# Patient Record
Sex: Female | Born: 1945
Health system: Southern US, Community
[De-identification: ages and names within clinical notes are randomized; demographics above are authoritative.]

## PROBLEM LIST (undated history)

## (undated) DIAGNOSIS — N814 Uterovaginal prolapse, unspecified: Principal | ICD-10-CM

## (undated) DIAGNOSIS — Z972 Presence of dental prosthetic device (complete) (partial): Secondary | ICD-10-CM

## (undated) DIAGNOSIS — C50919 Malignant neoplasm of unspecified site of unspecified female breast: Secondary | ICD-10-CM

## (undated) DIAGNOSIS — IMO0002 Reserved for concepts with insufficient information to code with codable children: Secondary | ICD-10-CM

## (undated) DIAGNOSIS — N393 Stress incontinence (female) (male): Secondary | ICD-10-CM

## (undated) DIAGNOSIS — Z974 Presence of external hearing-aid: Secondary | ICD-10-CM

## (undated) DIAGNOSIS — IMO0001 Reserved for inherently not codable concepts without codable children: Secondary | ICD-10-CM

## (undated) DIAGNOSIS — Z973 Presence of spectacles and contact lenses: Secondary | ICD-10-CM

## (undated) DIAGNOSIS — I10 Essential (primary) hypertension: Secondary | ICD-10-CM

## (undated) DIAGNOSIS — R339 Retention of urine, unspecified: Secondary | ICD-10-CM

## (undated) DIAGNOSIS — K649 Unspecified hemorrhoids: Secondary | ICD-10-CM

## (undated) DIAGNOSIS — K5730 Diverticulosis of large intestine without perforation or abscess without bleeding: Secondary | ICD-10-CM

## (undated) DIAGNOSIS — N3281 Overactive bladder: Secondary | ICD-10-CM

## (undated) DIAGNOSIS — R7303 Prediabetes: Secondary | ICD-10-CM

## (undated) DIAGNOSIS — C50511 Malignant neoplasm of lower-outer quadrant of right female breast: Principal | ICD-10-CM

## (undated) DIAGNOSIS — N950 Postmenopausal bleeding: Secondary | ICD-10-CM

## (undated) DIAGNOSIS — N182 Chronic kidney disease, stage 2 (mild): Secondary | ICD-10-CM

## (undated) DIAGNOSIS — E785 Hyperlipidemia, unspecified: Secondary | ICD-10-CM

## (undated) HISTORY — DX: Hypercalcemia: E83.52

## (undated) HISTORY — DX: Reserved for concepts with insufficient information to code with codable children: IMO0002

## (undated) HISTORY — DX: Reserved for inherently not codable concepts without codable children: IMO0001

## (undated) HISTORY — DX: Hyperlipidemia, unspecified: E78.5

## (undated) HISTORY — DX: Essential (primary) hypertension: I10

## (undated) HISTORY — DX: Uterovaginal prolapse, unspecified: N81.4

## (undated) HISTORY — DX: Malignant neoplasm of unspecified site of unspecified female breast: C50.919

## (undated) HISTORY — PX: TUBAL LIGATION: SHX77

## (undated) HISTORY — DX: Malignant neoplasm of lower-outer quadrant of right female breast: C50.511

---

## 1978-06-24 HISTORY — PX: BREAST SURGERY: SHX581

## 1998-12-05 ENCOUNTER — Other Ambulatory Visit: Admission: RE | Admit: 1998-12-05 | Discharge: 1998-12-05 | Payer: Self-pay | Admitting: Internal Medicine

## 1999-11-27 ENCOUNTER — Other Ambulatory Visit: Admission: RE | Admit: 1999-11-27 | Discharge: 1999-11-27 | Payer: Self-pay | Admitting: Internal Medicine

## 1999-11-29 ENCOUNTER — Encounter: Admission: RE | Admit: 1999-11-29 | Discharge: 1999-11-29 | Payer: Self-pay | Admitting: Internal Medicine

## 1999-11-29 ENCOUNTER — Encounter: Payer: Self-pay | Admitting: Internal Medicine

## 1999-12-03 ENCOUNTER — Ambulatory Visit (HOSPITAL_COMMUNITY): Admission: RE | Admit: 1999-12-03 | Discharge: 1999-12-03 | Payer: Self-pay | Admitting: Internal Medicine

## 2000-07-08 ENCOUNTER — Encounter: Admission: RE | Admit: 2000-07-08 | Discharge: 2000-10-06 | Payer: Self-pay | Admitting: Internal Medicine

## 2001-03-03 ENCOUNTER — Other Ambulatory Visit: Admission: RE | Admit: 2001-03-03 | Discharge: 2001-03-03 | Payer: Self-pay | Admitting: Internal Medicine

## 2003-01-25 ENCOUNTER — Encounter: Admission: RE | Admit: 2003-01-25 | Discharge: 2003-01-25 | Payer: Self-pay | Admitting: Family Medicine

## 2003-02-09 ENCOUNTER — Encounter: Admission: RE | Admit: 2003-02-09 | Discharge: 2003-02-09 | Payer: Self-pay | Admitting: Family Medicine

## 2003-02-09 ENCOUNTER — Encounter: Payer: Self-pay | Admitting: Family Medicine

## 2004-03-01 ENCOUNTER — Ambulatory Visit: Payer: Self-pay | Admitting: *Deleted

## 2004-05-11 ENCOUNTER — Ambulatory Visit: Payer: Self-pay | Admitting: Family Medicine

## 2004-07-03 ENCOUNTER — Ambulatory Visit: Payer: Self-pay | Admitting: Family Medicine

## 2006-04-16 ENCOUNTER — Ambulatory Visit: Payer: Self-pay | Admitting: Gastroenterology

## 2006-04-30 ENCOUNTER — Ambulatory Visit: Payer: Self-pay | Admitting: Gastroenterology

## 2012-06-23 ENCOUNTER — Telehealth: Payer: Self-pay | Admitting: Gastroenterology

## 2012-06-23 ENCOUNTER — Encounter: Payer: Self-pay | Admitting: Gastroenterology

## 2012-06-23 NOTE — Telephone Encounter (Signed)
Forward 8 pages from [facility] to Dr. Sheryn Bison for review on 06-23-12 ym

## 2012-07-14 ENCOUNTER — Encounter: Payer: Self-pay | Admitting: Gastroenterology

## 2012-07-14 ENCOUNTER — Ambulatory Visit (AMBULATORY_SURGERY_CENTER): Payer: BC Managed Care – PPO | Admitting: *Deleted

## 2012-07-14 VITALS — Ht 61.0 in | Wt 201.0 lb

## 2012-07-14 DIAGNOSIS — Z1211 Encounter for screening for malignant neoplasm of colon: Secondary | ICD-10-CM

## 2012-07-14 MED ORDER — MOVIPREP 100 G PO SOLR: ORAL | Status: DC

## 2012-07-27 ENCOUNTER — Ambulatory Visit (AMBULATORY_SURGERY_CENTER): Payer: BC Managed Care – PPO | Admitting: Gastroenterology

## 2012-07-27 ENCOUNTER — Encounter: Payer: Self-pay | Admitting: Gastroenterology

## 2012-07-27 VITALS — BP 148/85 | HR 63 | Temp 97.5°F | Resp 20 | Ht 61.0 in | Wt 201.0 lb

## 2012-07-27 DIAGNOSIS — K5730 Diverticulosis of large intestine without perforation or abscess without bleeding: Secondary | ICD-10-CM

## 2012-07-27 DIAGNOSIS — Z1211 Encounter for screening for malignant neoplasm of colon: Secondary | ICD-10-CM

## 2012-07-27 HISTORY — PX: COLONOSCOPY: SHX174

## 2012-07-27 MED ORDER — SODIUM CHLORIDE 0.9 % IV SOLN: 500.0000 mL | INTRAVENOUS | Status: DC

## 2012-07-27 NOTE — Patient Instructions (Addendum)
Discharge instructions given with verbal understanding. Handouts on diverticulosis and a high fiber diet given. Resume previous medications.YOU HAD AN ENDOSCOPIC PROCEDURE TODAY AT THE [facility]: Refer to the procedure report that was given to you for any specific questions about what was found during the examination.  If the procedure report does not answer your questions, please call your gastroenterologist to clarify.  If you requested that your care partner not be given the details of your procedure findings, then the procedure report has been included in a sealed envelope for you to review at your convenience later.  YOU SHOULD EXPECT: Some feelings of bloating in the abdomen. Passage of more gas than usual.  Walking can help get rid of the air that was put into your GI tract during the procedure and reduce the bloating. If you had a lower endoscopy (such as a colonoscopy or flexible sigmoidoscopy) you may notice spotting of blood in your stool or on the toilet paper. If you underwent a bowel prep for your procedure, then you may not have a normal bowel movement for a few days.  DIET: Your first meal following the procedure should be a light meal and then it is ok to progress to your normal diet.  A half-sandwich or bowl of soup is an example of a good first meal.  Heavy or fried foods are harder to digest and may make you feel nauseous or bloated.  Likewise meals heavy in dairy and vegetables can cause extra gas to form and this can also increase the bloating.  Drink plenty of fluids but you should avoid alcoholic beverages for 24 hours.  ACTIVITY: Your care partner should take you home directly after the procedure.  You should plan to take it easy, moving slowly for the rest of the day.  You can resume normal activity the day after the procedure however you should NOT DRIVE or use heavy machinery for 24 hours (because of the sedation medicines used during the test).    SYMPTOMS TO  REPORT IMMEDIATELY: A gastroenterologist can be reached at any hour.  During normal business hours, 8:30 AM to 5:00 PM Monday through Friday, call [phone].  After hours and on weekends, please call the GI answering service at [phone] who will take a message and have the physician on call contact you.   Following lower endoscopy (colonoscopy or flexible sigmoidoscopy):  Excessive amounts of blood in the stool  Significant tenderness or worsening of abdominal pains  Swelling of the abdomen that is new, acute  Fever of 100F or higher  FOLLOW UP: If any biopsies were taken you will be contacted by phone or by letter within the next 1-3 weeks.  Call your gastroenterologist if you have not heard about the biopsies in 3 weeks.  Our staff will call the home number listed on your records the next business day following your procedure to check on you and address any questions or concerns that you may have at that time regarding the information given to you following your procedure. This is a courtesy call and so if there is no answer at the home number and we have not heard from you through the emergency physician on call, we will assume that you have returned to your regular daily activities without incident.  SIGNATURES/CONFIDENTIALITY: You and/or your care partner have signed paperwork which will be entered into your electronic medical record.  These signatures attest to the fact that that the information above on your   After Visit Summary has been reviewed and is understood.  Full responsibility of the confidentiality of this discharge information lies with you and/or your care-partner.   

## 2012-07-27 NOTE — Progress Notes (Signed)
Patient did not experience any of the following events: a burn prior to discharge; a fall within the facility; wrong site/side/patient/procedure/implant event; or a hospital transfer or hospital admission upon discharge from the facility. (G8907) Patient did not have preoperative order for IV antibiotic SSI prophylaxis. (G8918)  

## 2012-07-27 NOTE — Op Note (Signed)
[facility] [address] [place], [address]   COLONOSCOPY PROCEDURE REPORT  PATIENT: Cynthia Barton, Cynthia Barton  MR#: [id number] BIRTHDATE: 1946/03/13 , 66  yrs. old GENDER: Female ENDOSCOPIST: Mardella Layman, MD, [facility] REFERRED BY: PROCEDURE DATE:  07/27/2012 PROCEDURE:   Colonoscopy, screening ASA CLASS:   Class II INDICATIONS:Average risk patient for colon cancer. MEDICATIONS: propofol (Diprivan) 150mg  IV  DESCRIPTION OF PROCEDURE:   After the risks and benefits and of the procedure were explained, informed consent was obtained.  A digital rectal exam revealed no abnormalities of the rectum.    The LB [id number]  endoscope was introduced through the anus and advanced to the cecum, which was identified by both the appendix and ileocecal valve .  The quality of the prep was excellent, using MoviPrep .  The instrument was then slowly withdrawn as the colon was fully examined.     COLON FINDINGS: Mild diverticulosis was noted in the descending colon and sigmoid colon.   The colon mucosa was otherwise normal. Retroflexed views revealed no abnormalities.     The scope was then withdrawn from the patient and the procedure completed.  COMPLICATIONS: There were no complications. ENDOSCOPIC IMPRESSION: 1.   Mild diverticulosis was noted in the descending colon and sigmoid colon 2.   The colon mucosa was otherwise normal...no polyps noted  RECOMMENDATIONS: 1.  Continue current medications 2.  High fiber diet 3.  Continue current colorectal screening recommendations for "routine risk" patients with a repeat colonoscopy in 10 years.   REPEAT EXAM:  XB:JYNWG Allyne Gee, MD  _______________________________ eSigned:  Mardella Layman, MD, [facility] 07/27/2012 10:41 AM

## 2012-07-28 ENCOUNTER — Telehealth: Payer: Self-pay | Admitting: *Deleted

## 2012-07-28 NOTE — Telephone Encounter (Signed)
  Follow up Call-  Call back number 07/27/2012  Post procedure Call Back phone  # [phone]  Permission to leave phone message Yes     Patient questions:  Message left to call us if necessary.

## 2014-06-24 DIAGNOSIS — N812 Incomplete uterovaginal prolapse: Secondary | ICD-10-CM

## 2014-06-24 DIAGNOSIS — N814 Uterovaginal prolapse, unspecified: Secondary | ICD-10-CM

## 2014-06-24 HISTORY — DX: Incomplete uterovaginal prolapse: N81.2

## 2014-06-24 HISTORY — DX: Uterovaginal prolapse, unspecified: N81.4

## 2014-08-23 DIAGNOSIS — C50511 Malignant neoplasm of lower-outer quadrant of right female breast: Secondary | ICD-10-CM

## 2014-08-23 HISTORY — DX: Malignant neoplasm of lower-outer quadrant of right female breast: C50.511

## 2014-09-01 ENCOUNTER — Other Ambulatory Visit: Payer: Self-pay | Admitting: Radiology

## 2014-09-02 ENCOUNTER — Other Ambulatory Visit: Payer: Self-pay | Admitting: Radiology

## 2014-09-02 DIAGNOSIS — C50911 Malignant neoplasm of unspecified site of right female breast: Secondary | ICD-10-CM

## 2014-09-06 ENCOUNTER — Encounter: Payer: Self-pay | Admitting: *Deleted

## 2014-09-06 ENCOUNTER — Telehealth: Payer: Self-pay | Admitting: *Deleted

## 2014-09-06 DIAGNOSIS — C50511 Malignant neoplasm of lower-outer quadrant of right female breast: Secondary | ICD-10-CM

## 2014-09-06 HISTORY — DX: Malignant neoplasm of lower-outer quadrant of right female breast: C50.511

## 2014-09-06 NOTE — Telephone Encounter (Signed)
Left vm for pt to return call to discuss [facility] on 3/16. [facility] gave pt appt time of 1230.

## 2014-09-07 ENCOUNTER — Encounter: Payer: Self-pay | Admitting: Hematology and Oncology

## 2014-09-07 ENCOUNTER — Ambulatory Visit
Admission: RE | Admit: 2014-09-07 | Discharge: 2014-09-07 | Disposition: A | Discharge status: Home / Self Care | Payer: BC Managed Care – PPO | Source: Ambulatory Visit | Attending: Radiation Oncology | Admitting: Radiation Oncology

## 2014-09-07 ENCOUNTER — Encounter: Payer: Self-pay | Admitting: Physical Therapy

## 2014-09-07 ENCOUNTER — Ambulatory Visit: Payer: BC Managed Care – PPO

## 2014-09-07 ENCOUNTER — Ambulatory Visit (HOSPITAL_BASED_OUTPATIENT_CLINIC_OR_DEPARTMENT_OTHER): Payer: BC Managed Care – PPO | Admitting: Hematology and Oncology

## 2014-09-07 ENCOUNTER — Other Ambulatory Visit: Payer: Self-pay | Admitting: General Surgery

## 2014-09-07 ENCOUNTER — Ambulatory Visit: Payer: BC Managed Care – PPO | Attending: General Surgery | Admitting: Physical Therapy

## 2014-09-07 ENCOUNTER — Other Ambulatory Visit (HOSPITAL_BASED_OUTPATIENT_CLINIC_OR_DEPARTMENT_OTHER): Payer: BC Managed Care – PPO

## 2014-09-07 VITALS — BP 155/81 | HR 69 | Temp 98.5°F | Resp 18 | Ht 59.5 in | Wt 195.8 lb

## 2014-09-07 DIAGNOSIS — Z170 Estrogen receptor positive status [ER+]: Secondary | ICD-10-CM

## 2014-09-07 DIAGNOSIS — Z803 Family history of malignant neoplasm of breast: Secondary | ICD-10-CM

## 2014-09-07 DIAGNOSIS — C50511 Malignant neoplasm of lower-outer quadrant of right female breast: Secondary | ICD-10-CM | POA: Insufficient documentation

## 2014-09-07 DIAGNOSIS — C50919 Malignant neoplasm of unspecified site of unspecified female breast: Secondary | ICD-10-CM

## 2014-09-07 DIAGNOSIS — R293 Abnormal posture: Secondary | ICD-10-CM

## 2014-09-07 HISTORY — DX: Malignant neoplasm of unspecified site of unspecified female breast: C50.919

## 2014-09-07 LAB — CBC WITH DIFFERENTIAL/PLATELET
BASO%: 0.4 % (ref 0.0–2.0)
BASOS ABS: 0 10*3/uL (ref 0.0–0.1)
EOS%: 0.8 % (ref 0.0–7.0)
Eosinophils Absolute: 0.1 10*3/uL (ref 0.0–0.5)
HEMATOCRIT: 41.4 % (ref 34.8–46.6)
HEMOGLOBIN: 13 g/dL (ref 11.6–15.9)
LYMPH#: 2.2 10*3/uL (ref 0.9–3.3)
LYMPH%: 31.3 % (ref 14.0–49.7)
MCH: 27.4 pg (ref 25.1–34.0)
MCHC: 31.4 g/dL — ABNORMAL LOW (ref 31.5–36.0)
MCV: 87 fL (ref 79.5–101.0)
MONO#: 0.6 10*3/uL (ref 0.1–0.9)
MONO%: 8 % (ref 0.0–14.0)
NEUT#: 4.2 10*3/uL (ref 1.5–6.5)
NEUT%: 59.5 % (ref 38.4–76.8)
PLATELETS: 176 10*3/uL (ref 145–400)
RBC: 4.76 10*6/uL (ref 3.70–5.45)
RDW: 13.9 % (ref 11.2–14.5)
WBC: 7 10*3/uL (ref 3.9–10.3)

## 2014-09-07 LAB — COMPREHENSIVE METABOLIC PANEL (CC13)
ALBUMIN: 3.6 g/dL (ref 3.5–5.0)
ALK PHOS: 51 U/L (ref 40–150)
ALT: 22 U/L (ref 0–55)
ANION GAP: 8 meq/L (ref 3–11)
AST: 27 U/L (ref 5–34)
BILIRUBIN TOTAL: 0.44 mg/dL (ref 0.20–1.20)
BUN: 11.3 mg/dL (ref 7.0–26.0)
CO2: 27 meq/L (ref 22–29)
Calcium: 10.6 mg/dL — ABNORMAL HIGH (ref 8.4–10.4)
Chloride: 107 mEq/L (ref 98–109)
Creatinine: 0.9 mg/dL (ref 0.6–1.1)
EGFR: 81 mL/min/{1.73_m2} — ABNORMAL LOW (ref >90)
Glucose: 98 mg/dl (ref 70–140)
POTASSIUM: 4.3 meq/L (ref 3.5–5.1)
SODIUM: 142 meq/L (ref 136–145)
TOTAL PROTEIN: 7.2 g/dL (ref 6.4–8.3)

## 2014-09-07 NOTE — Progress Notes (Signed)
Cynthia Barton is a very pleasant 69 y.o. female from [place], [place] with newly diagnosed grade 2 invasive ductal carcinoma and DCIS of the right breast.  Biopsy results revealed the tumor's hormone status as ER positive, PR positive, and HER2/neu negative. Ki67 is 30%.  She presents today with her daughter to the [facility]) for treatment consideration and recommendations from the breast surgeon, radiation oncologist, and medical oncologist.     I briefly met with Cynthia Barton and her daughter during her [facility] visit today. We discussed the purpose of the Survivorship Clinic, which will include monitoring for recurrence, coordinating completion of age and gender-appropriate cancer screenings, promotion of overall wellness, as well as managing potential late/long-term side effects of anti-cancer treatments.    The treatment plan for Cynthia Barton will likely include surgery,  radiation therapy, and anti-estrogen therapy.  She will meet with the Genetics Counselor due to her family history of breast cancer. As of today, the intent of treatment for Cynthia Barton is cure, therefore she will be eligible for the Survivorship Clinic upon her completion of treatment.  Her survivorship care plan (SCP) document will be drafted and updated throughout the course of her treatment trajectory. She will receive the SCP in an office visit with myself in the Survivorship Clinic once she has completed treatment.   Cynthia Barton was encouraged to ask questions and all questions were answered to her satisfaction.  She was given my business card and encouraged to contact me with any concerns regarding survivorship.  I look forward to participating in her care.   Mike Craze, NP [place] [phone]

## 2014-09-07 NOTE — Progress Notes (Signed)
  Radiation Oncology         [phone]) [phone] ________________________________  Initial outpatient Consultation - Date: 9/47/0962   Name: Cynthia Barton MRN: [id number]   DOB: 1945-10-06  REFERRING PHYSICIAN: Autumn Messing III, MD  DIAGNOSIS: No diagnosis found.  STAGE: Breast cancer of lower-outer quadrant of right female breast   Staging form: Breast, AJCC 7th Edition     Clinical stage from 09/07/2014: Stage IA (T1c, N0, M0) - Unsigned       Staging comments: Staged at breast conference on 3.16.16  [place] is a 69 y.o. female  Who underwent a screening mammogram which showed right architectural distortion.  This measured 1.5 cm on ultrasound.  A biopsy showed Grade 2 invasive ductal carcinoma and DCIS.  This was ER100% PR30% Ki6730% Her2-. Her MRI is scheduled for tomorrow. She is accompanied by her son and daughter. She is interested in breast conservation. She had menarche at 79. She used HRT for 3 years but quit many years ago. She is GxP3 with her first birth at 50.   PREVIOUS RADIATION THERAPY: No  Past medical, social and family history were reviewed in the electronic chart. Review of symptoms was reviewed in the electronic chart. Medications were reviewed in the electronic chart.   PHYSICAL EXAM: There were no vitals filed for this visit.. . Pleasant female. No distress. Bruising over the outer quadrant of the right breast. No palpable abnormalities. No palpable abnormalities of the left breast or axillary, supraclavicular or cervical adenopathy. Cranial nerves are intact.   IMPRESSION: T1cN0 Invasive Ductal Carcinoma of the Right Breast  PLAN :I spoke to the patient today regarding her diagnosis and options for treatment. We discussed the equivalence in terms of survival and local failure between mastectomy and breast conservation. We discussed the role of radiation in decreasing local failures in patients who undergo lumpectomy. We discussed the process of  simulation and the placement tattoos. We discussed 4-6 weeks of treatment as an outpatient. We discussed the possibility of asymptomatic lung damage. We discussed the low likelihood of secondary malignancies. We discussed the possible side effects including but not limited to skin redness, fatigue, permanent skin darkening, and breast swelling. We discussed the process of simulation and the placement of tattoos. I will see her back after her Oncotype score. I did clarify with her that if she needed chemotherapy this would be performed prior to radiation. She met with medical oncology as well as a member of our patient family support team and our physical therapist.  Dr. Marlou Starks will decide whether she will proceed with her MRI tomorrow or not.   I spent 40 minutes face to face with the patient and more than 50% of that time was spent in counseling and/or coordination of care.   ------------------------------------------------  Thea Silversmith, MD

## 2014-09-07 NOTE — Therapy (Signed)
[place], [place], [address] Phone: [phone]   Fax:  [phone]  Physical Therapy Evaluation  Patient Details  Name: Cynthia Barton MRN: [id number] Date of Birth: 1946/05/05 Referring Provider:  Jovita Kussmaul, MD  Encounter Date: 09/07/2014      PT End of Session - 09/07/14 1554    Visit Number 1   Number of Visits 1   PT Start Time 1410   PT Stop Time 1445   PT Time Calculation (min) 35 min   Activity Tolerance Patient tolerated treatment well   Behavior During Therapy [facility] for tasks assessed/performed      Past Medical History  Diagnosis Date  . Hyperlipidemia   . Hypertension   . Breast cancer of lower-outer quadrant of right female breast 09/06/2014    Past Surgical History  Procedure Laterality Date  . Tubal ligation      There were no vitals filed for this visit.  Visit Diagnosis:  Abnormal posture - Plan: PT plan of care cert/re-cert  Carcinoma of lower outer quadrant of right breast - Plan: PT plan of care cert/re-cert      Subjective Assessment - 09/07/14 1546    Symptoms Patient was seen today for a baseline assessment of her newly diagnosed right breast cancer.   Pertinent History Diagnosed 09/01/14 with right ER/PR positive, HER2 negative breast cancer with a Ki67 of 30% measuring 1.5 cm in size.   Patient Stated Goals Learn post op shoulder ROM goals and lymphedema risk reduction practices.   Currently in Pain? No/denies            [facility] PT Assessment - 09/07/14 0001    Assessment   Medical Diagnosis Right breast cancer   Onset Date 09/01/14   Precautions   Precautions Other (comment)  Active breast cancer   Restrictions   Weight Bearing Restrictions No   Balance Screen   Has the patient fallen in the past 6 months No   Has the patient had a decrease in activity level because of a fear of falling?  No   Is the patient reluctant to leave their home because of  a fear of falling?  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[employer], taking out trash, vacuuming   Leisure She does not exercise   Cognition   Overall Cognitive Status Within Functional Limits for tasks assessed   Posture/Postural Control   Posture/Postural Control Postural limitations   Postural Limitations Rounded Shoulders;Forward head   ROM / Strength   AROM / PROM / Strength AROM;Strength   AROM   AROM Assessment Site Shoulder   Right/Left Shoulder Right;Left   Right Shoulder Extension 63 Degrees   Right Shoulder Flexion 131 Degrees   Right Shoulder ABduction 133 Degrees   Right Shoulder Internal Rotation 64 Degrees   Right Shoulder External Rotation 80 Degrees   Left Shoulder Extension 65 Degrees   Left Shoulder Flexion 120 Degrees   Left Shoulder ABduction 113 Degrees   Left Shoulder Internal Rotation 62 Degrees   Left Shoulder External Rotation 79 Degrees   Strength   Overall Strength Within functional limits for tasks performed  LYMPHEDEMA/ONCOLOGY QUESTIONNAIRE - 09/07/14 1551    Type   Cancer Type Right breast   Lymphedema Assessments   Lymphedema Assessments Upper extremities   Right Upper Extremity Lymphedema   10 cm Proximal to Olecranon Process 37 cm   Olecranon Process 28.2 cm   10 cm Proximal to Ulnar Styloid Process 26 cm   Just Proximal to Ulnar Styloid Process 17 cm   Across Hand at [employer] 20 cm   At [place] of 2nd Digit 6.6 cm   Left Upper Extremity Lymphedema   10 cm Proximal to Olecranon Process 37.4 cm   Olecranon Process 27.6 cm   10 cm Proximal to Ulnar Styloid Process 25.7 cm   Just Proximal to Ulnar Styloid Process 17.2 cm   Across Hand at [employer] 19.2 cm   At [place] of 2nd Digit 6.5 cm        Patient was instructed today in a home exercise program today for post op shoulder range of motion. These included active assist shoulder flexion in sitting, scapular retraction, wall walking with shoulder abduction, and hands behind head external rotation.  She was encouraged to do these twice a day, holding 3 seconds and repeating 5 times when permitted by her physician.         PT Education - 09/07/14 1553    Education provided Yes   Education Details Post op shoulder ROM HEP and lymphedema risk reduction   Person(s) Educated Patient;Child(ren)   Methods Explanation;Demonstration;Handout   Comprehension Verbalized understanding;Returned demonstration              Breast Clinic Goals - 09/07/14 1556    Patient will be able to verbalize understanding of pertinent lymphedema risk reduction practices relevant to her diagnosis specifically related to skin care.   Time 1   Period Days   Status Achieved   Patient will be able to return demonstrate and/or verbalize understanding of the post-op home exercise program related to regaining shoulder range of motion.   Time 1   Period Days   Status Achieved   Patient will be able to verbalize understanding of the importance of attending the postoperative After Breast Cancer Class for further lymphedema risk reduction education and therapeutic exercise.   Time 1   Period Days   Status Achieved              Plan - 09/07/14 1554    Clinical Impression Statement Patient was seen today for a baseline assessment of her newly diagnosed right breast cancer.  She is planning to have a right lumpectomy with a sentinel node biopsy followed by radiation and anti-estrogen therapy.  She will likely benefit from PT after surgery to regain shoulder ROM and reduce lymphedema risk.   Pt will benefit from skilled therapeutic intervention in order to improve on the following deficits  Decreased range of motion;Increased edema;Decreased knowledge of precautions;Impaired UE functional use;Pain;Decreased strength   Rehab Potential Good   Clinical Impairments Affecting Rehab Potential none   PT Frequency One time visit   PT Treatment/Interventions Patient/family education;Therapeutic exercise   Consulted and Agree with Plan of Care Patient;Family member/caregiver   Family Member Consulted son and daughter     Patient will follow up at outpatient cancer rehab if needed following surgery.  If the patient requires physical therapy at that time, a specific plan will be dictated and sent to the referring physician for approval. The patient was educated today on appropriate basic range of motion  exercises to begin post operatively and the importance of attending the After Breast Cancer class following surgery.  Patient was educated today on lymphedema risk reduction practices as it pertains to recommendations that will benefit the patient immediately following surgery.  She verbalized good understanding.  No additional physical therapy is indicated at this time.       Problem List Patient Active Problem List   Diagnosis Date Noted  . Breast cancer of lower-outer quadrant of right female breast 09/06/2014    Annia Friendly, PT 09/07/2014, 4:03 PM  [place], [place], [address] Phone: [phone]   Fax:  [phone]

## 2014-09-07 NOTE — Progress Notes (Signed)
[place] NOTE  Patient Care Team: Glendale Chard, MD as PCP - General (Internal Medicine) Autumn Messing III, MD as Consulting Physician (General Surgery) Nicholas Lose, MD as Consulting Physician (Hematology and Oncology) Thea Silversmith, MD as Consulting Physician (Radiation Oncology) Rockwell Germany, RN as Registered Nurse Mauro Kaufmann, RN as Registered Nurse  CHIEF COMPLAINTS/PURPOSE OF CONSULTATION:  Newly diagnosed breast cancer  HISTORY OF PRESENTING ILLNESS:  Cynthia Barton 69 y.o. female is here because of recent diagnosis of right-sided breast cancer. She had a routine screening mammogram the detected architectural distortion. This led to an ultrasound which revealed a 1.5 Sinemet a mass at 8:00 position. She underwent a biopsy that revealed invasive ductal carcinoma grade 2 that was ER/PR positive HER-2 negative with a Ki-67 of 30% along with that DCIS was also noted. She was presented at the multidisciplinary tumor board and she is here and M.D. see clinic to discuss a treatment plan. She does not have any pain discomfort breast discharge or skin changes associated with this diagnosis.   I reviewed her records extensively and collaborated the history with the patient.  SUMMARY OF ONCOLOGIC HISTORY:   Breast cancer of lower-outer quadrant of right female breast   09/01/2014 Initial Diagnosis Right breast invasive ductal carcinoma with DCIS, grade 2, ER 100%, P of 30%, Ki-67 30%, HER-2 negative ratio 1    In terms of breast cancer risk profile:  She menarched at early age of 81 and went to menopause at age 49  She had 2 pregnancy, her first child was born at age 39  She has received birth control pills for approximately 5 years.  She was never exposed to fertility medications or hormone replacement therapy.  She has has family history of Breast/GYN/GI cancer; maternal aunt breast cancer age 71  MEDICAL HISTORY:  Past Medical History  Diagnosis Date  .  Hyperlipidemia   . Hypertension   . Breast cancer of lower-outer quadrant of right female breast 09/06/2014    SURGICAL HISTORY: Past Surgical History  Procedure Laterality Date  . Tubal ligation      SOCIAL HISTORY: History   Social History  . Marital Status: Divorced    Spouse Name: N/A  . Number of Children: N/A  . Years of Education: N/A   Occupational History  . Not on file.   Social History Main Topics  . Smoking status: Never Smoker   . Smokeless tobacco: Never Used  . Alcohol Use: No     Comment: OCC. WINE  . Drug Use: No  . Sexual Activity: Not on file   Other Topics Concern  . Not on file   Social History Narrative    FAMILY HISTORY: Family History  Problem Relation Age of Onset  . Colon cancer Neg Hx   . Heart disease Father   . Melanoma Mother     ALLERGIES:  has No Known Allergies.  MEDICATIONS:  Current Outpatient Prescriptions  Medication Sig Dispense Refill  . lisinopril (PRINIVIL,ZESTRIL) 10 MG tablet Take 10 mg by mouth daily.    . Pitavastatin Calcium 4 MG TABS Take by mouth daily.    Marland Kitchen UNABLE TO FIND daily. Detox tea yogi    . UNABLE TO FIND daily. Vinegar cleanse     No current facility-administered medications for this visit.    REVIEW OF SYSTEMS:   Constitutional: Denies fevers, chills or abnormal night sweats Eyes: Denies blurriness of vision, double vision or watery eyes Ears, nose, mouth, throat,  and face: Denies mucositis or sore throat Respiratory: Denies cough, dyspnea or wheezes Cardiovascular: Denies palpitation, chest discomfort or lower extremity swelling Gastrointestinal:  Denies nausea, heartburn or change in bowel habits Skin: Denies abnormal skin rashes Lymphatics: Denies new lymphadenopathy or easy bruising Neurological:Denies numbness, tingling or new weaknesses Behavioral/Psych: Mood is stable, no new changes  Breast:  Denies any palpable lumps or discharge All other systems were reviewed with the patient and  are negative.  PHYSICAL EXAMINATION: ECOG PERFORMANCE STATUS: 0 - Asymptomatic  Filed Vitals:   09/07/14 1308  BP: 155/81  Pulse: 69  Temp: 98.5 F (36.9 C)  Resp: 18   Filed Weights   09/07/14 1308  Weight: 195 lb 12.8 oz (88.814 kg)    GENERAL:alert, no distress and comfortable SKIN: skin color, texture, turgor are normal, no rashes or significant lesions EYES: normal, conjunctiva are pink and non-injected, sclera clear OROPHARYNX:no exudate, no erythema and lips, buccal mucosa, and tongue normal  NECK: supple, thyroid normal size, non-tender, without nodularity LYMPH:  no palpable lymphadenopathy in the cervical, axillary or inguinal LUNGS: clear to auscultation and percussion with normal breathing effort HEART: regular rate & rhythm and no murmurs and no lower extremity edema ABDOMEN:abdomen soft, non-tender and normal bowel sounds Musculoskeletal:no cyanosis of digits and no clubbing  PSYCH: alert & oriented x 3 with fluent speech NEURO: no focal motor/sensory deficits BREAST: No palpable nodules in breast. No palpable axillary or supraclavicular lymphadenopathy (exam performed in the presence of a chaperone)   LABORATORY DATA:  I have reviewed the data as listed Lab Results  Component Value Date   WBC 7.0 09/07/2014   HGB 13.0 09/07/2014   HCT 41.4 09/07/2014   MCV 87.0 09/07/2014   PLT 176 09/07/2014   Lab Results  Component Value Date   NA 142 09/07/2014   K 4.3 09/07/2014   CO2 27 09/07/2014    RADIOGRAPHIC STUDIES: I have personally reviewed the radiological reports and agreed with the findings in the report. Results are summarized as above  ASSESSMENT AND PLAN:  Breast cancer of lower-outer quadrant of right female breast Right breast invasive ductal carcinoma grade 21.5 cm by ultrasound at 8:00 position with DCIS: ER 100%, PR 30%, HER-2 negative ratio 1, Ki-67 is 30% detected as a screening mammogram architectural distortion. MRI breast to be done on  09/08/2014  Pathology and radiology review: Discussed with the patient, the details of pathology including the type of breast cancer,the clinical staging, the significance of ER, PR and HER-2/neu receptors and the implications for treatment. After reviewing the pathology in detail, we proceeded to discuss the different treatment options between surgery, radiation, chemotherapy, antiestrogen therapies.  Recommendation based multidisciplinary tumor board: 1. Breast conserving surgery followed by 2. Oncotype DX testing to determine chemotherapy benefit followed by 3. Adjuvant radiation therapy followed by 4. Antiestrogen therapy once daily for 5 years  Oncotype DX counseling:I discussed Oncotype DX test. I explained to the patient that this is a 21 gene panel to evaluate patient tumors DNA to calculate recurrence score. This would help determine whether patient has high risk or intermediate risk or low risk breast cancer. She understands that if her tumor was found to be high risk, she would benefit from systemic chemotherapy. If low risk, no need of chemotherapy. If she was found to be intermediate risk, we would need to evaluate the score as well as other risk factors and determine if an abbreviated chemotherapy may be of benefit.  Return to clinic after  surgery to discuss adjuvant treatment plan.   All questions were answered. The patient knows to call the clinic with any problems, questions or concerns.    Rulon Eisenmenger, MD 3:21 PM

## 2014-09-07 NOTE — Progress Notes (Signed)
Note created by Dr. Gudena during office visit, copy to patient,original to scan. 

## 2014-09-07 NOTE — Progress Notes (Signed)
Checked in new pt with no financial concerns prior to seeing the dr.  Informed pt if chemo is part of her treatment Raquel will call her ins to see if auth is req and will obtain it if it is as well as contact foundations that offer copay assistance for chemo if needed.  She has Raquel's card for any billing questions or concerns. °

## 2014-09-07 NOTE — Patient Instructions (Signed)

## 2014-09-07 NOTE — Assessment & Plan Note (Addendum)
Right breast invasive ductal carcinoma grade 21.5 cm by ultrasound at 8:00 position with DCIS: ER 100%, PR 30%, HER-2 negative ratio 1, Ki-67 is 30% detected as a screening mammogram architectural distortion.  Pathology and radiology review: Discussed with the patient, the details of pathology including the type of breast cancer,the clinical staging, the significance of ER, PR and HER-2/neu receptors and the implications for treatment. After reviewing the pathology in detail, we proceeded to discuss the different treatment options between surgery, radiation, chemotherapy, antiestrogen therapies.  Recommendation based multidisciplinary tumor board: 1. Breast conserving surgery followed by 2. Oncotype DX testing to determine chemotherapy benefit followed by 3. Adjuvant radiation therapy followed by 4. Antiestrogen therapy once daily for 5 years  Oncotype DX counseling:I discussed Oncotype DX test. I explained to the patient that this is a 21 gene panel to evaluate patient tumors DNA to calculate recurrence score. This would help determine whether patient has high risk or intermediate risk or low risk breast cancer. She understands that if her tumor was found to be high risk, she would benefit from systemic chemotherapy. If low risk, no need of chemotherapy. If she was found to be intermediate risk, we would need to evaluate the score as well as other risk factors and determine if an abbreviated chemotherapy may be of benefit.  Return to clinic after surgery to discuss adjuvant treatment plan.

## 2014-09-08 ENCOUNTER — Ambulatory Visit
Admission: RE | Admit: 2014-09-08 | Discharge: 2014-09-08 | Disposition: A | Discharge status: Home / Self Care | Payer: BC Managed Care – PPO | Source: Ambulatory Visit | Attending: Radiology | Admitting: Radiology

## 2014-09-08 DIAGNOSIS — C50911 Malignant neoplasm of unspecified site of right female breast: Secondary | ICD-10-CM

## 2014-09-08 MED ORDER — GADOBENATE DIMEGLUMINE 529 MG/ML IV SOLN
17.0000 mL | Freq: Once | INTRAVENOUS | Status: AC | PRN
  Administered 2014-09-08: 17 mL via INTRAVENOUS

## 2014-09-09 ENCOUNTER — Encounter: Payer: Self-pay | Admitting: General Practice

## 2014-09-09 NOTE — Progress Notes (Signed)
[place] Psychosocial Distress Screening [place] with Ms Ehrler in [facility] to introduce [place] team/resources, and to review distress screen per protocol.  The patient scored a 3 on the Psychosocial Distress Thermometer which indicates mild distress. Also assessed for distress and other psychosocial needs.   ONCBCN DISTRESS SCREENING 09/09/2014  Screening Type Initial Screening  Distress experienced in past week (1-10) 3  Family Problem type Children  Emotional problem type Nervousness/Anxiety;Adjusting to illness  Physical Problem type Pain;Loss of appetitie;Constipation/diarrhea;Changes in urination;Tingling hands/feet  Referral to support programs Yes  Other Spiritual Care, counseling interns   Per pt, her two biggest worries are her children and facing the possibility of pain related to dx/tx.  She was pleased to learn of the centrality and scope of support resources at [facility], which helped reduce anxiety while increasing a sense of being cared for.    Follow up needed: No.  Pt has print material about support resources and team members' contact info.  Please also page as needs arise.  Thank you.  [place], [place]

## 2014-09-12 ENCOUNTER — Telehealth: Payer: Self-pay | Admitting: *Deleted

## 2014-09-12 NOTE — Telephone Encounter (Signed)
Spoke to pt daughter concerning [place] from 09/07/14. Denies questions regarding dx or treatment care plan. Will have mother (pt) return call when she returns from work. Contact information given.

## 2014-09-13 ENCOUNTER — Telehealth: Payer: Self-pay | Admitting: *Deleted

## 2014-09-13 NOTE — Telephone Encounter (Signed)
Spoke to pt concerning [place] from 09/07/14. Pt denies questions or concerns regarding dx or treatment care plan. Encourage pt to call with needs. Received verbal understanding. Contact information given.

## 2014-09-16 ENCOUNTER — Encounter: Payer: Self-pay | Admitting: *Deleted

## 2014-09-19 ENCOUNTER — Telehealth: Payer: Self-pay | Admitting: Hematology and Oncology

## 2014-09-19 NOTE — Telephone Encounter (Signed)
Left message for patient re 4/18 appointment. Also confirmed 3/30 appointments and mailed schedule.

## 2014-09-21 ENCOUNTER — Ambulatory Visit (HOSPITAL_BASED_OUTPATIENT_CLINIC_OR_DEPARTMENT_OTHER): Payer: BC Managed Care – PPO | Admitting: Genetic Counselor

## 2014-09-21 ENCOUNTER — Other Ambulatory Visit: Payer: BC Managed Care – PPO

## 2014-09-21 ENCOUNTER — Encounter: Payer: Self-pay | Admitting: Genetic Counselor

## 2014-09-21 DIAGNOSIS — Z315 Encounter for genetic counseling: Secondary | ICD-10-CM | POA: Diagnosis not present

## 2014-09-21 DIAGNOSIS — C50511 Malignant neoplasm of lower-outer quadrant of right female breast: Secondary | ICD-10-CM | POA: Diagnosis not present

## 2014-09-21 DIAGNOSIS — Z803 Family history of malignant neoplasm of breast: Secondary | ICD-10-CM

## 2014-09-21 DIAGNOSIS — C50919 Malignant neoplasm of unspecified site of unspecified female breast: Secondary | ICD-10-CM | POA: Insufficient documentation

## 2014-09-21 NOTE — Progress Notes (Signed)
REFERRING PROVIDER: Glendale Chard, MD [place], [place] [address]   Nicholas Lose, MD  PRIMARY PROVIDER:  [name], MD  PRIMARY REASON FOR VISIT:  1. Breast cancer of lower-outer quadrant of right female breast      HISTORY OF PRESENT ILLNESS:   Ms. Llorens, a 69 y.o. female, was seen for a [place] cancer genetics consultation at the request of Dr. Baird Cancer due to a personal history of cancer.  Ms. Arrambide presents to clinic today to discuss the possibility of a hereditary predisposition to cancer, genetic testing, and to further clarify her future cancer risks, as well as potential cancer risks for family members.   In 2016, at the age of 31, Ms. Furgerson was diagnosed with invasive ductal carcinoma of the right breast. This will be treated with lumpectomy and radiation. The tumor is ER+/PR+/Her2-.    CANCER HISTORY:    Breast cancer of lower-outer quadrant of right female breast   09/01/2014 Initial Diagnosis Right breast invasive ductal carcinoma with DCIS, grade 2, ER 100%, P of 30%, Ki-67 30%, HER-2 negative ratio 1     HORMONAL RISK FACTORS:  Menarche was at age 29.  First live birth at age 56.  OCP use for approximately 5 years.  Ovaries intact: yes.  Hysterectomy: no.  Menopausal status: postmenopausal.  HRT use: 0 years. Colonoscopy: yes; normal. Mammogram within the last year: yes. Number of breast biopsies: 2. Up to date with pelvic exams:  yes. Any excessive radiation exposure in the past:  no  Past Medical History  Diagnosis Date  . Hyperlipidemia   . Hypertension   . Breast cancer of lower-outer quadrant of right female breast 09/06/2014  . Breast cancer 2016    ER+/PR+/Her2-    Past Surgical History  Procedure Laterality Date  . Tubal ligation      History   Social History  . Marital Status: Divorced    Spouse Name: N/A  . Number of Children: N/A  . Years of Education: N/A   Social History Main Topics  . Smoking  status: Never Smoker   . Smokeless tobacco: Never Used  . Alcohol Use: No     Comment: OCC. WINE  . Drug Use: No  . Sexual Activity: Not on file   Other Topics Concern  . None   Social History Narrative     FAMILY HISTORY:  We obtained a detailed, 4-generation family history.  Significant diagnoses are listed below: Family History  Problem Relation Age of Onset  . Colon cancer Neg Hx   . Heart disease Father   . Melanoma Mother   . Kidney failure Sister 34  . Heart disease Maternal Uncle    The patient has two sisters and one brother.  One sister died of renal failure at 28.  Her mother had melanoma on her leg in her 23s and died at 20.  There is no other reported cancer in the family. Patient's maternal ancestors are of [place] and [place] American descent, and paternal ancestors are of African American descent. There is no reported Ashkenazi Jewish ancestry. There is no known consanguinity.  GENETIC COUNSELING ASSESSMENT: Sonya Sappington is a 69 y.o. female with a personal history of cancer which somewhat suggestive of a sporadic disease. We, therefore, discussed and recommended the following at today's visit.   DISCUSSION: We discussed with Ms. Stmartin that the family history is not highly consistent with a familial hereditary cancer syndrome, and we feel she is at  low risk to harbor a gene mutation associated with such a condition. Thus, we did not recommend any genetic testing, at this time, and recommended Ms. Ignasiak continue to follow the cancer screening guidelines given by her primary healthcare provider.  Her daughter is concerned about her father's side of the family (Ms. Harral' husbands side). We discussed that based on the fact that there are 3 men with prostate cancer and one woman with breast cancer, she may be at increased risk.  She should talk with her PCP to see if she can get a referral based on her family history.  PLAN: We encouraged Ms. Schoenfelder to remain in contact  with cancer genetics annually so that we can continuously update the family history and inform her of any changes in cancer genetics and testing that may be of benefit for this family.   Ms.  Liberati's questions were answered to her satisfaction today. Our contact information was provided should additional questions or concerns arise. Thank you for the referral and allowing [place] to share in the care of your patient.   Karen P. Florene Glen, [place], [facility] Certified Genetic Counselor [email or web address] phone: [phone]  The patient was seen for a total of 60 minutes in face-to-face genetic counseling.  This patient was discussed with Drs. Magrinat, Lindi Adie and/or Burr Medico who agrees with the above.    _______________________________________________________________________ For Office Staff:  Number of people involved in session: 2 Was an Intern/ student involved with case: yes

## 2014-09-27 ENCOUNTER — Other Ambulatory Visit: Payer: Self-pay

## 2014-09-28 ENCOUNTER — Encounter (HOSPITAL_BASED_OUTPATIENT_CLINIC_OR_DEPARTMENT_OTHER): Payer: Self-pay | Admitting: *Deleted

## 2014-09-28 ENCOUNTER — Other Ambulatory Visit: Payer: Self-pay

## 2014-09-28 ENCOUNTER — Encounter (HOSPITAL_BASED_OUTPATIENT_CLINIC_OR_DEPARTMENT_OTHER)
Admission: RE | Admit: 2014-09-28 | Discharge: 2014-09-28 | Disposition: A | Discharge status: Home / Self Care | Payer: BC Managed Care – PPO | Source: Ambulatory Visit | Attending: General Surgery | Admitting: General Surgery

## 2014-09-28 DIAGNOSIS — K219 Gastro-esophageal reflux disease without esophagitis: Secondary | ICD-10-CM | POA: Diagnosis not present

## 2014-09-28 DIAGNOSIS — I10 Essential (primary) hypertension: Secondary | ICD-10-CM | POA: Diagnosis not present

## 2014-09-28 DIAGNOSIS — E780 Pure hypercholesterolemia: Secondary | ICD-10-CM | POA: Diagnosis not present

## 2014-09-28 DIAGNOSIS — C50511 Malignant neoplasm of lower-outer quadrant of right female breast: Secondary | ICD-10-CM | POA: Diagnosis present

## 2014-09-28 NOTE — Progress Notes (Signed)
Pt here for ekg with daughter-hibiclens given-all preop instructions reviewed-labs were done 09/07/14

## 2014-09-30 ENCOUNTER — Ambulatory Visit (HOSPITAL_BASED_OUTPATIENT_CLINIC_OR_DEPARTMENT_OTHER)
Admission: RE | Admit: 2014-09-30 | Discharge: 2014-09-30 | Disposition: A | Discharge status: Home / Self Care | Payer: BC Managed Care – PPO | Source: Ambulatory Visit | Attending: General Surgery | Admitting: General Surgery

## 2014-09-30 ENCOUNTER — Encounter (HOSPITAL_BASED_OUTPATIENT_CLINIC_OR_DEPARTMENT_OTHER)
Admission: RE | Disposition: A | Discharge status: Home / Self Care | Payer: Self-pay | Source: Ambulatory Visit | Attending: General Surgery

## 2014-09-30 ENCOUNTER — Ambulatory Visit (HOSPITAL_COMMUNITY)
Admission: RE | Admit: 2014-09-30 | Discharge: 2014-09-30 | Disposition: A | Discharge status: Home / Self Care | Payer: BC Managed Care – PPO | Source: Ambulatory Visit | Attending: General Surgery | Admitting: General Surgery

## 2014-09-30 ENCOUNTER — Ambulatory Visit (HOSPITAL_BASED_OUTPATIENT_CLINIC_OR_DEPARTMENT_OTHER): Payer: BC Managed Care – PPO | Admitting: Certified Registered"

## 2014-09-30 ENCOUNTER — Encounter (HOSPITAL_BASED_OUTPATIENT_CLINIC_OR_DEPARTMENT_OTHER): Payer: Self-pay

## 2014-09-30 DIAGNOSIS — C50511 Malignant neoplasm of lower-outer quadrant of right female breast: Secondary | ICD-10-CM | POA: Diagnosis not present

## 2014-09-30 DIAGNOSIS — K219 Gastro-esophageal reflux disease without esophagitis: Secondary | ICD-10-CM | POA: Insufficient documentation

## 2014-09-30 DIAGNOSIS — E780 Pure hypercholesterolemia: Secondary | ICD-10-CM | POA: Insufficient documentation

## 2014-09-30 DIAGNOSIS — I10 Essential (primary) hypertension: Secondary | ICD-10-CM | POA: Insufficient documentation

## 2014-09-30 HISTORY — DX: Presence of dental prosthetic device (complete) (partial): Z97.2

## 2014-09-30 HISTORY — PX: OTHER SURGICAL HISTORY: SHX169

## 2014-09-30 HISTORY — DX: Presence of spectacles and contact lenses: Z97.3

## 2014-09-30 HISTORY — PX: BREAST LUMPECTOMY WITH RADIOACTIVE SEED AND SENTINEL LYMPH NODE BIOPSY: SHX6550

## 2014-09-30 LAB — POCT HEMOGLOBIN-HEMACUE: Hemoglobin: 14.3 g/dL (ref 12.0–15.0)

## 2014-09-30 SURGERY — BREAST LUMPECTOMY WITH RADIOACTIVE SEED AND SENTINEL LYMPH NODE BIOPSY
Anesthesia: General | Site: Breast | Laterality: Right

## 2014-09-30 MED ORDER — TECHNETIUM TC 99M SULFUR COLLOID FILTERED: 1.0000 | Freq: Once | INTRAVENOUS | Status: AC | PRN

## 2014-09-30 MED ORDER — PROPOFOL 10 MG/ML IV BOLUS
INTRAVENOUS | Status: DC | PRN
  Administered 2014-09-30: 120 mg via INTRAVENOUS

## 2014-09-30 MED ORDER — PROMETHAZINE HCL 25 MG/ML IJ SOLN
6.2500 mg | INTRAMUSCULAR | Status: DC | PRN
Start: 2014-09-30 — End: 2014-09-30

## 2014-09-30 MED ORDER — OXYCODONE-ACETAMINOPHEN 5-325 MG PO TABS: 1.0000 | ORAL_TABLET | ORAL | Status: DC | PRN

## 2014-09-30 MED ORDER — MIDAZOLAM HCL 2 MG/2ML IJ SOLN
1.0000 mg | INTRAMUSCULAR | Status: DC | PRN
  Administered 2014-09-30: 2 mg via INTRAVENOUS

## 2014-09-30 MED ORDER — MIDAZOLAM HCL 2 MG/2ML IJ SOLN
INTRAMUSCULAR | Status: AC
  Filled 2014-09-30: qty 2

## 2014-09-30 MED ORDER — METHYLENE BLUE 1 % INJ SOLN
INTRAMUSCULAR | Status: AC
  Filled 2014-09-30: qty 10

## 2014-09-30 MED ORDER — CEFAZOLIN SODIUM-DEXTROSE 2-3 GM-% IV SOLR
2.0000 g | INTRAVENOUS | Status: AC
  Administered 2014-09-30: 2 g via INTRAVENOUS

## 2014-09-30 MED ORDER — BUPIVACAINE-EPINEPHRINE (PF) 0.5% -1:200000 IJ SOLN
INTRAMUSCULAR | Status: DC | PRN
  Administered 2014-09-30: 30 mL via PERINEURAL

## 2014-09-30 MED ORDER — ONDANSETRON HCL 4 MG/2ML IJ SOLN
INTRAMUSCULAR | Status: DC | PRN
  Administered 2014-09-30: 4 mg via INTRAVENOUS

## 2014-09-30 MED ORDER — SCOPOLAMINE 1 MG/3DAYS TD PT72
1.0000 | MEDICATED_PATCH | TRANSDERMAL | Status: DC
  Administered 2014-09-30: 1.5 mg via TRANSDERMAL

## 2014-09-30 MED ORDER — PROPOFOL 10 MG/ML IV EMUL
INTRAVENOUS | Status: AC
  Filled 2014-09-30: qty 50

## 2014-09-30 MED ORDER — SODIUM CHLORIDE 0.9 % IJ SOLN
INTRAMUSCULAR | Status: AC
  Filled 2014-09-30: qty 10

## 2014-09-30 MED ORDER — CHLORHEXIDINE GLUCONATE 4 % EX LIQD: 1.0000 "application " | Freq: Once | CUTANEOUS | Status: DC

## 2014-09-30 MED ORDER — HYDROMORPHONE HCL 1 MG/ML IJ SOLN
0.2500 mg | INTRAMUSCULAR | Status: DC | PRN
Start: 2014-09-30 — End: 2014-09-30

## 2014-09-30 MED ORDER — CEFAZOLIN SODIUM-DEXTROSE 2-3 GM-% IV SOLR
INTRAVENOUS | Status: AC
  Filled 2014-09-30: qty 50

## 2014-09-30 MED ORDER — FENTANYL CITRATE 0.05 MG/ML IJ SOLN
50.0000 ug | INTRAMUSCULAR | Status: DC | PRN
  Administered 2014-09-30: 100 ug via INTRAVENOUS

## 2014-09-30 MED ORDER — DEXAMETHASONE SODIUM PHOSPHATE 4 MG/ML IJ SOLN
INTRAMUSCULAR | Status: DC | PRN
  Administered 2014-09-30: 4 mg via INTRAVENOUS

## 2014-09-30 MED ORDER — EPHEDRINE SULFATE 50 MG/ML IJ SOLN
INTRAMUSCULAR | Status: DC | PRN
  Administered 2014-09-30 (×3): 10 mg via INTRAVENOUS

## 2014-09-30 MED ORDER — BUPIVACAINE HCL (PF) 0.25 % IJ SOLN
INTRAMUSCULAR | Status: AC
  Filled 2014-09-30: qty 30

## 2014-09-30 MED ORDER — OXYCODONE HCL 5 MG PO TABS
ORAL_TABLET | ORAL | Status: AC
  Filled 2014-09-30: qty 1

## 2014-09-30 MED ORDER — LACTATED RINGERS IV SOLN
INTRAVENOUS | Status: DC
  Administered 2014-09-30 (×2): via INTRAVENOUS

## 2014-09-30 MED ORDER — PROPOFOL 10 MG/ML IV BOLUS
INTRAVENOUS | Status: AC
  Filled 2014-09-30: qty 20

## 2014-09-30 MED ORDER — BUPIVACAINE HCL (PF) 0.25 % IJ SOLN
INTRAMUSCULAR | Status: DC | PRN
  Administered 2014-09-30: 10 mL

## 2014-09-30 MED ORDER — OXYCODONE HCL 5 MG/5ML PO SOLN: 5.0000 mg | Freq: Once | ORAL | Status: AC | PRN

## 2014-09-30 MED ORDER — FENTANYL CITRATE 0.05 MG/ML IJ SOLN
INTRAMUSCULAR | Status: AC
  Filled 2014-09-30: qty 2

## 2014-09-30 MED ORDER — SUCCINYLCHOLINE CHLORIDE 20 MG/ML IJ SOLN
INTRAMUSCULAR | Status: AC
  Filled 2014-09-30: qty 1

## 2014-09-30 MED ORDER — FENTANYL CITRATE 0.05 MG/ML IJ SOLN
INTRAMUSCULAR | Status: AC
  Filled 2014-09-30: qty 6

## 2014-09-30 MED ORDER — FENTANYL CITRATE 0.05 MG/ML IJ SOLN
INTRAMUSCULAR | Status: DC | PRN
  Administered 2014-09-30: 50 ug via INTRAVENOUS

## 2014-09-30 MED ORDER — OXYCODONE HCL 5 MG PO TABS
5.0000 mg | ORAL_TABLET | Freq: Once | ORAL | Status: AC | PRN
  Administered 2014-09-30: 5 mg via ORAL

## 2014-09-30 SURGICAL SUPPLY — 40 items
APPLIER CLIP 9.375 MED OPEN (MISCELLANEOUS) ×2
APR CLP MED 9.3 20 MLT OPN (MISCELLANEOUS) ×1
BLADE SURG 15 STRL LF DISP TIS (BLADE) ×1 IMPLANT
BLADE SURG 15 STRL SS (BLADE) ×2
CANISTER SUC SOCK COL 7IN (MISCELLANEOUS) IMPLANT
CANISTER SUCT 1200ML W/VALVE (MISCELLANEOUS) IMPLANT
CHLORAPREP W/TINT 26ML (MISCELLANEOUS) ×2 IMPLANT
CLIP APPLIE 9.375 MED OPEN (MISCELLANEOUS) ×1 IMPLANT
COVER BACK TABLE 60X90IN (DRAPES) ×2 IMPLANT
COVER MAYO STAND STRL (DRAPES) ×2 IMPLANT
COVER PROBE W GEL 5X96 (DRAPES) ×2 IMPLANT
DECANTER SPIKE VIAL GLASS SM (MISCELLANEOUS) IMPLANT
DEVICE DUBIN W/COMP PLATE 8390 (MISCELLANEOUS) ×2 IMPLANT
DRAPE LAPAROSCOPIC ABDOMINAL (DRAPES) ×2 IMPLANT
DRAPE UTILITY XL STRL (DRAPES) ×2 IMPLANT
ELECT COATED BLADE 2.86 ST (ELECTRODE) ×2 IMPLANT
ELECT REM PT RETURN 9FT ADLT (ELECTROSURGICAL) ×2
ELECTRODE REM PT RTRN 9FT ADLT (ELECTROSURGICAL) ×1 IMPLANT
GLOVE BIO SURGEON STRL SZ 6.5 (GLOVE) ×2 IMPLANT
GLOVE BIO SURGEON STRL SZ7.5 (GLOVE) ×3 IMPLANT
GOWN STRL REUS W/ TWL LRG LVL3 (GOWN DISPOSABLE) ×2 IMPLANT
GOWN STRL REUS W/TWL LRG LVL3 (GOWN DISPOSABLE) ×4
KIT MARKER MARGIN INK (KITS) ×2 IMPLANT
LIQUID BAND (GAUZE/BANDAGES/DRESSINGS) ×2 IMPLANT
NDL SAFETY ECLIPSE 18X1.5 (NEEDLE) IMPLANT
NEEDLE HYPO 18GX1.5 SHARP (NEEDLE)
NEEDLE HYPO 25X1 1.5 SAFETY (NEEDLE) ×2 IMPLANT
NS IRRIG 1000ML POUR BTL (IV SOLUTION) IMPLANT
PACK BASIN DAY SURGERY FS (CUSTOM PROCEDURE TRAY) ×2 IMPLANT
PENCIL BUTTON HOLSTER BLD 10FT (ELECTRODE) ×2 IMPLANT
SLEEVE SCD COMPRESS KNEE MED (MISCELLANEOUS) ×2 IMPLANT
SPONGE LAP 18X18 X RAY DECT (DISPOSABLE) ×2 IMPLANT
SUT MON AB 4-0 PC3 18 (SUTURE) ×4 IMPLANT
SUT SILK 2 0 SH (SUTURE) IMPLANT
SUT VICRYL 3-0 CR8 SH (SUTURE) ×2 IMPLANT
SYR CONTROL 10ML LL (SYRINGE) ×2 IMPLANT
TOWEL OR 17X24 6PK STRL BLUE (TOWEL DISPOSABLE) ×2 IMPLANT
TOWEL OR NON WOVEN STRL DISP B (DISPOSABLE) ×2 IMPLANT
TUBE CONNECTING 20X1/4 (TUBING) IMPLANT
YANKAUER SUCT BULB TIP NO VENT (SUCTIONS) IMPLANT

## 2014-09-30 NOTE — Op Note (Signed)
09/30/2014  9:14 AM  PATIENT:  Cynthia Barton  69 y.o. female  PRE-OPERATIVE DIAGNOSIS:  Right Breast Cancer  POST-OPERATIVE DIAGNOSIS:  Right Breast Cancer  PROCEDURE:  Procedure(s): RIGHT BREAST LUMPECTOMY WITH RADIOACTIVE SEED AND SENTINEL LYMPH NODE BIOPSY (Right)  SURGEON:  Surgeon(s) and Role:    * Jovita Kussmaul, MD - Primary  PHYSICIAN ASSISTANT:   ASSISTANTS: none   ANESTHESIA:   general  EBL:  Total I/O In: 1200 [I.V.:1200] Out: -   BLOOD ADMINISTERED:none  DRAINS: none   LOCAL MEDICATIONS USED:  MARCAINE     SPECIMEN:  Source of Specimen:  right breast tissue and sentinel node with additional anterior and inferior margins  DISPOSITION OF SPECIMEN:  PATHOLOGY  COUNTS:  YES  TOURNIQUET:  * No tourniquets in log *  DICTATION: .Dragon Dictation  After informed consent was obtained the patient was brought to the operating room and placed in the supine position on the operating room table. After adequate induction of general anesthesia the patient's right breast, chest, and axillary area were prepped with ChloraPrep, allowed to dry, and draped in usual sterile manner. Earlier in the day the patient underwent injection of 1 mCi of technetium sulfur colloid in the subareolar position on the right. Previously the patient had a I-125 radioactive seed placed in the lower outer quadrant of the right breast to mark the area of the breast cancer. At this point attention was first turned to the right axilla. With the neoprobe probe set on technetium we could identify a hot spot in the right axilla. A small transversely oriented incision was made overlying the hot spot with a 15 blade knife. The incision was carried through the skin and subcutaneous tissue sharply with electrocautery until the axilla was entered. Using the neoprobe to direct blunt hemostat dissection we were able to identify a hot lymph node. This was excised sharply with the electrocautery and the lymphatics were  controlled with clips. Ex vivo counts on this node were approximately 150. There were no other hot or palpable lymph nodes in the right axilla. The area was infiltrated with quarter percent Marcaine. The deep layer was closed with interrupted 3-0 Vicryl stitches. The skin was then closed with a running 4-0 Monocryl subcuticular stitch. Attention was then turned to the right breast. The neoprobe was set to I-125. The area of radioactivity was identified in the lower outer quadrant near the inframammary fold. An incision was made with a 15 blade knife along the inframammary fold close to the tumor. The incision was carried through the skin and subcutaneous tissue sharply with electrocautery. The dissection was carried along the chest wall initially beneath the tumor. A circular portion of breast tissue was then excised sharply with the electrocautery around the area of radioactivity that was identified with the neoprobe. Once the specimen was removed the radioactive seed was confirmed in the specimen. It was no residual radioactivity of iodine in the breast. The specimen was marked with the appropriate paint colors. A specimen radiograph showed the clip and seed to be in the specimen. In examining the specimen and looking at the mammogram image and I decided to take an additional inferior and anterior margin and these were sent separately. Hemostasis was achieved using the Bovie electrocautery. The wound was irrigated with saline and infiltrated with quarter percent Marcaine. The deep layer of the wound was closed with interrupted 3-0 Vicryl stitches. The skin was then closed with interrupted 4-0 Monocryl subcuticular stitches. Dermabond dressings were applied.  The patient tolerated the procedure well. At the end of the case all needle sponge and instrument counts were correct. The patient was then awakened and taken to recovery in stable condition.  PLAN OF CARE: Discharge to home after PACU  PATIENT DISPOSITION:   PACU - hemodynamically stable.   Delay start of Pharmacological VTE agent (>24hrs) due to surgical blood loss or risk of bleeding: not applicable

## 2014-09-30 NOTE — H&P (Signed)
Cynthia Barton 08/15/9796 10:21 AM Location: [facility] Patient #: [id number] DOB: 04-Oct-1945 Undefined / Language: Cynthia Barton / Race: Undefined Female  History of Present Illness Cynthia Barton [name] MD; 09/07/2014 4:10 PM) The patient is a 69 year old female who presents with breast cancer. We are asked to see the patient in consultation by Dr. [name] to evaluate her for a right breast cancer. The patient is a 69 year old black female who recently went for a routine screening mammogram. It has been 3 years since her last mammogram. She was found to have an abnormality in the lower outer quadrant of the right breast. This measured 1.5 cm by ultrasound. It was biopsied and came back as a grade 2 invasive ductal cancer. She was ER and PR positive and HER-2 negative with a Ki-67 of 30%. She denies any breast pain or discharge from her nipple. She does not take any hormone replacement. Her MRI is scheduled for tomorrow.   Other Problems Cynthia Barton [place], [place]; 09/07/2014 10:21 AM) Bladder Problems Breast Cancer Gastric Ulcer Gastroesophageal Reflux Disease High blood pressure Hypercholesterolemia Kidney Stone Lump In Breast  Past Surgical History Cynthia Barton [place], RMA; 09/07/2014 10:21 AM) Breast Biopsy Right. Oral Surgery  Diagnostic Studies History Cynthia Barton Cynthia Barton, [place]; 09/07/2014 10:21 AM) Colonoscopy 1-5 years ago Mammogram within last year Pap Smear 1-5 years ago  Social History Cynthia Barton Cynthia Barton, RMA; 09/07/2014 10:21 AM) Alcohol use Occasional alcohol use. Caffeine use Carbonated beverages, Coffee, Tea. Tobacco use Never smoker.  Family History Cynthia Barton Cynthia Barton, [place]; 09/07/2014 10:21 AM) Breast Cancer Family Members In General. Diabetes Mellitus Sister. Heart Disease Father. Heart disease in female family member before age 32 Hypertension Brother, Father, Mother, Sister. Kidney Disease Sister. Melanoma Mother.  Pregnancy / Birth History Cynthia Barton Cynthia Barton, [place];  09/07/2014 10:21 AM) Age at menarche 67 years. Age of menopause 104-50 Contraceptive History Oral contraceptives. Gravida 2 Irregular periods Maternal age 67-25 Para 2  Review of Systems Cynthia Barton [place] RMA; 09/07/2014 10:21 AM) General Not Present- Appetite Loss, Chills, Fatigue, Fever, Night Sweats, Weight Gain and Weight Loss. Skin Not Present- Change in Wart/Mole, Dryness, Hives, Jaundice, New Lesions, Non-Healing Wounds, Rash and Ulcer. HEENT Present- Wears glasses/contact lenses. Not Present- Earache, Hearing Loss, Hoarseness, Nose Bleed, Oral Ulcers, Ringing in the Ears, Seasonal Allergies, Sinus Pain, Sore Throat, Visual Disturbances and Yellow Eyes. Respiratory Not Present- Bloody sputum, Chronic Cough, Difficulty Breathing, Snoring and Wheezing. Breast Not Present- Breast Mass, Breast Pain, Nipple Discharge and Skin Changes. Cardiovascular Not Present- Chest Pain, Difficulty Breathing Lying Down, Leg Cramps, Palpitations, Rapid Heart Rate, Shortness of Breath and Swelling of Extremities. Gastrointestinal Not Present- Abdominal Pain, Bloating, Bloody Stool, Change in Bowel Habits, Chronic diarrhea, Constipation, Difficulty Swallowing, Excessive gas, Gets full quickly at meals, Hemorrhoids, Indigestion, Nausea, Rectal Pain and Vomiting. Female Genitourinary Present- Frequency and Urgency. Not Present- Nocturia, Painful Urination and Pelvic Pain. Musculoskeletal Not Present- Back Pain, Joint Pain, Joint Stiffness, Muscle Pain, Muscle Weakness and Swelling of Extremities. Neurological Not Present- Decreased Memory, Fainting, Headaches, Numbness, Seizures, Tingling, Tremor, Trouble walking and Weakness. Psychiatric Not Present- Anxiety, Bipolar, Change in Sleep Pattern, Depression, Fearful and Frequent crying. Endocrine Not Present- Cold Intolerance, Excessive Hunger, Hair Changes, Heat Intolerance, Hot flashes and New Diabetes. Hematology Not Present- Easy Bruising, Excessive  bleeding, Gland problems, HIV and Persistent Infections.   Physical Exam Cynthia Barton [name] MD; 09/07/2014 4:11 PM) General Mental Status-Alert. General Appearance-Consistent with stated age. Hydration-Well hydrated. Voice-Normal.  Head and Neck Head-normocephalic, atraumatic with no lesions or palpable masses. Trachea-midline.  Thyroid Gland Characteristics - normal size and consistency.  Eye Eyeball - Bilateral-Extraocular movements intact. Sclera/Conjunctiva - Bilateral-No scleral icterus.  Chest and Lung Exam Chest and lung exam reveals -quiet, even and easy respiratory effort with no use of accessory muscles and on auscultation, normal breath sounds, no adventitious sounds and normal vocal resonance. Inspection Chest Wall - Normal. Back - normal.  Breast Note: There is no palpable mass in either breast. There is no palpable axillary, supraclavicular, or cervical lymphadenopathy.   Cardiovascular Cardiovascular examination reveals -normal heart sounds, regular rate and rhythm with no murmurs and normal pedal pulses bilaterally.  Abdomen Inspection Inspection of the abdomen reveals - No Hernias. Skin - Scar - no surgical scars. Palpation/Percussion Palpation and Percussion of the abdomen reveal - Soft, Non Tender, No Rebound tenderness, No Rigidity (guarding) and No hepatosplenomegaly. Auscultation Auscultation of the abdomen reveals - Bowel sounds normal.  Neurologic Neurologic evaluation reveals -alert and oriented x 3 with no impairment of recent or remote memory. Mental Status-Normal.  Musculoskeletal Normal Exam - Left-Upper Extremity Strength Normal and Lower Extremity Strength Normal. Normal Exam - Right-Upper Extremity Strength Normal and Lower Extremity Strength Normal.  Lymphatic Head & Neck  General Head & Neck Lymphatics: Bilateral - Description - Normal. Axillary  General Axillary Region: Bilateral - Description - Normal.  Tenderness - Non Tender. Femoral & Inguinal  Generalized Femoral & Inguinal Lymphatics: Bilateral - Description - Normal. Tenderness - Non Tender.    Assessment & Plan Cynthia Barton [name] MD; 09/07/2014 4:12 PM) PRIMARY CANCER OF LOWER OUTER QUADRANT OF RIGHT FEMALE BREAST (174.5  C50.511) Impression: The patient appears to have a small stage I cancer in the lower outer quadrant of the right breast. I have discussed with her in detail the different options for treatment and at this point she favors breast conservation. I think this is a very reasonable way to treat her breast cancer. She would also be a good candidate for sentinel node mapping. I have discussed with her in detail the risks and benefits of the operation to remove the cancer as well as some of the technical aspects and she understands and wishes to proceed. We will plan for a right breast radioactive seed localized lumpectomy and sentinel node mapping as long as her MRI is similar to the imaging that we already have.     Signed by Luella Cook, MD (09/07/2014 4:13 PM)

## 2014-09-30 NOTE — Interval H&P Note (Signed)
History and Physical Interval Note:  03/01/3531 9:92 AM  Cynthia Barton  has presented today for surgery, with the diagnosis of Right Breast Cancer  The various methods of treatment have been discussed with the patient and family. After consideration of risks, benefits and other options for treatment, the patient has consented to  Procedure(s): RIGHT BREAST LUMPECTOMY WITH RADIOACTIVE SEED AND SENTINEL LYMPH NODE BIOPSY (Right) as a surgical intervention .  The patient's history has been reviewed, patient examined, no change in status, stable for surgery.  I have reviewed the patient's chart and labs.  Questions were answered to the patient's satisfaction.     TOTH III,Lindsay Straka S

## 2014-09-30 NOTE — Transfer of Care (Signed)
Immediate Anesthesia Transfer of Care Note  Patient: [employer]  Procedure(s) Performed: Procedure(s): RIGHT BREAST LUMPECTOMY WITH RADIOACTIVE SEED AND SENTINEL LYMPH NODE BIOPSY (Right)  Patient Location: PACU  Anesthesia Type:GA combined with regional for post-op pain  Level of Consciousness: awake, alert , oriented and patient cooperative  Airway & Oxygen Therapy: Patient Spontanous Breathing and Patient connected to face mask oxygen  Post-op Assessment: Report given to RN and Post -op Vital signs reviewed and stable  Post vital signs: Reviewed and stable  Last Vitals:  Filed Vitals:   09/30/14 0618  BP: 161/80  Temp: 36.7 C  Resp: 18    Complications: No apparent anesthesia complications

## 2014-09-30 NOTE — Anesthesia Preprocedure Evaluation (Addendum)
Anesthesia Evaluation  Patient identified by MRN, date of birth, ID band Patient awake    Reviewed: Allergy & Precautions, NPO status   History of Anesthesia Complications Negative for: history of anesthetic complications  Airway        Dental   Pulmonary neg pulmonary ROS,          Cardiovascular hypertension, Pt. on medications     Neuro/Psych negative neurological ROS  negative psych ROS   GI/Hepatic negative GI ROS, Neg liver ROS,   Endo/Other  Morbid obesity  Renal/GU negative Renal ROS     Musculoskeletal   Abdominal   Peds  Hematology   Anesthesia Other Findings   Reproductive/Obstetrics                            Anesthesia Physical Anesthesia Plan  ASA: II  Anesthesia Plan: General   Post-op Pain Management:    Induction: Intravenous  Airway Management Planned: Oral ETT  Additional Equipment:   Intra-op Plan:   Post-operative Plan: Extubation in OR  Informed Consent: I have reviewed the patients History and Physical, chart, labs and discussed the procedure including the risks, benefits and alternatives for the proposed anesthesia with the patient or authorized representative who has indicated his/her understanding and acceptance.   Dental advisory given  Plan Discussed with: CRNA, Anesthesiologist and Surgeon  Anesthesia Plan Comments:         Anesthesia Quick Evaluation

## 2014-09-30 NOTE — Progress Notes (Signed)
Assisted Dr. Singer with right, ultrasound guided, pectoralis block. Side rails up, monitors on throughout procedure. See vital signs in flow sheet. Tolerated Procedure well. °

## 2014-09-30 NOTE — Discharge Instructions (Signed)

## 2014-09-30 NOTE — Addendum Note (Signed)
Addendum  created 09/30/14 1206 by Duane Boston, MD   Modules edited: Anesthesia Blocks and Procedures, Anesthesia Medication Administration, Clinical Notes   Clinical Notes:  File: [id number]

## 2014-09-30 NOTE — Anesthesia Procedure Notes (Addendum)
Procedure Name: Intubation Date/Time: 09/30/2014 7:47 AM Performed by: BLOCKER, TIMOTHY D Pre-anesthesia Checklist: Patient identified, Emergency Drugs available, Suction available and Patient being monitored Patient Re-evaluated:Patient Re-evaluated prior to inductionOxygen Delivery Method: Circle System Utilized Preoxygenation: Pre-oxygenation with 100% oxygen Intubation Type: IV induction Ventilation: Mask ventilation without difficulty Laryngoscope Size: Mac and 3 Grade View: Grade I Tube type: Oral Tube size: 7.0 mm Number of attempts: 1 Airway Equipment and Method: Stylet Placement Confirmation: ETT inserted through vocal cords under direct vision,  positive ETCO2 and breath sounds checked- equal and bilateral Secured at: 21 cm Tube secured with: Tape Dental Injury: Teeth and Oropharynx as per pre-operative assessment    Anesthesia Regional Block:  Pectoralis block  Pre-Anesthetic Checklist: ,, timeout performed, Correct Patient, Correct Site, Correct Laterality, Correct Procedure, Correct Position, site marked, Risks and benefits discussed,  Surgical consent,  Pre-op evaluation,  At surgeon's request and post-op pain management  Laterality: Right  Prep: chloraprep       Needles:  Injection technique: Single-shot  Needle Type: Echogenic Stimulator Needle     Needle Length: 10cm 10 cm Needle Gauge: 21 and 21 G    Additional Needles:  Procedures: ultrasound guided (picture in chart) Pectoralis block Narrative:  Start time: 09/30/2014 7:02 AM End time: 09/30/2014 7:12 AM Injection made incrementally with aspirations every 5 mL.  Performed by: Personally

## 2014-09-30 NOTE — Anesthesia Postprocedure Evaluation (Signed)
Anesthesia Post Note  Patient: Estate manager/land agent) Performed: Procedure(s) (LRB): RIGHT BREAST LUMPECTOMY WITH RADIOACTIVE SEED AND SENTINEL LYMPH NODE BIOPSY (Right)  Anesthesia type: general  Patient location: PACU  Post pain: Pain level controlled  Post assessment: Patient's Cardiovascular Status Stable  Last Vitals:  Filed Vitals:   09/30/14 0945  BP: 121/61  Pulse: 75  Temp:   Resp: 23    Post vital signs: Reviewed and stable  Level of consciousness: sedated  Complications: No apparent anesthesia complications

## 2014-10-03 NOTE — Addendum Note (Signed)
Addendum  created 10/03/14 1141 by Itza Maniaci W Ana Woodroof, CRNA   Modules edited: Charges VN

## 2014-10-10 ENCOUNTER — Encounter: Payer: Self-pay | Admitting: *Deleted

## 2014-10-10 ENCOUNTER — Ambulatory Visit (HOSPITAL_BASED_OUTPATIENT_CLINIC_OR_DEPARTMENT_OTHER): Payer: BC Managed Care – PPO | Admitting: Hematology and Oncology

## 2014-10-10 VITALS — BP 174/83 | HR 68 | Temp 98.1°F | Resp 18 | Ht 60.0 in | Wt 196.9 lb

## 2014-10-10 DIAGNOSIS — C50511 Malignant neoplasm of lower-outer quadrant of right female breast: Secondary | ICD-10-CM | POA: Diagnosis not present

## 2014-10-10 DIAGNOSIS — Z170 Estrogen receptor positive status [ER+]: Secondary | ICD-10-CM | POA: Diagnosis not present

## 2014-10-10 NOTE — Progress Notes (Signed)
Patient Care Team: Glendale Chard, MD as PCP - General (Internal Medicine) Autumn Messing III, MD as Consulting Physician (General Surgery) Nicholas Lose, MD as Consulting Physician (Hematology and Oncology) Thea Silversmith, MD as Consulting Physician (Radiation Oncology) Rockwell Germany, RN as Registered Nurse Mauro Kaufmann, RN as Registered Nurse Holley Bouche, NP as Nurse Practitioner (Nurse Practitioner)  DIAGNOSIS: Breast cancer of lower-outer quadrant of right female breast   Staging form: Breast, AJCC 7th Edition     Clinical stage from 09/07/2014: Stage IA (T1c, N0, M0) - Unsigned       Staging comments: Staged at breast conference on 3.16.16      Pathologic stage from 10/03/2014: Stage IA (T1c, N0, cM0) - Signed by Enid Cutter, MD on 10/06/2014       Staging comments: Staged on final lumpectomy specimen by Dr. Saralyn Pilar    SUMMARY OF ONCOLOGIC HISTORY:   Breast cancer of lower-outer quadrant of right female breast   09/01/2014 Initial Diagnosis Right breast invasive ductal carcinoma with DCIS, grade 2, ER 100%, P of 30%, Ki-67 30%, HER-2 negative ratio 1   09/30/2014 Surgery Right lumpectomy: 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 29%, HER-2 negative ratio 1, Ki-67 21%, T1 cN0 M0 stage IA    CHIEF COMPLIANT: Follow-up of recent breast surgery  INTERVAL HISTORY: Cynthia Barton is a 69 year old with above-mentioned history of right-sided breast cancer one underwent lumpectomy and is here today for discussion regarding surgery. She has done very well from surgery standpoint with minimal discomfort. She denies any fevers or chills. Denies any redness. She is accompanied by her daughter to discuss the final pathology report.  REVIEW OF SYSTEMS:   Constitutional: Denies fevers, chills or abnormal weight loss Eyes: Denies blurriness of vision Ears, nose, mouth, throat, and face: Denies mucositis or sore throat Respiratory: Denies cough, dyspnea or wheezes Cardiovascular: Denies  palpitation, chest discomfort or lower extremity swelling Gastrointestinal:  Denies nausea, heartburn or change in bowel habits Skin: Denies abnormal skin rashes Lymphatics: Denies new lymphadenopathy or easy bruising Neurological:Denies numbness, tingling or new weaknesses Behavioral/Psych: Mood is stable, no new changes  Breast: Slight soreness from recent surgery All other systems were reviewed with the patient and are negative.  I have reviewed the past medical history, past surgical history, social history and family history with the patient and they are unchanged from previous note.  ALLERGIES:  has No Known Allergies.  MEDICATIONS:  Current Outpatient Prescriptions  Medication Sig Dispense Refill  . lisinopril (PRINIVIL,ZESTRIL) 10 MG tablet Take 10 mg by mouth daily.    Marland Kitchen oxyCODONE-acetaminophen (ROXICET) 5-325 MG per tablet Take 1-2 tablets by mouth every 4 (four) hours as needed. 50 tablet 0  . Pitavastatin Calcium 4 MG TABS Take by mouth daily.     No current facility-administered medications for this visit.    PHYSICAL EXAMINATION: ECOG PERFORMANCE STATUS: 1 - Symptomatic but completely ambulatory  Filed Vitals:   10/10/14 0829  BP: 174/83  Pulse:   Temp:   Resp:    Filed Weights   10/10/14 0828  Weight: 196 lb 14.4 oz (89.313 kg)    GENERAL:alert, no distress and comfortable SKIN: skin color, texture, turgor are normal, no rashes or significant lesions EYES: normal, Conjunctiva are pink and non-injected, sclera clear OROPHARYNX:no exudate, no erythema and lips, buccal mucosa, and tongue normal  NECK: supple, thyroid normal size, non-tender, without nodularity LYMPH:  no palpable lymphadenopathy in the cervical, axillary or inguinal LUNGS: clear to auscultation  and percussion with normal breathing effort HEART: regular rate & rhythm and no murmurs and no lower extremity edema ABDOMEN:abdomen soft, non-tender and normal bowel sounds Musculoskeletal:no  cyanosis of digits and no clubbing  NEURO: alert & oriented x 3 with fluent speech, no focal motor/sensory deficits  LABORATORY DATA:  I have reviewed the data as listed   Chemistry      Component Value Date/Time   NA 142 09/07/2014 1237   K 4.3 09/07/2014 1237   CO2 27 09/07/2014 1237   BUN 11.3 09/07/2014 1237   CREATININE 0.9 09/07/2014 1237      Component Value Date/Time   CALCIUM 10.6* 09/07/2014 1237   ALKPHOS 51 09/07/2014 1237   AST 27 09/07/2014 1237   ALT 22 09/07/2014 1237   BILITOT 0.44 09/07/2014 1237       Lab Results  Component Value Date   WBC 7.0 09/07/2014   HGB 14.3 09/30/2014   HCT 41.4 09/07/2014   MCV 87.0 09/07/2014   PLT 176 09/07/2014   NEUTROABS 4.2 09/07/2014    ASSESSMENT & PLAN:  Breast cancer of lower-outer quadrant of right female breast Right lumpectomy 09/30/2014: 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 29%, HER-2 negative ratio 1, Ki-67 21%, T1 cN0 M0 stage IA  Pathology review: I discussed the pathology report in great detail with the patient provided her with a copy of this report.  Recommendation: 1. Oncotype DX testing to determine chemotherapy benefit followed by 2. Adjuvant radiation therapy followed by 3. Antiestrogen therapy once daily for 5 years  We will call the patient with the results of Oncotype DX and arrange follow-up plan based on that result. If she has high intermediate risk or high risk I will see her back immediately to discuss adjuvant chemotherapy.  No orders of the defined types were placed in this encounter.   The patient has a good understanding of the overall plan. she agrees with it. She will call with any problems that may develop before her next visit here.   Rulon Eisenmenger, MD

## 2014-10-10 NOTE — Assessment & Plan Note (Addendum)
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 29%, HER-2 negative ratio 1, Ki-67 21%, T1 cN0 M0 stage IA  Pathology review: I discussed the pathology report in great detail with the patient provided her with a copy of this report.  Recommendation: 1. Oncotype DX testing to determine chemotherapy benefit followed by 2. Adjuvant radiation therapy followed by 3. Antiestrogen therapy once daily for 5 years  We will call the patient with the results of Oncotype DX and arrange follow-up plan based on that result. If she has high intermediate risk or high risk I will see her back immediately to discuss adjuvant chemotherapy.   

## 2014-10-10 NOTE — Progress Notes (Signed)
Ordered oncotype per Dr. Lindi Adie. Faxed requisition to pathology and confirmed receipt with [facility].  Faxed PAC to [employer] and [place].

## 2014-10-10 NOTE — Progress Notes (Signed)
Met with pt post-op with Dr. Geralyn Flash visit. Relate she is doing well. Gave instructions on letting surgical glue fall off without taking it off. Received verbal understanding.  Encourage pt to call with needs or questions.

## 2014-10-20 ENCOUNTER — Encounter: Payer: Self-pay | Admitting: Radiation Oncology

## 2014-10-20 NOTE — Progress Notes (Signed)
Location of Breast Cancer:  Right Breast Lower Outer Quadrant     Histology per Pathology Report: Diagnosis 09/01/14: Breast, right, needle core biopsy- INVASIVE DUCTAL CARCINOMA, SEE COMMENT.- DUCTAL CARCINOMA IN SITU.  Receptor Status: ER(+100%) PR (+ 30%), Her2-neu () neg.Ki-6730% initial dx)  Did patient present with symptoms (if so, please note symptoms) or was this found on screening mammography?: found on screening mammogram  Past/Anticipated interventions by surgeon, if any: :  Diagnosis 09/30/14: 1. Lymph node, sentinel, biopsy, Right sentinel #1- ONE BENIGN LYMPH NODE (0/1) 2. Breast, lumpectomy, Right- INVASIVE DUCTAL CARCINOMA, 1.8 CM.- DUCTAL CARCINOMA IN SITU.- CLOSEST MARGIN ANTERIOR AT 0.1 CM. 3. Breast, excision, Right inferior margin 1 of 4 Diagnosis(continued) - BENIGN BREAST TISSUE.- FINAL MARGIN CLEAR. 4. Breast, excision, Right anterior margin- BENIGN FIBROADIPOSE TISSUE.- FINAL MARGIN CLEAR. RIGHT BREAST LUMPECTOMY WITH RADIOACTIVE SEED AND SENTINEL LYMPH NODE BIOPSY (Right) SURGEON:* Autumn Messing III, MD -  ER(+100%), PR=(29%), Her-2(neg. 21% Ki-67 21%) (stage 1A(T1c NO c M0) staged on final lumpectomy  Past/Anticipated interventions by medical oncology, if any: Chemotherapy : Dr. Lindi Adie  [facility] 09/07/14, last note 10/10/14 Recommendation :Oncotype DX testing  Lymphedema issues, if any: None    Pain issues, if any:  None  SAFETY ISSUES:   Prior radiation? NO  Pacemaker/ICD? NO  Possible current pregnancy?NO  Is the patient on methotrexate? NO  Current Complaints / other details: Divorced,  Menarche age 55, G75,P2 36st live birth age 62, OCP use for approximately 5 years, post menopausal,HRT x 3 years  never smoker or smokeless tobacco use, no drug use, occasional wine  Mother=Melanoma,(leg in her 36's) deceased age 72,  Father heart disease,Sister- kidney failure deceased age 40, , Maternal Uncle heart disease   Allergies:NKA   McElroy, Felicita Gage,  RN 10/20/2014,5:56 PM  Menarche age 72 or 53, BC x 5 years, G2, P2, Menopause age in her ~ age 26, No HRT x 3 years

## 2014-10-23 DIAGNOSIS — Z923 Personal history of irradiation: Secondary | ICD-10-CM

## 2014-10-23 HISTORY — DX: Personal history of irradiation: Z92.3

## 2014-10-25 ENCOUNTER — Encounter: Payer: Self-pay | Admitting: Radiation Oncology

## 2014-10-26 ENCOUNTER — Ambulatory Visit
Admission: RE | Admit: 2014-10-26 | Discharge: 2014-10-26 | Disposition: A | Discharge status: Home / Self Care | Payer: BC Managed Care – PPO | Source: Ambulatory Visit | Attending: Radiation Oncology | Admitting: Radiation Oncology

## 2014-10-26 ENCOUNTER — Encounter: Payer: Self-pay | Admitting: Radiation Oncology

## 2014-10-26 VITALS — BP 120/73 | HR 67 | Temp 97.6°F | Ht 60.0 in | Wt 198.0 lb

## 2014-10-26 DIAGNOSIS — C50511 Malignant neoplasm of lower-outer quadrant of right female breast: Secondary | ICD-10-CM | POA: Diagnosis present

## 2014-10-26 NOTE — Progress Notes (Signed)
   Department of Radiation Oncology  Phone:  [phone] Fax:        [phone]   Name: Cynthia Barton MRN: [id number]  DOB: [birth year]  Date: 10/26/2014  Follow Up Visit Note  Diagnosis: Breast cancer of lower-outer quadrant of right female breast   Staging form: Breast, AJCC 7th Edition     Clinical stage from 09/07/2014: Stage IA (T1c, N0, M0) - Unsigned       Staging comments: Staged at breast conference on 3.16.16      Pathologic stage from 10/03/2014: Stage IA (T1c, N0, cM0) - Signed by Enid Cutter, MD on 10/06/2014       Staging comments: Staged on final lumpectomy specimen by Dr. Saralyn Pilar  Interval History: Cynthia Barton presents today for routine followup.  Cynthia Barton had a lumpectomy and a sentinel lymph node biopsy on 09/30/2014. This showed a 1.8 cm tumor with negative margins, a single lymph node was positive. The tumor was ER positive at 100%, PR positive at 21%, and HER 2 negative An oncotype has been sent and is pending. She has recovered well from her surgery. She is ready to begin radiation. 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   Physical Exam:  Filed Vitals:   10/26/14 0727  BP: 120/73  Pulse: 67  Temp: 97.6 F (36.4 C)  Height: 5' (1.524 m)  Weight: 198 lb (89.812 kg)   Healing sentinel lymph node incision and well healed incision in the inframammary fold  IMPRESSION: Cynthia Barton is a 69 y.o. female s/p lumpectomy.   PLAN:  Assuming her Oncotype is low, she is ready to proceed with radiation.  I have asked Dr. Lindi Adie to let me know those results today. I spoke to the patient today regarding her diagnosis and options for treatment. We discussed the equivalence in terms of survival and local failure between mastectomy and breast conservation. We discussed the role of radiation in decreasing local failures in patients who undergo lumpectomy. We discussed the process of simulation and the placement tattoos. We discussed 4-6 weeks of treatment as an outpatient which will be  determined at simulation. We discussed the possibility of asymptomatic lung damage. We discussed the low likelihood of secondary malignancies. We discussed the possible side effects including but not limited to skin redness, fatigue, permanent skin darkening, and breast swelling.    This document serves as a record of services personally performed by Thea Silversmith, MD. It was created on her behalf by Arlyce Harman, a trained medical scribe. The creation of this record is based on the scribe's personal observations and the provider's statements to them. This document has been checked and approved by the attending provider.      Thea Silversmith, MD

## 2014-10-26 NOTE — Addendum Note (Signed)
Encounter addended by: Benn Moulder, RN on: 10/26/2014  8:30 AM<BR>     Documentation filed: Charges VN

## 2014-10-27 ENCOUNTER — Encounter (HOSPITAL_COMMUNITY): Payer: Self-pay

## 2014-10-27 ENCOUNTER — Telehealth: Payer: Self-pay | Admitting: *Deleted

## 2014-10-27 NOTE — Telephone Encounter (Signed)
Received oncotype score of 17/11%.  Spoke with patient today about her oncotype results.  She will proceed with radiation.  Encouraged her to call with any needs or concerns.

## 2014-11-02 ENCOUNTER — Ambulatory Visit
Admission: RE | Admit: 2014-11-02 | Discharge: 2014-11-02 | Disposition: A | Discharge status: Home / Self Care | Payer: BC Managed Care – PPO | Source: Ambulatory Visit | Attending: Radiation Oncology | Admitting: Radiation Oncology

## 2014-11-02 DIAGNOSIS — C50511 Malignant neoplasm of lower-outer quadrant of right female breast: Secondary | ICD-10-CM

## 2014-11-02 NOTE — Progress Notes (Addendum)
Name: Jadwiga Faidley   MRN: [id number]  Date:  11/02/2014  DOB: [birth year]  Status:outpatient    DIAGNOSIS: Breast cancer  CONSENT VERIFIED: yes   SET UP: Patient is setup supine   IMMOBILIZATION:  The following immobilization was used:Custom Moldable Pillow, breast board.   NARRATIVE: Ms. Martorelli was brought to the [facility].  Identity was confirmed.  All relevant records and images related to the planned course of therapy were reviewed.  Then, the patient was positioned in a stable reproducible clinical set-up for radiation therapy.  Wires were placed to delineate the clinical extent of breast tissue. A wire was placed on the scar as well.  CT images were obtained.  An isocenter was placed. Skin markings were placed.  The CT images were loaded into the planning software where the target and avoidance structures were contoured.  The radiation prescription was entered and confirmed. The patient was discharged in stable condition and tolerated simulation well.    TREATMENT PLANNING NOTE:  Treatment planning then occurred. I have requested : MLC's, isodose plan, basic dose calculation  I personally designed and supervised the construction of 3 medically necessary complex treatment devices for the protection of critical normal structures including the lungs and contralateral breast as well as the immobilization device which is necessary for set up certainty.   3D simulation occurred. I requested and analyzed a dose volume histogram of the heart, lungs and lumpectomy cavity.  This document serves as a record of services personally performed by Thea Silversmith, MD. It was created on her behalf by Darcus Austin, a trained medical scribe. The creation of this record is based on the scribe's personal observations and the provider's statements to them. This document has been checked and approved by the attending provider.

## 2014-11-07 DIAGNOSIS — C50511 Malignant neoplasm of lower-outer quadrant of right female breast: Secondary | ICD-10-CM | POA: Diagnosis not present

## 2014-11-09 ENCOUNTER — Ambulatory Visit
Admission: RE | Admit: 2014-11-09 | Discharge: 2014-11-09 | Disposition: A | Discharge status: Home / Self Care | Payer: BC Managed Care – PPO | Source: Ambulatory Visit | Attending: Radiation Oncology | Admitting: Radiation Oncology

## 2014-11-09 DIAGNOSIS — C50511 Malignant neoplasm of lower-outer quadrant of right female breast: Secondary | ICD-10-CM | POA: Diagnosis not present

## 2014-11-10 ENCOUNTER — Ambulatory Visit
Admission: RE | Admit: 2014-11-10 | Discharge: 2014-11-10 | Disposition: A | Discharge status: Home / Self Care | Payer: BC Managed Care – PPO | Source: Ambulatory Visit | Attending: Radiation Oncology | Admitting: Radiation Oncology

## 2014-11-10 DIAGNOSIS — C50511 Malignant neoplasm of lower-outer quadrant of right female breast: Secondary | ICD-10-CM | POA: Diagnosis not present

## 2014-11-11 ENCOUNTER — Ambulatory Visit
Admission: RE | Admit: 2014-11-11 | Discharge: 2014-11-11 | Disposition: A | Discharge status: Home / Self Care | Payer: BC Managed Care – PPO | Source: Ambulatory Visit | Attending: Radiation Oncology | Admitting: Radiation Oncology

## 2014-11-11 DIAGNOSIS — C50511 Malignant neoplasm of lower-outer quadrant of right female breast: Secondary | ICD-10-CM | POA: Diagnosis not present

## 2014-11-12 ENCOUNTER — Other Ambulatory Visit: Payer: Self-pay

## 2014-11-14 ENCOUNTER — Telehealth: Payer: Self-pay | Admitting: Hematology and Oncology

## 2014-11-14 ENCOUNTER — Ambulatory Visit
Admission: RE | Admit: 2014-11-14 | Discharge: 2014-11-14 | Disposition: A | Discharge status: Home / Self Care | Payer: BC Managed Care – PPO | Source: Ambulatory Visit | Attending: Radiation Oncology | Admitting: Radiation Oncology

## 2014-11-14 DIAGNOSIS — C50511 Malignant neoplasm of lower-outer quadrant of right female breast: Secondary | ICD-10-CM | POA: Diagnosis not present

## 2014-11-14 NOTE — Telephone Encounter (Signed)
s.w. pt dtr and advised on 6.27appt....pt ok and aware

## 2014-11-15 ENCOUNTER — Ambulatory Visit
Admission: RE | Admit: 2014-11-15 | Discharge: 2014-11-15 | Disposition: A | Discharge status: Home / Self Care | Payer: BC Managed Care – PPO | Source: Ambulatory Visit | Attending: Radiation Oncology | Admitting: Radiation Oncology

## 2014-11-15 VITALS — BP 115/58 | HR 75 | Temp 97.8°F | Wt 199.6 lb

## 2014-11-15 DIAGNOSIS — C50511 Malignant neoplasm of lower-outer quadrant of right female breast: Secondary | ICD-10-CM

## 2014-11-15 DIAGNOSIS — M79602 Pain in left arm: Secondary | ICD-10-CM | POA: Diagnosis not present

## 2014-11-15 MED ORDER — ALRA NON-METALLIC DEODORANT (RAD-ONC)
1.0000 "application " | Freq: Once | TOPICAL | Status: AC
  Administered 2014-11-15: 1 via TOPICAL

## 2014-11-15 MED ORDER — RADIAPLEXRX EX GEL
Freq: Once | CUTANEOUS | Status: AC
  Administered 2014-11-15: 17:00:00 via TOPICAL

## 2014-11-15 NOTE — Progress Notes (Signed)
Pt here for patient teaching.  Pt given Radiation and You booklet, skin care instructions, Alra deodorant and Radiaplex gel. Reviewed areas of pertinence such as  . Pt able to give teach back of ,apply Radiaplex bid, avoid applying anything to skin within 4 hours of treatment, avoid wearing an under wire bra and to use an electric razor if they must shave. Pt demonstrated understanding, needs reinforcement and verbalizes understanding of information given and will contact nursing with any questions or concerns.

## 2014-11-15 NOTE — Progress Notes (Signed)
Weekly Management Note Current Dose: 10.68  Gy  Projected Dose: 52.72 Gy   Narrative:  The patient presents for routine under treatment assessment.  CBCT/MVCT images/Port film x-rays were reviewed.  The chart was checked. Doing well. Some pain in left arm.   Physical Findings: Weight: 199 lb 9.6 oz (90.538 kg). Unchanged  Impression:  The patient is tolerating radiation.  Plan:  Continue treatment as planned.

## 2014-11-16 ENCOUNTER — Ambulatory Visit
Admission: RE | Admit: 2014-11-16 | Discharge: 2014-11-16 | Disposition: A | Discharge status: Home / Self Care | Payer: BC Managed Care – PPO | Source: Ambulatory Visit | Attending: Radiation Oncology | Admitting: Radiation Oncology

## 2014-11-16 DIAGNOSIS — C50511 Malignant neoplasm of lower-outer quadrant of right female breast: Secondary | ICD-10-CM | POA: Diagnosis not present

## 2014-11-17 ENCOUNTER — Ambulatory Visit
Admission: RE | Admit: 2014-11-17 | Discharge: 2014-11-17 | Disposition: A | Discharge status: Home / Self Care | Payer: BC Managed Care – PPO | Source: Ambulatory Visit | Attending: Radiation Oncology | Admitting: Radiation Oncology

## 2014-11-17 DIAGNOSIS — C50511 Malignant neoplasm of lower-outer quadrant of right female breast: Secondary | ICD-10-CM | POA: Diagnosis not present

## 2014-11-18 ENCOUNTER — Ambulatory Visit
Admission: RE | Admit: 2014-11-18 | Discharge: 2014-11-18 | Disposition: A | Discharge status: Home / Self Care | Payer: BC Managed Care – PPO | Source: Ambulatory Visit | Attending: Radiation Oncology | Admitting: Radiation Oncology

## 2014-11-18 DIAGNOSIS — C50511 Malignant neoplasm of lower-outer quadrant of right female breast: Secondary | ICD-10-CM | POA: Diagnosis not present

## 2014-11-22 ENCOUNTER — Ambulatory Visit
Admission: RE | Admit: 2014-11-22 | Discharge: 2014-11-22 | Disposition: A | Discharge status: Home / Self Care | Payer: BC Managed Care – PPO | Source: Ambulatory Visit | Attending: Radiation Oncology | Admitting: Radiation Oncology

## 2014-11-22 VITALS — BP 128/66 | HR 66 | Temp 97.5°F | Wt 197.7 lb

## 2014-11-22 DIAGNOSIS — C50511 Malignant neoplasm of lower-outer quadrant of right female breast: Secondary | ICD-10-CM

## 2014-11-22 NOTE — Progress Notes (Signed)
Weekly Management Note Current Dose: 21.36  Gy  Projected Dose: 52.72  Narrative:  The patient presents for routine under treatment assessment.  CBCT/MVCT images/Port film x-rays were reviewed.  The chart was checked. Doing well. No complaints.   Physical Findings: Weight: 197 lb 11.2 oz (89.676 kg). Unchanged  Impression:  The patient is tolerating radiation.  Plan:  Continue treatment as planned.

## 2014-11-23 ENCOUNTER — Ambulatory Visit
Admission: RE | Admit: 2014-11-23 | Discharge: 2014-11-23 | Disposition: A | Discharge status: Home / Self Care | Payer: BC Managed Care – PPO | Source: Ambulatory Visit | Attending: Radiation Oncology | Admitting: Radiation Oncology

## 2014-11-23 ENCOUNTER — Encounter: Payer: Self-pay | Admitting: Radiation Oncology

## 2014-11-23 DIAGNOSIS — C50511 Malignant neoplasm of lower-outer quadrant of right female breast: Secondary | ICD-10-CM | POA: Diagnosis not present

## 2014-11-23 NOTE — Progress Notes (Signed)
Received FMLA paperwork, forwarded to RN for processing

## 2014-11-24 ENCOUNTER — Ambulatory Visit
Admission: RE | Admit: 2014-11-24 | Discharge: 2014-11-24 | Disposition: A | Discharge status: Home / Self Care | Payer: BC Managed Care – PPO | Source: Ambulatory Visit | Attending: Radiation Oncology | Admitting: Radiation Oncology

## 2014-11-24 DIAGNOSIS — C50511 Malignant neoplasm of lower-outer quadrant of right female breast: Secondary | ICD-10-CM | POA: Diagnosis not present

## 2014-11-25 ENCOUNTER — Ambulatory Visit
Admission: RE | Admit: 2014-11-25 | Discharge: 2014-11-25 | Disposition: A | Discharge status: Home / Self Care | Payer: BC Managed Care – PPO | Source: Ambulatory Visit | Attending: Radiation Oncology | Admitting: Radiation Oncology

## 2014-11-25 DIAGNOSIS — C50511 Malignant neoplasm of lower-outer quadrant of right female breast: Secondary | ICD-10-CM | POA: Diagnosis not present

## 2014-11-28 ENCOUNTER — Ambulatory Visit
Admission: RE | Admit: 2014-11-28 | Discharge: 2014-11-28 | Disposition: A | Discharge status: Home / Self Care | Payer: BC Managed Care – PPO | Source: Ambulatory Visit | Attending: Radiation Oncology | Admitting: Radiation Oncology

## 2014-11-28 DIAGNOSIS — C50511 Malignant neoplasm of lower-outer quadrant of right female breast: Secondary | ICD-10-CM | POA: Diagnosis not present

## 2014-11-29 ENCOUNTER — Ambulatory Visit
Admission: RE | Admit: 2014-11-29 | Discharge: 2014-11-29 | Disposition: A | Discharge status: Home / Self Care | Payer: BC Managed Care – PPO | Source: Ambulatory Visit | Attending: Radiation Oncology | Admitting: Radiation Oncology

## 2014-11-29 VITALS — BP 132/78 | HR 71 | Temp 98.0°F | Wt 203.1 lb

## 2014-11-29 DIAGNOSIS — C50511 Malignant neoplasm of lower-outer quadrant of right female breast: Secondary | ICD-10-CM | POA: Diagnosis not present

## 2014-11-29 NOTE — Progress Notes (Signed)
Completed 13 of 31 treatments.Skin with mild hyperpigmentation.Only normal shooting pain.

## 2014-11-29 NOTE — Progress Notes (Addendum)
Weekly Management Note Current Dose: 34.71  Gy  Projected Dose: 52.72 Gy   Narrative:  The patient presents for routine under treatment assessment.  CBCT/MVCT images/Port film x-rays were reviewed.  The chart was checked. Doing well. No complaints. REquested time off work for treatment/recovery.   Physical Findings: Weight: 203 lb 1.6 oz (92.126 kg). Slightly dark breast.   Impression:  The patient is tolerating radiation.  Plan:  Continue treatment as planned. Will puit out until mid July. Discussed sun protection in the treated area.

## 2014-11-30 ENCOUNTER — Ambulatory Visit
Admission: RE | Admit: 2014-11-30 | Discharge: 2014-11-30 | Disposition: A | Discharge status: Home / Self Care | Payer: BC Managed Care – PPO | Source: Ambulatory Visit | Attending: Radiation Oncology | Admitting: Radiation Oncology

## 2014-11-30 ENCOUNTER — Encounter: Payer: Self-pay | Admitting: Radiation Oncology

## 2014-11-30 DIAGNOSIS — C50511 Malignant neoplasm of lower-outer quadrant of right female breast: Secondary | ICD-10-CM | POA: Diagnosis not present

## 2014-11-30 NOTE — Progress Notes (Signed)
Received completed FMLA paperwork back, faxed form to Eliseo Gum at [phone]

## 2014-12-01 ENCOUNTER — Ambulatory Visit
Admission: RE | Admit: 2014-12-01 | Discharge: 2014-12-01 | Disposition: A | Discharge status: Home / Self Care | Payer: BC Managed Care – PPO | Source: Ambulatory Visit | Attending: Radiation Oncology | Admitting: Radiation Oncology

## 2014-12-01 DIAGNOSIS — C50511 Malignant neoplasm of lower-outer quadrant of right female breast: Secondary | ICD-10-CM | POA: Diagnosis not present

## 2014-12-02 ENCOUNTER — Ambulatory Visit
Admission: RE | Admit: 2014-12-02 | Discharge: 2014-12-02 | Disposition: A | Discharge status: Home / Self Care | Payer: BC Managed Care – PPO | Source: Ambulatory Visit | Attending: Radiation Oncology | Admitting: Radiation Oncology

## 2014-12-02 DIAGNOSIS — C50511 Malignant neoplasm of lower-outer quadrant of right female breast: Secondary | ICD-10-CM | POA: Diagnosis not present

## 2014-12-04 ENCOUNTER — Ambulatory Visit: Payer: BC Managed Care – PPO

## 2014-12-05 ENCOUNTER — Ambulatory Visit
Admission: RE | Admit: 2014-12-05 | Discharge: 2014-12-05 | Disposition: A | Discharge status: Home / Self Care | Payer: BC Managed Care – PPO | Source: Ambulatory Visit | Attending: Radiation Oncology | Admitting: Radiation Oncology

## 2014-12-05 ENCOUNTER — Ambulatory Visit: Payer: BC Managed Care – PPO

## 2014-12-05 DIAGNOSIS — C50511 Malignant neoplasm of lower-outer quadrant of right female breast: Secondary | ICD-10-CM | POA: Diagnosis not present

## 2014-12-06 ENCOUNTER — Ambulatory Visit
Admission: RE | Admit: 2014-12-06 | Discharge: 2014-12-06 | Disposition: A | Discharge status: Home / Self Care | Payer: BC Managed Care – PPO | Source: Ambulatory Visit | Attending: Radiation Oncology | Admitting: Radiation Oncology

## 2014-12-06 ENCOUNTER — Ambulatory Visit: Payer: BC Managed Care – PPO

## 2014-12-06 VITALS — BP 132/85 | HR 74 | Temp 97.6°F | Wt 200.6 lb

## 2014-12-06 DIAGNOSIS — C50511 Malignant neoplasm of lower-outer quadrant of right female breast: Secondary | ICD-10-CM | POA: Diagnosis present

## 2014-12-06 MED ORDER — RADIAPLEXRX EX GEL
Freq: Once | CUTANEOUS | Status: AC
  Administered 2014-12-06: 15:00:00 via TOPICAL

## 2014-12-06 NOTE — Progress Notes (Signed)
FMLA papers corrected to be intermittent and faxed to [employer] to attention of Eliseo Gum [phone].

## 2014-12-06 NOTE — Progress Notes (Signed)
Weekly Management Note Current Dose: 46.72  Gy  Projected Dose: 52.72 Gy   Narrative:  The patient presents for routine under treatment assessment.  CBCT/MVCT images/Port film x-rays were reviewed.  The chart was checked. Requests disability paperwork. Skin is irritated.   Physical Findings: Weight: 200 lb 9.6 oz (90.992 kg). Dark skin over breast. Inframammary fold intact.   Impression:  The patient is tolerating radiation.  Plan:  Continue treatment as planned. Continue radiaplex.

## 2014-12-07 ENCOUNTER — Encounter: Payer: Self-pay | Admitting: Radiation Oncology

## 2014-12-07 ENCOUNTER — Ambulatory Visit
Admission: RE | Admit: 2014-12-07 | Discharge: 2014-12-07 | Disposition: A | Discharge status: Home / Self Care | Payer: BC Managed Care – PPO | Source: Ambulatory Visit | Attending: Radiation Oncology | Admitting: Radiation Oncology

## 2014-12-07 ENCOUNTER — Ambulatory Visit: Payer: BC Managed Care – PPO

## 2014-12-07 DIAGNOSIS — C50511 Malignant neoplasm of lower-outer quadrant of right female breast: Secondary | ICD-10-CM | POA: Diagnosis not present

## 2014-12-07 NOTE — Progress Notes (Signed)
FMLA paperwork has all been faxed and originals given to patient at treatment today. Copies to be scanned.

## 2014-12-08 ENCOUNTER — Ambulatory Visit: Payer: BC Managed Care – PPO

## 2014-12-08 ENCOUNTER — Ambulatory Visit
Admission: RE | Admit: 2014-12-08 | Discharge: 2014-12-08 | Disposition: A | Discharge status: Home / Self Care | Payer: BC Managed Care – PPO | Source: Ambulatory Visit | Attending: Radiation Oncology | Admitting: Radiation Oncology

## 2014-12-08 DIAGNOSIS — C50511 Malignant neoplasm of lower-outer quadrant of right female breast: Secondary | ICD-10-CM | POA: Diagnosis not present

## 2014-12-09 ENCOUNTER — Ambulatory Visit: Payer: BC Managed Care – PPO

## 2014-12-09 ENCOUNTER — Encounter: Payer: Self-pay | Admitting: Radiation Oncology

## 2014-12-09 ENCOUNTER — Ambulatory Visit
Admission: RE | Admit: 2014-12-09 | Discharge: 2014-12-09 | Disposition: A | Discharge status: Home / Self Care | Payer: BC Managed Care – PPO | Source: Ambulatory Visit | Attending: Radiation Oncology | Admitting: Radiation Oncology

## 2014-12-09 DIAGNOSIS — C50511 Malignant neoplasm of lower-outer quadrant of right female breast: Secondary | ICD-10-CM | POA: Diagnosis not present

## 2014-12-12 ENCOUNTER — Ambulatory Visit: Payer: BC Managed Care – PPO

## 2014-12-13 ENCOUNTER — Ambulatory Visit: Payer: BC Managed Care – PPO

## 2014-12-14 ENCOUNTER — Ambulatory Visit: Payer: BC Managed Care – PPO

## 2014-12-15 ENCOUNTER — Ambulatory Visit: Payer: BC Managed Care – PPO

## 2014-12-16 ENCOUNTER — Ambulatory Visit: Payer: BC Managed Care – PPO

## 2014-12-18 NOTE — Assessment & Plan Note (Signed)
Right lumpectomy: 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 29%, HER-2 negative ratio 1, Ki-67 21%, T1 cN0 M0 stage IA, Oncotype Dx 17 (11%)  Recommendation: 1. Adjuvant radiation therapy (currently ongoing) followed by 2. Antiestrogen therapy once daily for 5 years  Aromatase Inhibitor counseling: We discussed the risks and benefits of anti-estrogen therapy with aromatase inhibitors. These include but not limited to insomnia, hot flashes, mood changes, vaginal dryness, bone density loss, and weight gain. Although rare, serious side effects including endometrial cancer, risk of blood clots were also discussed. We strongly believe that the benefits far outweigh the risks. Patient understands these risks and consented to starting treatment. Planned treatment duration is 5 years.  RTC 3 months   

## 2014-12-19 ENCOUNTER — Ambulatory Visit (HOSPITAL_BASED_OUTPATIENT_CLINIC_OR_DEPARTMENT_OTHER): Payer: BC Managed Care – PPO | Admitting: Hematology and Oncology

## 2014-12-19 ENCOUNTER — Encounter: Payer: Self-pay | Admitting: Hematology and Oncology

## 2014-12-19 ENCOUNTER — Ambulatory Visit: Payer: BC Managed Care – PPO

## 2014-12-19 VITALS — BP 146/82 | HR 69 | Temp 98.0°F | Resp 19 | Ht 60.0 in | Wt 199.4 lb

## 2014-12-19 DIAGNOSIS — Z79811 Long term (current) use of aromatase inhibitors: Secondary | ICD-10-CM | POA: Diagnosis not present

## 2014-12-19 DIAGNOSIS — C50511 Malignant neoplasm of lower-outer quadrant of right female breast: Secondary | ICD-10-CM | POA: Diagnosis not present

## 2014-12-19 DIAGNOSIS — Z170 Estrogen receptor positive status [ER+]: Secondary | ICD-10-CM | POA: Diagnosis not present

## 2014-12-19 MED ORDER — ANASTROZOLE 1 MG PO TABS: 1.0000 mg | ORAL_TABLET | Freq: Every day | ORAL | Status: DC

## 2014-12-19 NOTE — Progress Notes (Signed)
Patient Care Team: Glendale Chard, MD as PCP - General (Internal Medicine) Autumn Messing III, MD as Consulting Physician (General Surgery) Nicholas Lose, MD as Consulting Physician (Hematology and Oncology) Thea Silversmith, MD as Consulting Physician (Radiation Oncology) Rockwell Germany, RN as Registered Nurse Mauro Kaufmann, RN as Registered Nurse Holley Bouche, NP as Nurse Practitioner (Nurse Practitioner)  DIAGNOSIS: Breast cancer of lower-outer quadrant of right female breast   Staging form: Breast, AJCC 7th Edition     Clinical stage from 09/07/2014: Stage IA (T1c, N0, M0) - Unsigned       Staging comments: Staged at breast conference on 3.16.16      Pathologic stage from 10/03/2014: Stage IA (T1c, N0, cM0) - Signed by Enid Cutter, MD on 10/06/2014       Staging comments: Staged on final lumpectomy specimen by Dr. Saralyn Pilar    SUMMARY OF ONCOLOGIC HISTORY:   Breast cancer of lower-outer quadrant of right female breast   09/01/2014 Initial Diagnosis Right breast invasive ductal carcinoma with DCIS, grade 2, ER 100%, P of 30%, Ki-67 30%, HER-2 negative ratio 1   09/30/2014 Surgery Right lumpectomy: 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 29%, HER-2 negative ratio 1, Ki-67 21%, T1 cN0 M0 stage IA, Oncotype Dx 17 (11%)   11/15/2014 - 12/09/2014 Radiation Therapy Adj XRT    CHIEF COMPLIANT: Follow-up after radiation treatment  INTERVAL HISTORY: Cynthia Barton is a 69 year old with above-mentioned history of right breast cancer treated with lumpectomy and radiation and is here today to Dr. by the next step in the treatment plan. She is recovering from radiation terms of skin changes. Adjuvant levels are slowly getting better.  REVIEW OF SYSTEMS:   Constitutional: Denies fevers, chills or abnormal weight loss Eyes: Denies blurriness of vision Ears, nose, mouth, throat, and face: Denies mucositis or sore throat Respiratory: Denies cough, dyspnea or wheezes Cardiovascular: Denies  palpitation, chest discomfort or lower extremity swelling Gastrointestinal:  Denies nausea, heartburn or change in bowel habits Skin: Denies abnormal skin rashes Lymphatics: Denies new lymphadenopathy or easy bruising Neurological:Denies numbness, tingling or new weaknesses Behavioral/Psych: Mood is stable, no new changes  Breast: Skin discoloration from the effects of radiation therapy on the right breast All other systems were reviewed with the patient and are negative.  I have reviewed the past medical history, past surgical history, social history and family history with the patient and they are unchanged from previous note.  ALLERGIES:  has No Known Allergies.  MEDICATIONS:  Current Outpatient Prescriptions  Medication Sig Dispense Refill  . lisinopril (PRINIVIL,ZESTRIL) 10 MG tablet Take 10 mg by mouth daily.    . Pitavastatin Calcium 4 MG TABS Take by mouth daily.    Marland Kitchen anastrozole (ARIMIDEX) 1 MG tablet Take 1 tablet (1 mg total) by mouth daily. 90 tablet 3   No current facility-administered medications for this visit.    PHYSICAL EXAMINATION: ECOG PERFORMANCE STATUS: 1 - Symptomatic but completely ambulatory  Filed Vitals:   12/19/14 1424  BP: 146/82  Pulse: 69  Temp: 98 F (36.7 C)  Resp: 19   Filed Weights   12/19/14 1424  Weight: 199 lb 6.4 oz (90.447 kg)    GENERAL:alert, no distress and comfortable SKIN: skin color, texture, turgor are normal, no rashes or significant lesions EYES: normal, Conjunctiva are pink and non-injected, sclera clear OROPHARYNX:no exudate, no erythema and lips, buccal mucosa, and tongue normal  NECK: supple, thyroid normal size, non-tender, without nodularity LYMPH:  no palpable lymphadenopathy  in the cervical, axillary or inguinal LUNGS: clear to auscultation and percussion with normal breathing effort HEART: regular rate & rhythm and no murmurs and no lower extremity edema ABDOMEN:abdomen soft, non-tender and normal bowel  sounds Musculoskeletal:no cyanosis of digits and no clubbing  NEURO: alert & oriented x 3 with fluent speech, no focal motor/sensory deficits BREAST: Right breast darkening but there is no skin breakdown. (exam performed in the presence of a chaperone)  LABORATORY DATA:  I have reviewed the data as listed   Chemistry      Component Value Date/Time   NA 142 09/07/2014 1237   K 4.3 09/07/2014 1237   CO2 27 09/07/2014 1237   BUN 11.3 09/07/2014 1237   CREATININE 0.9 09/07/2014 1237      Component Value Date/Time   CALCIUM 10.6* 09/07/2014 1237   ALKPHOS 51 09/07/2014 1237   AST 27 09/07/2014 1237   ALT 22 09/07/2014 1237   BILITOT 0.44 09/07/2014 1237       Lab Results  Component Value Date   WBC 7.0 09/07/2014   HGB 14.3 09/30/2014   HCT 41.4 09/07/2014   MCV 87.0 09/07/2014   PLT 176 09/07/2014   NEUTROABS 4.2 09/07/2014   She ears ASSESSMENT & PLAN:  Breast cancer of lower-outer quadrant of right female breast Right lumpectomy: 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 29%, HER-2 negative ratio 1, Ki-67 21%, T1 cN0 M0 stage IA, Oncotype Dx 17 (11%)  Recommendation: 1. Adjuvant radiation therapy has been completed 2. Antiestrogen therapy anastrozole once daily for 5 years  Aromatase Inhibitor counseling: We discussed the risks and benefits of anti-estrogen therapy with aromatase inhibitors. These include but not limited to insomnia, hot flashes, mood changes, vaginal dryness, bone density loss, and weight gain. Although rare, serious side effects including endometrial cancer, risk of blood clots were also discussed. We strongly believe that the benefits far outweigh the risks. Patient understands these risks and consented to starting treatment. Planned treatment duration is 5 years.  RTC 3 months  Orders Placed This Encounter  Procedures  . DG Bone Density    Standing Status: Future     Number of Occurrences:      Standing Expiration Date: 12/19/2015     Order Specific Question:  Reason for Exam (SYMPTOM  OR DIAGNOSIS REQUIRED)    Answer:  Post menopausal starting anti-estrogen therapy    Order Specific Question:  Preferred imaging location?    Answer:  [facility]   The patient has a good understanding of the overall plan. she agrees with it. she will call with any problems that may develop before the next visit here.   Rulon Eisenmenger, MD

## 2014-12-19 NOTE — Progress Notes (Addendum)
  Radiation Oncology         [phone] ________________________________  Name: Cynthia Barton MRN: [id number]  Date: 12/09/2014  DOB: 03/16/1946  End of Treatment Note  Diagnosis:  Breast cancer of lower-outer quadrant of right female breast   Staging form: Breast, AJCC 7th Edition     Clinical stage from 09/07/2014: Stage IA (T1c, N0, M0) - Unsigned       Staging comments: Staged at breast conference on 3.16.16      Pathologic stage from 10/03/2014: Stage IA (T1c, N0, cM0) - Signed by Enid Cutter, MD on 10/06/2014       Staging comments: Staged on final lumpectomy specimen by Dr. Saralyn Pilar  Indication for treatment:  Curative    Radiation treatment dates:   11/10/2014-12/09/2014  Site/dose:   Right breast/ 42.72 Gy at 2.67 Gy per fraction x 21 fractions.  Right breast boost/ 10 Gy at 2 Gy per fraction x 5 fractions  Beams/energy:  Opposed tangents with reduced fields / 6 and 10 MV photons Three field photon boost with 10 MV photons.   Narrative: The patient tolerated radiation treatment relatively well.  She had some minimal hyperpigmentation which was treated with radiaplex. She also had minimal fatigue.   Plan: The patient has completed radiation treatment. The patient will return to radiation oncology clinic for routine followup in one month. I advised them to call or return sooner if they have any questions or concerns related to their recovery or treatment.  ------------------------------------------------  Thea Silversmith, MD

## 2014-12-20 ENCOUNTER — Ambulatory Visit: Payer: BC Managed Care – PPO

## 2014-12-21 ENCOUNTER — Ambulatory Visit: Payer: BC Managed Care – PPO

## 2014-12-22 ENCOUNTER — Other Ambulatory Visit: Payer: Self-pay | Admitting: Nurse Practitioner

## 2014-12-22 ENCOUNTER — Ambulatory Visit (HOSPITAL_BASED_OUTPATIENT_CLINIC_OR_DEPARTMENT_OTHER): Payer: BC Managed Care – PPO | Admitting: Nurse Practitioner

## 2014-12-22 ENCOUNTER — Ambulatory Visit (HOSPITAL_BASED_OUTPATIENT_CLINIC_OR_DEPARTMENT_OTHER): Payer: BC Managed Care – PPO

## 2014-12-22 ENCOUNTER — Telehealth: Payer: Self-pay | Admitting: Nurse Practitioner

## 2014-12-22 ENCOUNTER — Ambulatory Visit (HOSPITAL_COMMUNITY)
Admission: RE | Admit: 2014-12-22 | Discharge: 2014-12-22 | Disposition: A | Discharge status: Home / Self Care | Payer: BC Managed Care – PPO | Source: Ambulatory Visit | Attending: Nurse Practitioner | Admitting: Nurse Practitioner

## 2014-12-22 ENCOUNTER — Telehealth: Payer: Self-pay | Admitting: *Deleted

## 2014-12-22 ENCOUNTER — Other Ambulatory Visit: Payer: Self-pay | Admitting: *Deleted

## 2014-12-22 ENCOUNTER — Ambulatory Visit: Payer: BC Managed Care – PPO

## 2014-12-22 VITALS — BP 129/83 | HR 67 | Temp 98.0°F | Resp 18 | Wt 198.7 lb

## 2014-12-22 DIAGNOSIS — Z853 Personal history of malignant neoplasm of breast: Secondary | ICD-10-CM | POA: Insufficient documentation

## 2014-12-22 DIAGNOSIS — C50511 Malignant neoplasm of lower-outer quadrant of right female breast: Secondary | ICD-10-CM

## 2014-12-22 DIAGNOSIS — M545 Low back pain: Secondary | ICD-10-CM | POA: Insufficient documentation

## 2014-12-22 DIAGNOSIS — M4316 Spondylolisthesis, lumbar region: Secondary | ICD-10-CM | POA: Insufficient documentation

## 2014-12-22 DIAGNOSIS — M25552 Pain in left hip: Secondary | ICD-10-CM

## 2014-12-22 DIAGNOSIS — M8538 Osteitis condensans, other site: Secondary | ICD-10-CM | POA: Insufficient documentation

## 2014-12-22 DIAGNOSIS — M419 Scoliosis, unspecified: Secondary | ICD-10-CM | POA: Diagnosis not present

## 2014-12-22 LAB — CBC WITH DIFFERENTIAL/PLATELET
BASO%: 0.3 % (ref 0.0–2.0)
BASOS ABS: 0 10*3/uL (ref 0.0–0.1)
EOS%: 0.5 % (ref 0.0–7.0)
Eosinophils Absolute: 0 10*3/uL (ref 0.0–0.5)
HCT: 41.1 % (ref 34.8–46.6)
HGB: 13.2 g/dL (ref 11.6–15.9)
LYMPH%: 11.2 % — AB (ref 14.0–49.7)
MCH: 27.7 pg (ref 25.1–34.0)
MCHC: 32.1 g/dL (ref 31.5–36.0)
MCV: 86.5 fL (ref 79.5–101.0)
MONO#: 0.7 10*3/uL (ref 0.1–0.9)
MONO%: 8.8 % (ref 0.0–14.0)
NEUT#: 6.2 10*3/uL (ref 1.5–6.5)
NEUT%: 79.2 % — ABNORMAL HIGH (ref 38.4–76.8)
Platelets: 158 10*3/uL (ref 145–400)
RBC: 4.76 10*6/uL (ref 3.70–5.45)
RDW: 13.9 % (ref 11.2–14.5)
WBC: 7.8 10*3/uL (ref 3.9–10.3)
lymph#: 0.9 10*3/uL (ref 0.9–3.3)

## 2014-12-22 LAB — COMPREHENSIVE METABOLIC PANEL (CC13)
ALT: 21 U/L (ref 0–55)
AST: 25 U/L (ref 5–34)
Albumin: 3.4 g/dL — ABNORMAL LOW (ref 3.5–5.0)
Alkaline Phosphatase: 56 U/L (ref 40–150)
Anion Gap: 6 mEq/L (ref 3–11)
BUN: 9.6 mg/dL (ref 7.0–26.0)
CO2: 25 meq/L (ref 22–29)
CREATININE: 0.8 mg/dL (ref 0.6–1.1)
Calcium: 10.1 mg/dL (ref 8.4–10.4)
Chloride: 108 mEq/L (ref 98–109)
EGFR: 87 mL/min/{1.73_m2} — AB (ref >90)
Glucose: 103 mg/dl (ref 70–140)
Potassium: 3.9 mEq/L (ref 3.5–5.1)
Sodium: 140 mEq/L (ref 136–145)
Total Bilirubin: 0.75 mg/dL (ref 0.20–1.20)
Total Protein: 6.8 g/dL (ref 6.4–8.3)

## 2014-12-22 NOTE — Telephone Encounter (Signed)
Received call from daughter Landry Dyke stating that pt has severe hip pain.  Called pt and was informed that pt had severe Left hip pain this am - pt could not walk to bathroom.  Pt had some leftover pain med from surgery  Hydrocodone/APAP  5/325mg .  Pt took one pain pill with some relief.  Pt could stand up with cane but not much weight bearing on Left side, and difficult to walk per pt. Instructed daughter to bring pt in today at 130pm for lab and to see Jenny Reichmann, NP symptom management at [facility] voiced understanding. Pt's  Phone   [phone].

## 2014-12-22 NOTE — Telephone Encounter (Signed)
Contrast given to patient and she was advised not to drink/eat in the morning incase they call.  avs pritned

## 2014-12-23 ENCOUNTER — Ambulatory Visit: Payer: BC Managed Care – PPO

## 2014-12-26 NOTE — Addendum Note (Signed)
Encounter addended by: Thea Silversmith, MD on: 12/26/2014  2:37 PM<BR>     Documentation filed: Notes Section

## 2014-12-26 NOTE — Progress Notes (Signed)
  Radiation Oncology         [phone]) [phone] ________________________________  Name: Cynthia Barton MRN: [id number]  Date: 12/05/14  DOB: 29-Apr-1946  Simulation Verification Note  Status: outpatient  NARRATIVE: The patient was brought to the treatment unit and placed in the planned treatment position. The clinical setup was verified. Then port films were obtained and uploaded to the radiation oncology medical record software.  The treatment beams were carefully compared against the planned radiation fields. The position location and shape of the radiation fields was reviewed. The targeted volume of tissue appears appropriately covered by the radiation beams. Organs at risk appear to be excluded as planned.  Based on my personal review, I approved the simulation verification. The patient's treatment will proceed as planned.  ------------------------------------------------  Thea Silversmith, MD

## 2014-12-26 NOTE — Progress Notes (Signed)
Name: Cynthia Barton   MRN: [id number]  Date:  11/29/14 Status:outpatient    DIAGNOSIS: Breast cancer of lower-outer quadrant of right female breast   Staging form: Breast, AJCC 7th Edition     Clinical stage from 09/07/2014: Stage IA (T1c, N0, M0) - Unsigned       Staging comments: Staged at breast conference on 3.16.16      Pathologic stage from 10/03/2014: Stage IA (T1c, N0, cM0) - Signed by Enid Cutter, MD on 10/06/2014       Staging comments: Staged on final lumpectomy specimen by Dr. Lewayne Bunting VERIFIED: yes   SET UP: Patient is setup supine   IMMOBILIZATION:  The following immobilization was used:Custom Moldable Pillow, breast board.   NARRATIVE: Cynthia Barton underwent complex simulation and treatment planning for her boost treatment today.  Her tumor volume was outlined on the planning CT scan.  Due to the depth of her cavity, electrons could not be used and a photon plan was developed. The plan will be prescribed to the  isodose line.   I personally supervised and approved the creation of 3 unique MLCs comprising 3  treatment devices.

## 2014-12-27 ENCOUNTER — Other Ambulatory Visit: Payer: Self-pay | Admitting: Nurse Practitioner

## 2014-12-27 ENCOUNTER — Ambulatory Visit: Payer: BC Managed Care – PPO

## 2014-12-27 ENCOUNTER — Ambulatory Visit (HOSPITAL_COMMUNITY)
Admission: RE | Admit: 2014-12-27 | Discharge: 2014-12-27 | Disposition: A | Discharge status: Home / Self Care | Payer: BC Managed Care – PPO | Source: Ambulatory Visit | Attending: Nurse Practitioner | Admitting: Nurse Practitioner

## 2014-12-27 DIAGNOSIS — Z853 Personal history of malignant neoplasm of breast: Secondary | ICD-10-CM | POA: Insufficient documentation

## 2014-12-27 DIAGNOSIS — C50511 Malignant neoplasm of lower-outer quadrant of right female breast: Secondary | ICD-10-CM

## 2014-12-27 DIAGNOSIS — Z923 Personal history of irradiation: Secondary | ICD-10-CM | POA: Diagnosis not present

## 2014-12-27 DIAGNOSIS — K5730 Diverticulosis of large intestine without perforation or abscess without bleeding: Secondary | ICD-10-CM | POA: Diagnosis not present

## 2014-12-27 DIAGNOSIS — M549 Dorsalgia, unspecified: Secondary | ICD-10-CM | POA: Diagnosis present

## 2014-12-27 MED ORDER — IOHEXOL 300 MG/ML  SOLN
100.0000 mL | Freq: Once | INTRAMUSCULAR | Status: AC | PRN
  Administered 2014-12-27: 100 mL via INTRAVENOUS

## 2014-12-28 ENCOUNTER — Ambulatory Visit: Payer: BC Managed Care – PPO

## 2014-12-30 ENCOUNTER — Encounter: Payer: Self-pay | Admitting: Nurse Practitioner

## 2014-12-30 ENCOUNTER — Telehealth: Payer: Self-pay | Admitting: *Deleted

## 2014-12-30 DIAGNOSIS — M25559 Pain in unspecified hip: Secondary | ICD-10-CM | POA: Insufficient documentation

## 2014-12-30 NOTE — Progress Notes (Signed)
SYMPTOM MANAGEMENT CLINIC   HPI: Cynthia Barton 69 y.o. female diagnosed with breast cancer.  Patient is status post lumpectomy and radiation therapy.  Currently undergoing anastrozole therapy.    Breast cancer of lower-outer quadrant of right female breast   09/01/2014 Initial Diagnosis Right breast invasive ductal carcinoma with DCIS, grade 2, ER 100%, P of 30%, Ki-67 30%, HER-2 negative ratio 1   09/30/2014 Surgery Right lumpectomy: 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 29%, HER-2 negative ratio 1, Ki-67 21%, T1 cN0 M0 stage IA, Oncotype Dx 17 (11%)   11/15/2014 - 12/09/2014 Radiation Therapy Adj XRT       Patient complaining of new onset acute left hip pain.  When she awoke this morning.  She states she is unable to walk on her left leg due to the end tends pain that radiates from her left low back and hip region down the back of her buttock and her leg.  She denies any new injury or trauma.  She is also complaining of some occasional/intermittent left arm numbness and tingling for approximately one month now.  She also denies any injury to her left arm as well.  She denies any other neurological deficits whatsoever.  Patient states that she has been taking some leftover hydrocodone that she already had at home with; which somewhat manage the pain.  Patient denies any nausea, vomiting, diarrhea, or constipation.  She denies any UTI symptoms.  Chest denies any recent fevers or chills.  HPI  ROS  Past Medical History  Diagnosis Date  . Hyperlipidemia   . Hypertension   . Breast cancer of lower-outer quadrant of right female breast 09/06/2014  . Wears glasses   . Wears dentures     top  . Breast cancer 09/07/2014    ER+/PR+/Her2- right breast    Past Surgical History  Procedure Laterality Date  . Tubal ligation    . Colonoscopy  07/27/12    mild diverticulosis  . Breast surgery  1980    rt br bx-no anesth  . Lumpectomy right breast Right 09/30/14    has  Breast cancer of lower-outer quadrant of right female breast and Hip pain on her problem list.    has No Known Allergies.    Medication List       This list is accurate as of: 12/22/14 11:59 PM.  Always use your most recent med list.               anastrozole 1 MG tablet  Commonly known as:  ARIMIDEX  Take 1 tablet (1 mg total) by mouth daily.     lisinopril 10 MG tablet  Commonly known as:  PRINIVIL,ZESTRIL  Take 10 mg by mouth daily.     Pitavastatin Calcium 4 MG Tabs  Take by mouth daily.         PHYSICAL EXAMINATION  Oncology Vitals 12/22/2014 12/19/2014 12/06/2014 11/29/2014 11/22/2014 11/15/2014 10/26/2014  Height - 152 cm - - - - 152 cm  Weight 90.13 kg 90.447 kg 90.992 kg 92.126 kg 89.676 kg 90.538 kg 89.812 kg  Weight (lbs) 198 lbs 11 oz 199 lbs 6 oz 200 lbs 10 oz 203 lbs 2 oz 197 lbs 11 oz 199 lbs 10 oz 198 lbs  BMI (kg/m2) - 38.94 kg/m2 - - - - 38.67 kg/m2  Temp 98 98 97.6 98 97.5 97.8 97.6  Pulse 67 69 74 71 66 75 67  Resp 18 19 - - - - -  SpO2 100 100 - - - - -  BSA (m2) - 1.96 m2 - - - - 1.95 m2   BP Readings from Last 3 Encounters:  12/22/14 129/83  12/19/14 146/82  12/06/14 132/85    Physical Exam  Constitutional: She is oriented to person, place, and time and well-developed, well-nourished, and in no distress.  HENT:  Head: Normocephalic and atraumatic.  Mouth/Throat: Oropharynx is clear and moist.  Eyes: Conjunctivae and EOM are normal. Pupils are equal, round, and reactive to light. Right eye exhibits no discharge. Left eye exhibits no discharge. No scleral icterus.  Neck: Normal range of motion. Neck supple. No JVD present. No tracheal deviation present. No thyromegaly present.  Cardiovascular: Normal rate, regular rhythm, normal heart sounds and intact distal pulses.   Pulmonary/Chest: Effort normal and breath sounds normal. No respiratory distress. She has no wheezes. She has no rales. She exhibits no tenderness.  Abdominal: Soft. Bowel sounds are  normal. She exhibits no distension and no mass. There is no tenderness. There is no rebound and no guarding.  Musculoskeletal: Normal range of motion. She exhibits no edema or tenderness.  Left upper extremity with no evidence of injury or infection.  Observed with full range of motion.  Lymphadenopathy:    She has no cervical adenopathy.  Neurological: She is alert and oriented to person, place, and time.  Patient has full range of motion; and is able to bear weight to her left lower extremity-but does occur, the use of a cane to ambulate.  Skin: Skin is warm and dry. No rash noted. No erythema. No pallor.  Psychiatric: Affect normal.  Nursing note and vitals reviewed.   LABORATORY DATA:. Appointment on 12/22/2014  Component Date Value Ref Range Status  . WBC 12/22/2014 7.8  3.9 - 10.3 10e3/uL Final  . NEUT# 12/22/2014 6.2  1.5 - 6.5 10e3/uL Final  . HGB 12/22/2014 13.2  11.6 - 15.9 g/dL Final  . HCT 12/22/2014 41.1  34.8 - 46.6 % Final  . Platelets 12/22/2014 158  145 - 400 10e3/uL Final  . MCV 12/22/2014 86.5  79.5 - 101.0 fL Final  . MCH 12/22/2014 27.7  25.1 - 34.0 pg Final  . MCHC 12/22/2014 32.1  31.5 - 36.0 g/dL Final  . RBC 12/22/2014 4.76  3.70 - 5.45 10e6/uL Final  . RDW 12/22/2014 13.9  11.2 - 14.5 % Final  . lymph# 12/22/2014 0.9  0.9 - 3.3 10e3/uL Final  . MONO# 12/22/2014 0.7  0.1 - 0.9 10e3/uL Final  . Eosinophils Absolute 12/22/2014 0.0  0.0 - 0.5 10e3/uL Final  . Basophils Absolute 12/22/2014 0.0  0.0 - 0.1 10e3/uL Final  . NEUT% 12/22/2014 79.2* 38.4 - 76.8 % Final  . LYMPH% 12/22/2014 11.2* 14.0 - 49.7 % Final  . MONO% 12/22/2014 8.8  0.0 - 14.0 % Final  . EOS% 12/22/2014 0.5  0.0 - 7.0 % Final  . BASO% 12/22/2014 0.3  0.0 - 2.0 % Final  . Sodium 12/22/2014 140  136 - 145 mEq/L Final  . Potassium 12/22/2014 3.9  3.5 - 5.1 mEq/L Final  . Chloride 12/22/2014 108  98 - 109 mEq/L Final  . CO2 12/22/2014 25  22 - 29 mEq/L Final  . Glucose 12/22/2014 103  70 - 140  mg/dl Final  . BUN 12/22/2014 9.6  7.0 - 26.0 mg/dL Final  . Creatinine 12/22/2014 0.8  0.6 - 1.1 mg/dL Final  . Total Bilirubin 12/22/2014 0.75  0.20 - 1.20 mg/dL Final  .  Alkaline Phosphatase 12/22/2014 56  40 - 150 U/L Final  . AST 12/22/2014 25  5 - 34 U/L Final  . ALT 12/22/2014 21  0 - 55 U/L Final  . Total Protein 12/22/2014 6.8  6.4 - 8.3 g/dL Final  . Albumin 12/22/2014 3.4* 3.5 - 5.0 g/dL Final  . Calcium 12/22/2014 10.1  8.4 - 10.4 mg/dL Final  . Anion Gap 12/22/2014 6  3 - 11 mEq/L Final  . EGFR 12/22/2014 87* >90 ml/min/1.73 m2 Final   eGFR is calculated using the CKD-EPI Creatinine Equation (2009)     RADIOGRAPHIC STUDIES: Ct Abdomen Pelvis W Contrast  12/27/2014   CLINICAL DATA:  Worsening left flank and back pain for 6 days. Right lower outer quadrant breast carcinoma, recently status post radiation therapy and lumpectomy.  EXAM: CT ABDOMEN AND PELVIS WITH CONTRAST  TECHNIQUE: Multidetector CT imaging of the abdomen and pelvis was performed using the standard protocol following bolus administration of intravenous contrast.  CONTRAST:  128m OMNIPAQUE IOHEXOL 300 MG/ML  SOLN  COMPARISON:  None.  FINDINGS: Lower Chest: No acute findings. Recent postop changes from right breast lumpectomy noted.  Hepatobiliary: No masses or other significant abnormality identified. Gallbladder is unremarkable.  Pancreas: No mass, inflammatory changes, or other significant abnormality identified.  Spleen:  Within normal limits in size and appearance.  Adrenals:  No masses identified.  Kidneys/Urinary Tract:  No evidence of masses or hydronephrosis.  Stomach/Bowel/Peritoneum: No evidence of wall thickening, mass, or obstruction. Sigmoid diverticulosis is noted, however there is no evidence of diverticulitis or other inflammatory process.  Vascular/Lymphatic: No pathologically enlarged lymph nodes identified. No abdominal aortic aneurysm or other significant retroperitoneal abnormality demonstrated.   Reproductive: Ill-defined soft tissue prominence seen in the region of the cervix and vagina which measures approximately 4 x 5 cm. Neoplasm such as cervical or vaginal carcinoma cannot be excluded.  Other:  None.  Musculoskeletal: No suspicious bone lesions identified. Advanced lower lumbar spine degenerative changes incidentally noted.  IMPRESSION: Ill-defined soft tissue prominence in the region of the cervix and vagina measuring approximately 4 x 5 cm. Neoplasm such as cervical or vaginal carcinoma cannot be excluded. Recommend correlation with pelvic exam findings; if clinically warranted, consider pelvic MRI without and with contrast for further imaging evaluation.  Colonic diverticulosis. No radiographic evidence of diverticulitis.   Electronically Signed   By: JEarle GellM.D.   On: 12/27/2014 12:12    ASSESSMENT/PLAN:    Hip pain Patient complaining of new onset acute left hip pain.  When she awoke this morning.  She states she is unable to walk on her left leg due to the end tends pain that radiates from her left low back and hip region down the back of her buttock and her leg.  She denies any new injury or trauma.  She is also complaining of some occasional/intermittent left arm numbness and tingling for approximately one month now.  She also denies any injury to her left arm as well.  She denies any other neurological deficits whatsoever.  Patient states that she has been taking some leftover hydrocodone that she already had at home with; which somewhat manage the pain.  Patient denies any nausea, vomiting, diarrhea, or constipation.  She denies any UTI symptoms.  Chest denies any recent fevers or chills.  On exam.-Patient appears intact; with no obvious injury or trauma to her left hip region.  There is no area of erythema, edema, warmth, tenderness, or red streaks.  Patient was observed  bearing weight with her left leg; but she was using a cane.  Patient's left arm appears intact with no  evidence of infection.  Patient has full range of motion with all extremities.  Obtained both a lumbar x-ray and a left hip/pelvis x-ray which revealed some degenerative changes; but no acute findings.  After careful review of all findings with Dr. Otelia Sergeant was made to obtain a CT with contrast of the abdomen and pelvis to be obtained as soon as possible for further evaluation.  Patient states she already has plenty of hydrocodone at home to use on an as-needed basis to manage her pain.  Patient was advised to call/return to the to the emergency department in the meantime if she develops any worsening symptoms whatsoever.  Breast cancer of lower-outer quadrant of right female breast Breast cancer of lower-outer quadrant of right female breast   09/01/2014 Initial Diagnosis Right breast invasive ductal carcinoma with DCIS, grade 2, ER 100%, P of 30%, Ki-67 30%, HER-2 negative ratio 1   09/30/2014 Surgery Right lumpectomy: 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 29%, HER-2 negative ratio 1, Ki-67 21%, T1 cN0 M0 stage IA, Oncotype Dx 17 (11%)   11/15/2014 - 12/09/2014 Radiation Therapy Adj XRT         Patient continues to anastrozole.  Oral therapy as instructed.  Advised patient would arrange for a CT with contrast of the abdomen/pelvis for further evaluation of new onset hip pain for as soon as possible.  Patient is scheduled to return for follow-up visit with Dr. Lindi Adie on 04/10/2015.  Patient stated understanding of all instructions; and was in agreement with this plan of care. The patient knows to call the clinic with any problems, questions or concerns.   Review/collaboration with Dr. Lindi Adie regarding all aspects of patient's visit today.   Total time spent with patient was 40 minutes;  with greater than 75 percent of that time spent in face to face counseling regarding patient's symptoms,  and coordination of care and follow up.  Disclaimer: This note  was dictated with voice recognition software. Similar sounding words can inadvertently be transcribed and may not be corrected upon review.   Drue Second, NP 12/30/2014

## 2014-12-30 NOTE — Telephone Encounter (Signed)
Notified Dr. Geralyn Flash nurse of CT results from July 5- mass in pelvic region measuring 4-5 cm. Follow up with Dr. Lindi Adie scheduled for October, pt has not received scan results.

## 2014-12-30 NOTE — Assessment & Plan Note (Signed)
Patient complaining of new onset acute left hip pain.  When she awoke this morning.  She states she is unable to walk on her left leg due to the end tends pain that radiates from her left low back and hip region down the back of her buttock and her leg.  She denies any new injury or trauma.  She is also complaining of some occasional/intermittent left arm numbness and tingling for approximately one month now.  She also denies any injury to her left arm as well.  She denies any other neurological deficits whatsoever.  Patient states that she has been taking some leftover hydrocodone that she already had at home with; which somewhat manage the pain.  Patient denies any nausea, vomiting, diarrhea, or constipation.  She denies any UTI symptoms.  Chest denies any recent fevers or chills.  On exam.-Patient appears intact; with no obvious injury or trauma to her left hip region.  There is no area of erythema, edema, warmth, tenderness, or red streaks.  Patient was observed bearing weight with her left leg; but she was using a cane.  Patient's left arm appears intact with no evidence of infection.  Patient has full range of motion with all extremities.  Obtained both a lumbar x-ray and a left hip/pelvis x-ray which revealed some degenerative changes; but no acute findings.  After careful review of all findings with Dr. Otelia Sergeant was made to obtain a CT with contrast of the abdomen and pelvis to be obtained as soon as possible for further evaluation.  Patient states she already has plenty of hydrocodone at home to use on an as-needed basis to manage her pain.  Patient was advised to call/return to the to the emergency department in the meantime if she develops any worsening symptoms whatsoever.

## 2014-12-30 NOTE — Telephone Encounter (Signed)
TC from patient inquiring about results of CT scan done on 12/27/14. Requesting that Dr. Lindi Adie call her as soon as possible. Please call patient today-she is anxious about results

## 2014-12-30 NOTE — Assessment & Plan Note (Addendum)
Breast cancer of lower-outer quadrant of right female breast   09/01/2014 Initial Diagnosis Right breast invasive ductal carcinoma with DCIS, grade 2, ER 100%, P of 30%, Ki-67 30%, HER-2 negative ratio 1   09/30/2014 Surgery Right lumpectomy: 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 29%, HER-2 negative ratio 1, Ki-67 21%, T1 cN0 M0 stage IA, Oncotype Dx 17 (11%)   11/15/2014 - 12/09/2014 Radiation Therapy Adj XRT         Patient continues to anastrozole.  Oral therapy as instructed.  Advised patient would arrange for a CT with contrast of the abdomen/pelvis for further evaluation of new onset hip pain for as soon as possible.  Patient is scheduled to return for follow-up visit with Dr. Lindi Adie on 04/10/2015.

## 2015-01-10 ENCOUNTER — Other Ambulatory Visit: Payer: Self-pay | Admitting: *Deleted

## 2015-01-10 ENCOUNTER — Telehealth: Payer: Self-pay | Admitting: Hematology and Oncology

## 2015-01-10 DIAGNOSIS — C50511 Malignant neoplasm of lower-outer quadrant of right female breast: Secondary | ICD-10-CM

## 2015-01-10 NOTE — Telephone Encounter (Signed)
Referral to gyn placed in epic and pof sent for referral

## 2015-01-10 NOTE — Telephone Encounter (Signed)
Pt came in after having CT done, states she needs to see the Dr. as soon as possible. Pt states she was advised that she needs to see a gynelogist. Sent msg to MD concerning this matter.... Advised pt we would call her back once we heard something.... KJ

## 2015-01-10 NOTE — Telephone Encounter (Signed)
Pt came in to discuss referral to Gynecologist I advised I didn't see one on here for an order or referral, sent msg to MD/desk nurse and Triage advised pt we would contact them once this is decided .Marland Kitchen... KJ

## 2015-01-18 ENCOUNTER — Telehealth: Payer: Self-pay

## 2015-01-18 NOTE — Telephone Encounter (Signed)
Bone density results dtd 01/10/15 rcvd from [place].  Reviewed by Dr. Lindi Adie.  Sent to scan.

## 2015-01-26 ENCOUNTER — Other Ambulatory Visit: Payer: Self-pay | Admitting: *Deleted

## 2015-01-26 ENCOUNTER — Ambulatory Visit
Admission: RE | Admit: 2015-01-26 | Discharge: 2015-01-26 | Disposition: A | Discharge status: Home / Self Care | Payer: BC Managed Care – PPO | Source: Ambulatory Visit | Attending: Radiation Oncology | Admitting: Radiation Oncology

## 2015-01-26 VITALS — BP 153/83 | HR 57 | Temp 98.1°F | Wt 201.1 lb

## 2015-01-26 DIAGNOSIS — C50511 Malignant neoplasm of lower-outer quadrant of right female breast: Secondary | ICD-10-CM

## 2015-01-26 NOTE — Progress Notes (Signed)
   Department of Radiation Oncology  Phone:  [phone] Fax:        [phone]   Name: Cynthia Barton MRN: [id number]  DOB: 12/21/1945  Date: 01/26/2015  Follow Up Visit Note  Diagnosis: Breast cancer of lower-outer quadrant of right female breast   Staging form: Breast, AJCC 7th Edition     Clinical stage from 09/07/2014: Stage IA (T1c, N0, M0) - Unsigned       Staging comments: Staged at breast conference on 3.16.16      Pathologic stage from 10/03/2014: Stage IA (T1c, N0, cM0) - Signed by Enid Cutter, MD on 10/06/2014       Staging comments: Staged on final lumpectomy specimen by Dr. Saralyn Pilar  Summary and Interval since last radiation: The patient's radiation treatment dates took place from  11/10/2014 to 12/09/2014. The site and dose included the right breast at 42.72 Gy at 2.67 Gy per fraction x 21 fractions and the right breast boost at 10 Gy at 2 Gy per fraction x 5 fractions. The beams and energy included opposed tangents with reduced fields at 6 and 10 MV photons and three field photon boost with 10 MV photons.   Interval History: Cynthia Barton presents today for routine followup.One month folllow up radiation to right breast. It is observed that the patient's skin has healed with mild discoloration. The patient has been instructed to apply loton with Vitamin E. The patient and her daughter vocalized concern in waiting for an appointment with the GYN. A mass was reported on the patient's most recent pelvis scan. This scan occured after patient sustained a fall at work. Cynthia Barton has started Anastrozole. Some soreness in the lateral aspect of her right breast, particularly at night when the patient is sleeping was reported. The patient projects a healthy mental status and was accompanied by her daughter for today's visit.  Physical Exam:  Filed Vitals:   01/26/15 0825  BP: 153/83  Pulse: 57  Temp: 98.1 F (36.7 C)  Weight: 201 lb 1.6 oz (91.218 kg)   Breast: The skin is healing well. There  is minor hyperer pigmentation observed in the inframammary fold during today's appointment.  IMPRESSION: Cynthia Barton is a 69 y.o. female presenting to clinic in regards to her cancer of the right female breast. The patient is tolerating the prescribed medication Anastrozole.  PLAN:  The patient is doing well. We discussed the need for follow up every 4-6 months which she has scheduled. The necessity of yearly mammograms which she can schedule with her OBGYN or with medical oncology was fully discussed. The need for sun protection in the treated area was fully discussed. The patient has been advised to continue self administration of the prescribed medication Anastrozole. If the patient or her daughter develop any further questions or concerns, they have been encouraged to contact Dr. Pablo Ledger. The patient has been advised to follow up with radiation oncology on an as needed basis. Dr. Lindi Adie will follow up with the patient in regards to her GYN appointment.   This document serves as a record of services personally performed by Thea Silversmith , MD. It was created on her behalf by Lenn Cal, a trained medical scribe. The creation of this record is based on the scribe's personal observations and the provider's statements to them. This document has been checked and approved by the attending provider.   ------------------------------------------------  Thea Silversmith, MD

## 2015-01-26 NOTE — Progress Notes (Signed)
One month folllow up radiation to right breast.Skin has healed.milddiscoloration.To apply loton with vitamin e.Waiting for appointment with gyn as mass seen on ct pelvis scan found after patient sustained fall at work..Has started anastrozole.

## 2015-01-31 ENCOUNTER — Ambulatory Visit (INDEPENDENT_AMBULATORY_CARE_PROVIDER_SITE_OTHER): Payer: BC Managed Care – PPO | Admitting: Gynecology

## 2015-01-31 ENCOUNTER — Other Ambulatory Visit (HOSPITAL_COMMUNITY)
Admission: RE | Admit: 2015-01-31 | Discharge: 2015-01-31 | Disposition: A | Discharge status: Home / Self Care | Payer: BC Managed Care – PPO | Source: Ambulatory Visit | Attending: Gynecology | Admitting: Gynecology

## 2015-01-31 ENCOUNTER — Encounter: Payer: Self-pay | Admitting: Gynecology

## 2015-01-31 VITALS — BP 130/80 | Ht 60.0 in | Wt 201.0 lb

## 2015-01-31 DIAGNOSIS — N814 Uterovaginal prolapse, unspecified: Secondary | ICD-10-CM

## 2015-01-31 DIAGNOSIS — Z124 Encounter for screening for malignant neoplasm of cervix: Secondary | ICD-10-CM | POA: Diagnosis not present

## 2015-01-31 DIAGNOSIS — N952 Postmenopausal atrophic vaginitis: Secondary | ICD-10-CM

## 2015-01-31 DIAGNOSIS — Z01411 Encounter for gynecological examination (general) (routine) with abnormal findings: Secondary | ICD-10-CM | POA: Diagnosis not present

## 2015-01-31 NOTE — Patient Instructions (Signed)
Follow up for ultrasound as scheduled 

## 2015-01-31 NOTE — Addendum Note (Signed)
Addended by: Nelva Nay on: 01/31/2015 09:28 AM   Modules accepted: Orders

## 2015-01-31 NOTE — Progress Notes (Signed)
Cynthia Barton [birth year] [id number]        69 y.o.  G2P2 new patient seen in consultation due to recent CT scan in July because of flank and back pain showed a "ill-defined soft tissue prominence in the region of the cervix and vagina measuring 4-5 cm".  No other abnormalities to include no adenopathy noted.  It is been approximately 3 years since her last pelvic exam by her history. No history of abnormal Pap smears previously with reported Pap smear 3 years ago. No vaginal bleeding or pelvic pain. Does have a history of uterine prolapse for a number of years stable and tolerable. Actively being followed for breast cancer.  Past medical history,surgical history, problem list, medications, allergies, family history and social history were all reviewed and documented in the EPIC chart.  Directed ROS with pertinent positives and negatives documented in the history of present illness/assessment and plan.  Exam: Kim assistant Filed Vitals:   01/31/15 0827  BP: 130/80  Height: 5' (1.524 m)  Weight: 201 lb (91.173 kg)   General appearance:  Normal Abdomen soft nontender without masses guarding rebound Pelvic external BUS vagina with atrophic changes. Second-degree uterine prolapse with cervix at the introitus. Pap smear done.  No significant cystocele. Uterus grossly normal size midline mobile. Adnexa without masses or tenderness. Exam somewhat compromised due to abdominal girth. Rectal exam is normal without significant rectocele.  Assessment/Plan:  69 y.o. G2P2 with uterine prolapse which is what I believe they were seeing with the CT scan. Plan pelvic ultrasound for ovarian assessment. Pap smear done today. Discussed treatment options for her prolapse to include observation as it appears to have remained stable over years time. Alternatives to include trial of pessary noting she is not sexually active up to including surgery to include TVH possible A&P repair. Vagina does appear to be fairly well  supported. At this point patient is not interested in doing anything but wants to think of her options. She'll follow up for the ultrasound and then go from there.    Anastasio Auerbach MD, 9:02 AM 01/31/2015

## 2015-02-01 LAB — CYTOLOGY - PAP

## 2015-02-21 ENCOUNTER — Other Ambulatory Visit: Payer: Self-pay | Admitting: Nurse Practitioner

## 2015-02-21 DIAGNOSIS — C50511 Malignant neoplasm of lower-outer quadrant of right female breast: Secondary | ICD-10-CM

## 2015-02-22 ENCOUNTER — Encounter: Payer: Self-pay | Admitting: Gynecology

## 2015-02-22 ENCOUNTER — Other Ambulatory Visit: Payer: Self-pay | Admitting: Gynecology

## 2015-02-22 ENCOUNTER — Ambulatory Visit (INDEPENDENT_AMBULATORY_CARE_PROVIDER_SITE_OTHER): Payer: BC Managed Care – PPO

## 2015-02-22 ENCOUNTER — Ambulatory Visit (INDEPENDENT_AMBULATORY_CARE_PROVIDER_SITE_OTHER): Payer: BC Managed Care – PPO | Admitting: Gynecology

## 2015-02-22 VITALS — BP 124/80

## 2015-02-22 DIAGNOSIS — D251 Intramural leiomyoma of uterus: Secondary | ICD-10-CM | POA: Diagnosis not present

## 2015-02-22 DIAGNOSIS — N814 Uterovaginal prolapse, unspecified: Secondary | ICD-10-CM

## 2015-02-22 DIAGNOSIS — Z853 Personal history of malignant neoplasm of breast: Secondary | ICD-10-CM

## 2015-02-22 NOTE — Progress Notes (Signed)
Heidee Sciuto [birth year] [id number]        69 y.o.  G2P2 with history of recent CT due to flank and back pain showing "ill-defined soft tissue prominence in the region of the cervix and vagina measuring 4-5 cm". Pelvic exam shows uterine prolapse with cervix at the introitus. Ultrasound ordered to rule out ovarian process or other pathology.  Past medical history,surgical history, problem list, medications, allergies, family history and social history were all reviewed and documented in the EPIC chart.  Directed ROS with pertinent positives and negatives documented in the history of present illness/assessment and plan.  Exam: Filed Vitals:   02/22/15 1453  BP: 124/80   General appearance:  Normal  Ultrasound shows uterus normal size and echotexture. Small 14 mm myoma. Endometrial echo 1.7 mm. Right and left ovaries visualized and atrophic. Cul-de-sac negative.  Assessment/Plan:  69 y.o. G2P2 with above history with normal ultrasound. I suspect the CT scan was showing the uterus within the vaginal canal. Patient comfortable doing nothing as far as the prolapse is concerned. Will follow up in one year for annual exam follow up sooner if any issues.    Anastasio Auerbach MD, 3:10 PM 02/22/2015

## 2015-02-22 NOTE — Patient Instructions (Signed)
Follow up routinely when you're due for annual exam.

## 2015-02-24 ENCOUNTER — Telehealth: Payer: Self-pay | Admitting: Hematology and Oncology

## 2015-02-24 NOTE — Telephone Encounter (Signed)
Called and left a message with survivorship appointment °

## 2015-04-09 NOTE — Assessment & Plan Note (Signed)
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 29%, HER-2 negative ratio 1, Ki-67 21%, T1 cN0 M0 stage IA Oncotype Dx 17 (Low Risk) S/P XRT  Recommendation: Antiestrogen therapy once daily for 5 years  RTC in 1 month

## 2015-04-10 ENCOUNTER — Telehealth: Payer: Self-pay | Admitting: Hematology and Oncology

## 2015-04-10 ENCOUNTER — Encounter: Payer: Self-pay | Admitting: Hematology and Oncology

## 2015-04-10 ENCOUNTER — Ambulatory Visit (HOSPITAL_BASED_OUTPATIENT_CLINIC_OR_DEPARTMENT_OTHER): Payer: BC Managed Care – PPO | Admitting: Hematology and Oncology

## 2015-04-10 VITALS — BP 151/79 | HR 87 | Temp 98.2°F | Resp 18 | Ht 60.0 in | Wt 208.3 lb

## 2015-04-10 DIAGNOSIS — Z79811 Long term (current) use of aromatase inhibitors: Secondary | ICD-10-CM | POA: Diagnosis not present

## 2015-04-10 DIAGNOSIS — C50511 Malignant neoplasm of lower-outer quadrant of right female breast: Secondary | ICD-10-CM | POA: Diagnosis not present

## 2015-04-10 DIAGNOSIS — Z170 Estrogen receptor positive status [ER+]: Secondary | ICD-10-CM

## 2015-04-10 NOTE — Addendum Note (Signed)
Addended by: Prentiss Bells on: 04/10/2015 05:19 PM   Modules accepted: Medications

## 2015-04-10 NOTE — Telephone Encounter (Signed)
Gave patient avs report and appointments for April 2017 and November 2016 in [place].

## 2015-04-10 NOTE — Progress Notes (Signed)
Patient Care Team: Glendale Chard, MD as PCP - General (Internal Medicine) Autumn Messing III, MD as Consulting Physician (General Surgery) Nicholas Lose, MD as Consulting Physician (Hematology and Oncology) Thea Silversmith, MD as Consulting Physician (Radiation Oncology) Rockwell Germany, RN as Registered Nurse Mauro Kaufmann, RN as Registered Nurse Holley Bouche, NP as Nurse Practitioner (Nurse Practitioner)  DIAGNOSIS: Breast cancer of lower-outer quadrant of right female breast [facility])   Staging form: Breast, AJCC 7th Edition     Clinical stage from 09/07/2014: Stage IA (T1c, N0, M0) - Unsigned       Staging comments: Staged at breast conference on 3.16.16      Pathologic stage from 10/03/2014: Stage IA (T1c, N0, cM0) - Signed by Enid Cutter, MD on 10/06/2014       Staging comments: Staged on final lumpectomy specimen by Dr. Saralyn Pilar    SUMMARY OF ONCOLOGIC HISTORY:   Breast cancer of lower-outer quadrant of right female breast ([place])   09/01/2014 Initial Diagnosis Right breast invasive ductal carcinoma with DCIS, grade 2, ER 100%, P of 30%, Ki-67 30%, HER-2 negative ratio 1   09/30/2014 Surgery Right lumpectomy: 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 29%, HER-2 negative ratio 1, Ki-67 21%, T1 cN0 M0 stage IA, Oncotype Dx 17 (11%)   11/15/2014 - 12/09/2014 Radiation Therapy Adj XRT   12/23/2014 -  Anti-estrogen oral therapy Anastrozole 1 mg daily    CHIEF COMPLIANT: follow-up on anastrozole  INTERVAL HISTORY: Cynthia Barton is a 69 year old with above-mentioned history of right breast cancer. Lumpectomy adjuvant radiation and is currently anastrozole. She is tolerating it fairly well without any major process. She does have occasional hot flashes. She had fallen and injured her leg at work and is still having difficulty with walking.otherwise she denies any lumps or nodules in the breasts.  REVIEW OF SYSTEMS:   Constitutional: Denies fevers, chills or abnormal weight loss Eyes: Denies  blurriness of vision Ears, nose, mouth, throat, and face: Denies mucositis or sore throat Respiratory: Denies cough, dyspnea or wheezes Cardiovascular: Denies palpitation, chest discomfort or lower extremity swelling Gastrointestinal:  Denies nausea, heartburn or change in bowel habits Skin: Denies abnormal skin rashes Lymphatics: Denies new lymphadenopathy or easy bruising Neurological:Denies numbness, tingling or new weaknesses Behavioral/Psych: Mood is stable, no new changes  Breast:  denies any pain or lumps or nodules in either breasts All other systems were reviewed with the patient and are negative.  I have reviewed the past medical history, past surgical history, social history and family history with the patient and they are unchanged from previous note.  ALLERGIES:  has No Known Allergies.  MEDICATIONS:  Current Outpatient Prescriptions  Medication Sig Dispense Refill  . anastrozole (ARIMIDEX) 1 MG tablet Take 1 tablet (1 mg total) by mouth daily. 90 tablet 3  . lisinopril (PRINIVIL,ZESTRIL) 10 MG tablet Take 10 mg by mouth daily.    . Pitavastatin Calcium 4 MG TABS Take by mouth daily.     No current facility-administered medications for this visit.    PHYSICAL EXAMINATION: ECOG PERFORMANCE STATUS: 1 - Symptomatic but completely ambulatory  Filed Vitals:   04/10/15 1447  BP: 151/79  Pulse: 87  Temp: 98.2 F (36.8 C)  Resp: 18   Filed Weights   04/10/15 1447  Weight: 208 lb 4.8 oz (94.484 kg)    GENERAL:alert, no distress and comfortable SKIN: skin color, texture, turgor are normal, no rashes or significant lesions EYES: normal, Conjunctiva are pink and non-injected, sclera  clear OROPHARYNX:no exudate, no erythema and lips, buccal mucosa, and tongue normal  NECK: supple, thyroid normal size, non-tender, without nodularity LYMPH:  no palpable lymphadenopathy in the cervical, axillary or inguinal LUNGS: clear to auscultation and percussion with normal breathing  effort HEART: regular rate & rhythm and no murmurs and no lower extremity edema ABDOMEN:abdomen soft, non-tender and normal bowel sounds Musculoskeletal:no cyanosis of digits and no clubbing  NEURO: alert & oriented x 3 with fluent speech, no focal motor/sensory deficits   LABORATORY DATA:  I have reviewed the data as listed   Chemistry      Component Value Date/Time   NA 140 12/22/2014 1344   K 3.9 12/22/2014 1344   CO2 25 12/22/2014 1344   BUN 9.6 12/22/2014 1344   CREATININE 0.8 12/22/2014 1344      Component Value Date/Time   CALCIUM 10.1 12/22/2014 1344   ALKPHOS 56 12/22/2014 1344   AST 25 12/22/2014 1344   ALT 21 12/22/2014 1344   BILITOT 0.75 12/22/2014 1344       Lab Results  Component Value Date   WBC 7.8 12/22/2014   HGB 13.2 12/22/2014   HCT 41.1 12/22/2014   MCV 86.5 12/22/2014   PLT 158 12/22/2014   NEUTROABS 6.2 12/22/2014    ASSESSMENT & PLAN:  Breast cancer of lower-outer quadrant of right female breast Right lumpectomy 09/30/2014: 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 29%, HER-2 negative ratio 1, Ki-67 21%, T1 cN0 M0 stage IA Oncotype Dx 17 (Low Risk) S/P XRT  Current treatment: Antiestrogen therapy once daily for 5 years started 12/23/2014   Anastrozole toxicities: 1. Occasional hot flashes 2. Muscle aches and pains especially left lower extremity this started after she fell at work. Received physical therapy.  Breast cancer surveillance: Annual mammograms every 6 month breast exams. Survivorship: I encouraged her to join the [employer] live strong program as well as [facility] program. Return to clinic in 6 months for follow-up.   No orders of the defined types were placed in this encounter.   The patient has a good understanding of the overall plan. she agrees with it. she will call with any problems that may develop before the next visit here.   Rulon Eisenmenger, MD 04/10/2015     He is Currently the

## 2015-04-11 ENCOUNTER — Encounter: Payer: Self-pay | Admitting: Hematology and Oncology

## 2015-04-11 NOTE — Progress Notes (Signed)
I placed cancer policy form on desk of nurse for dr. Lindi Adie. I will let daughter know when ready.

## 2015-04-17 ENCOUNTER — Encounter: Payer: Self-pay | Admitting: Hematology and Oncology

## 2015-04-17 NOTE — Progress Notes (Signed)
I called to let Cynthia Barton the daughter know the forms are ready for pick up. The nurse also faxed them

## 2015-05-09 ENCOUNTER — Ambulatory Visit (HOSPITAL_BASED_OUTPATIENT_CLINIC_OR_DEPARTMENT_OTHER): Payer: BC Managed Care – PPO | Admitting: Nurse Practitioner

## 2015-05-09 ENCOUNTER — Encounter: Payer: Self-pay | Admitting: Nurse Practitioner

## 2015-05-09 VITALS — BP 155/80 | HR 66 | Temp 97.5°F | Resp 20 | Ht 60.0 in | Wt 210.3 lb

## 2015-05-09 DIAGNOSIS — Z170 Estrogen receptor positive status [ER+]: Secondary | ICD-10-CM | POA: Diagnosis not present

## 2015-05-09 DIAGNOSIS — C50511 Malignant neoplasm of lower-outer quadrant of right female breast: Secondary | ICD-10-CM

## 2015-05-09 DIAGNOSIS — M8580 Other specified disorders of bone density and structure, unspecified site: Secondary | ICD-10-CM

## 2015-05-09 DIAGNOSIS — Z79811 Long term (current) use of aromatase inhibitors: Secondary | ICD-10-CM | POA: Diagnosis not present

## 2015-05-09 NOTE — Progress Notes (Signed)
CLINIC:  Cancer Survivorship   REASON FOR VISIT:  Routine follow-up post-treatment for a recent history of breast cancer.  BRIEF ONCOLOGIC HISTORY:    Breast cancer of lower-outer quadrant of right female breast ([place])   08/31/2014 Mammogram Right breast: irregular mass with spiculated margin and heterogenous calcifications central to the nipple, middle depth   08/31/2014 Breast US Right breast: 1.5 cm irregular mass, spiculared margin, right breast LOQ, middle depth, hypoechoic with posterior acoustic shadowing   09/01/2014 Initial Biopsy Right breast core needle bx: invasive ductal carcinoma with DCIS, grade 2, ER+ (100%), PR+ (29%), HER-2 negative (ratio 1.00), Ki-67 21%,    09/08/2014 Breast MRI Right breast: There is a spiculated enhancing mass with washout kinetics in the lateral right breast at the level of the nipple mid to posterior third depth measuring 1.7 x 1.8 x 1.7 cm with associated clip   09/08/2014 Clinical Stage Stage IA: T1c N0   09/30/2014 Definitive Surgery Right lumpectomy/[facility] Marlou Starks): Invasive ductal carcinoma 1.8 cm with DCIS ER+ (100%), PR+ (29%), HER-2 repeated and remains negative (ratio 1.00), Ki-67 21%. 1 N removed and negative for malignancy (0/1).   09/30/2014 Pathologic Stage Stage IA: pT1c pN0    09/30/2014 Oncotype testing Score: 17 (11% ROR). No chemotherapy.   11/10/2014 - 12/09/2014 Radiation Therapy Adjuvant RT Pablo Ledger): Right breast/ 42.72 Gy over 21 fractions. Right breast boost/ 10 Gy over 5 fractions   12/23/2014 -  Anti-estrogen oral therapy Anastrozole 1 mg daily. Planned duration of therapy 5-10 years.    INTERVAL HISTORY:  Ms. Lukasik presents to the [facility] today for our initial meeting to review her survivorship care plan detailing her treatment course for breast cancer, as well as monitoring long-term side effects of that treatment, education regarding health maintenance, screening, and overall wellness and health promotion.     Overall, Ms.  Lager reports feeling quite well since completing her radiation therapy approximately four and a half months ago.  She began her anastrozole in July 2016 and is tolerating it fairly well. She is experiencing hot flashes, but states that they are short-lived and do not interfere with her sleep. She denies any headache, cough, or shortness of breath. She is continuing with left hip pain that resulted from a fall at work a few months ago. Ms. Mitzi Hansen describes the pain as crampy and it occurs intermittently. She feels that the pain is improving and she is continuing with her physical therapy.  She reports a good appetite and denies any weight loss.  She reports that she will occasionally have some warmth along her right breast at the site of radiation, but denies any pain or swelling.  She has noticed thickening within her right breast as well, following radiation, but denies any mass or nodule.  REVIEW OF SYSTEMS:  General: Denies fever, chills, or generalized fatigue.  Cardiac: Denies palpitations, chest pain, and lower extremity edema.  Respiratory: Denies dyspnea on exertion.  GI: Denies abdominal pain, constipation, diarrhea, nausea, or vomiting.  GU: Denies dysuria, hematuria, vaginal bleeding, vaginal discharge, or vaginal dryness.  Musculoskeletal: Left hip pain as above, otherwise denies joint or bone pain.  Neuro: Denies recent falls or peripheral neuropathy. Skin: Denies rash, pruritis, or open wounds.  Breast: Warm as above. Thickening post radiation as above, otherwise, denies any new nodularity, masses, tenderness, nipple changes, or nipple discharge.  Psych: Denies depression, anxiety, insomnia, or memory loss.   A 14-point review of systems was completed and was negative, except as noted  above.   ONCOLOGY TREATMENT TEAM:  1. Surgeon:  Dr. Marlou Starks at [facility]  2. Medical Oncologist: Dr. Lindi Adie 3. Radiation Oncologist: Dr. Pablo Ledger    PAST MEDICAL/SURGICAL HISTORY:    Past Medical History  Diagnosis Date  . Hyperlipidemia   . Hypertension   . Breast cancer of lower-outer quadrant of right female breast ([place]) 09/06/2014  . Wears glasses   . Wears dentures     top  . Breast cancer ([place]) 09/07/2014    ER+/PR+/Her2- right breast  . Radiation 11/10/14-12/09/14    Right Breast  . Uterine prolapse 2016   Past Surgical History  Procedure Laterality Date  . Tubal ligation    . Colonoscopy  07/27/12    mild diverticulosis  . Breast surgery  1980    rt br bx-no anesth  . Lumpectomy right breast Right 09/30/14     ALLERGIES:  No Known Allergies   CURRENT MEDICATIONS:  Current Outpatient Prescriptions on File Prior to Visit  Medication Sig Dispense Refill  . anastrozole (ARIMIDEX) 1 MG tablet Take 1 tablet (1 mg total) by mouth daily. 90 tablet 3  . lisinopril (PRINIVIL,ZESTRIL) 10 MG tablet Take 10 mg by mouth daily.    . Pitavastatin Calcium 4 MG TABS Take by mouth daily.     No current facility-administered medications on file prior to visit.     ONCOLOGIC FAMILY HISTORY:  Family History  Problem Relation Age of Onset  . Heart disease Father   . Melanoma Mother   . Kidney failure Sister 13  . Heart disease Maternal Uncle      GENETIC COUNSELING/TESTING: None  SOCIAL HISTORY:  Cala Glassco is divorced and lives with her daughter in [place], [place].  She has 3 children. Ms. Kierstead is currently working at [employer], although not working at present following her fall in June 2016. She denies any current or history of tobacco or illicit drug use.  She does drink wine occasionally.   PHYSICAL EXAMINATION:  Vital Signs:   Filed Vitals:   05/09/15 1014  BP: 155/80  Pulse: 66  Temp: 97.5 F (36.4 C)  Resp: 20   General: Well-nourished, well-appearing female in no acute distress.  She is accompanied in clinic  Today by her daughter, Landry Dyke. HEENT: Head is atraumatic and normocephalic.  Pupils equal and reactive to light and  accomodation. Conjunctivae clear without exudate.  Sclerae anicteric. Oral mucosa is pink, moist, and intact without lesions.  Oropharynx is pink without lesions or erythema.  Lymph: No cervical, supraclavicular, infraclavicular, or axillary lymphadenopathy noted on palpation.  Cardiovascular: Regular rate and rhythm without murmurs, rubs, or gallops. Respiratory: Clear to auscultation bilaterally. Chest expansion symmetric without accessory muscle use on inspiration or expiration.  Breast: Skin over right breast slightly darkened secondary to radiation.  Skin over right breast warm to touch without edema, lesion, or rash.   GI: Abdomen soft and round. No tenderness to palpation. Bowel sounds normoactive in 4 quadrants.  GU: Deferred.  Musculoskeletal: Muscle strength 5/5 in all extremities.   Neuro: No focal deficits. Steady gait.  Psych: Mood and affect normal and appropriate for situation.  Extremities: No edema, cyanosis, or clubbing.  Skin: Warm and dry. No open lesions noted.   LABORATORY DATA:  None for this visit.  DIAGNOSTIC IMAGING:  None for this visit.     ASSESSMENT AND PLAN:   1. History of breast cancer: Stage IA invasive ductal carcinoma of the right breast with DCIS, S/P lumpectomy followed  by adjuvant radiation therapy, now on adjuvant endocrine therapy with anastrozole with planned duration of therapy 5-10 years. Ms. Ketchum is doing well without clinical symptoms worrisome for disease recurrence at this time. She will follow-up with her medical oncologist,  Dr. Lindi Adie, in April 2017 with history and physical exam per surveillance protocol.  She will continue her anti-estrogen therapy with anastrozole as prescribed by  Dr. Lindi Adie. She was instructed to make Dr. Lindi Adie or myself aware if she begins to experience any worsening side effects of the medication and I could see her back in clinic to help manage those side effects, as needed. A comprehensive survivorship care plan and  treatment summary was reviewed with the patient today detailing her breast cancer diagnosis, treatment course, potential late/long-term effects of treatment, appropriate follow-up care with recommendations for the future, and patient education resources.  A copy of this summary, along with a letter will be sent to the patient's primary care provider via in basket message after today's visit.  Ms. Pike is welcome to return to the Survivorship Clinic in the future, as needed; no follow-up will be scheduled at this time.    2. Bone health:  Given Ms. Wehrman's age/history of breast cancer and her current treatment regimen including anti-estrogen therapy with anastrozole she is at risk for bone demineralization.  Per our records, her last DEXA scan was performed in July 2016 revealing osteopenia.  We will continue to monitor this every 1-2 years. In the meantime, she was encouraged to increase her consumption of foods rich in calcium and vitamin D as well as to increase her weight-bearing activities. She takes vitamin D daily as well as a multivitamin.  She was given education on specific activities to promote bone health.  3. Cancer screening:  Due to Ms. Kagan's history and her age, she should receive screening for skin cancers, colon cancer, and gynecologic cancers.  The information and recommendations are listed on the patient's comprehensive care plan/treatment summary and were reviewed in detail with the patient.  She sees [facility] Phineas [facility] at [facility].  4. Health maintenance and wellness promotion: Ms. Staron was encouraged to consume 5-7 servings of fruits and vegetables per day. We reviewed the "Nutrition Rainbow" handout, as well as discussing strategies to maximize nutrition including minimizing red meat intake and the intake of processed foods.  She was also encouraged to engage in moderate to vigorous exercise for 30 minutes per day most days of the week. We discussed the [employer]  fitness program, which is designed for cancer survivors to help them become more physically fit after cancer treatments.  She was instructed to limit her alcohol consumption and continue to abstain from tobacco use. She has had her annual influenza vaccination.   5. Support services/counseling: It is not uncommon for this period of the patient's cancer care trajectory to be one of many emotions and stressors.  We discussed an opportunity for her to participate in the next session of [facility] ("Finding Your New Normal") support group series designed for patients after they have completed treatment.   Ms. Pittman was encouraged to take advantage of our many other support services programs, support groups, and/or counseling in coping with her new life as a cancer survivor after completing anti-cancer treatment.  She was offered support today through active listening and expressive supportive counseling.  She was given information regarding our available services and encouraged to contact me with any questions or for help enrolling in any of our support group/programs.  A total of 50 minutes of face-to-face time was spent with this patient with greater than 50% of that time in counseling and care-coordination.   Sylvan Cheese, NP  Survivorship Program [facility] [phone]   Note: PRIMARY CARE PROVIDER [name], MD [phone]

## 2015-08-07 IMAGING — CR DG LUMBAR SPINE COMPLETE 4+V
5 series · 5 of 5 positions shown · non-contrast
Comparison: None.

CLINICAL DATA: Low back pain. No history of recent trauma. History
breast carcinoma

EXAM:
LUMBAR SPINE - COMPLETE 4+ VIEW

[t l-spine oblique exposure (1 of 3)]
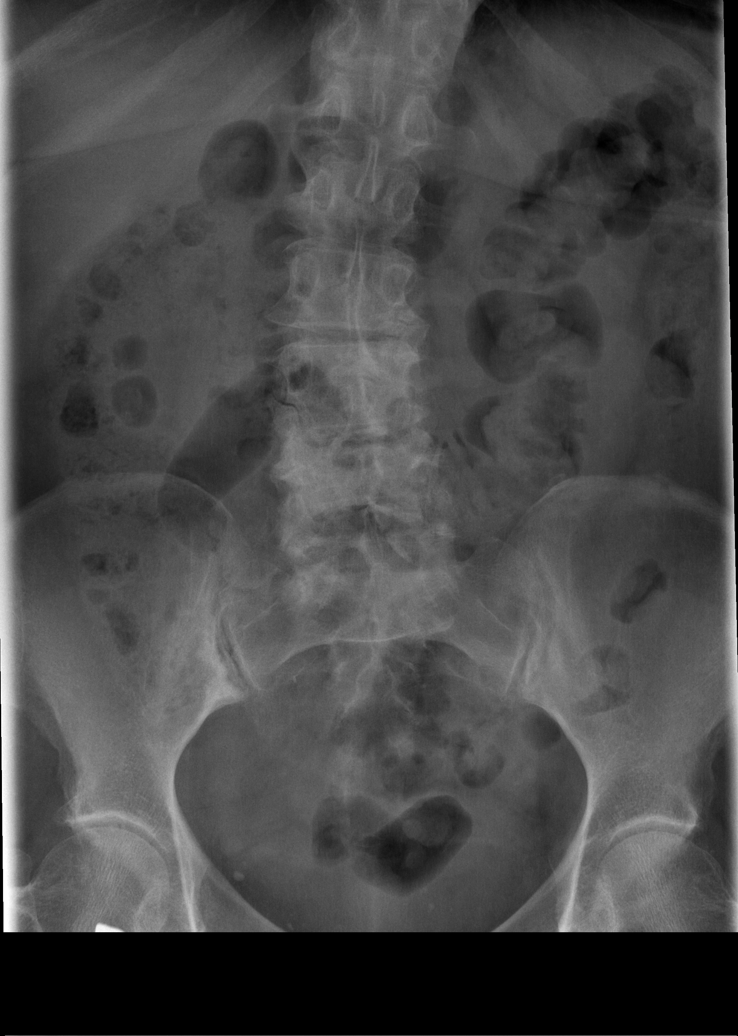

[t l-spine oblique exposure (2 of 3)]
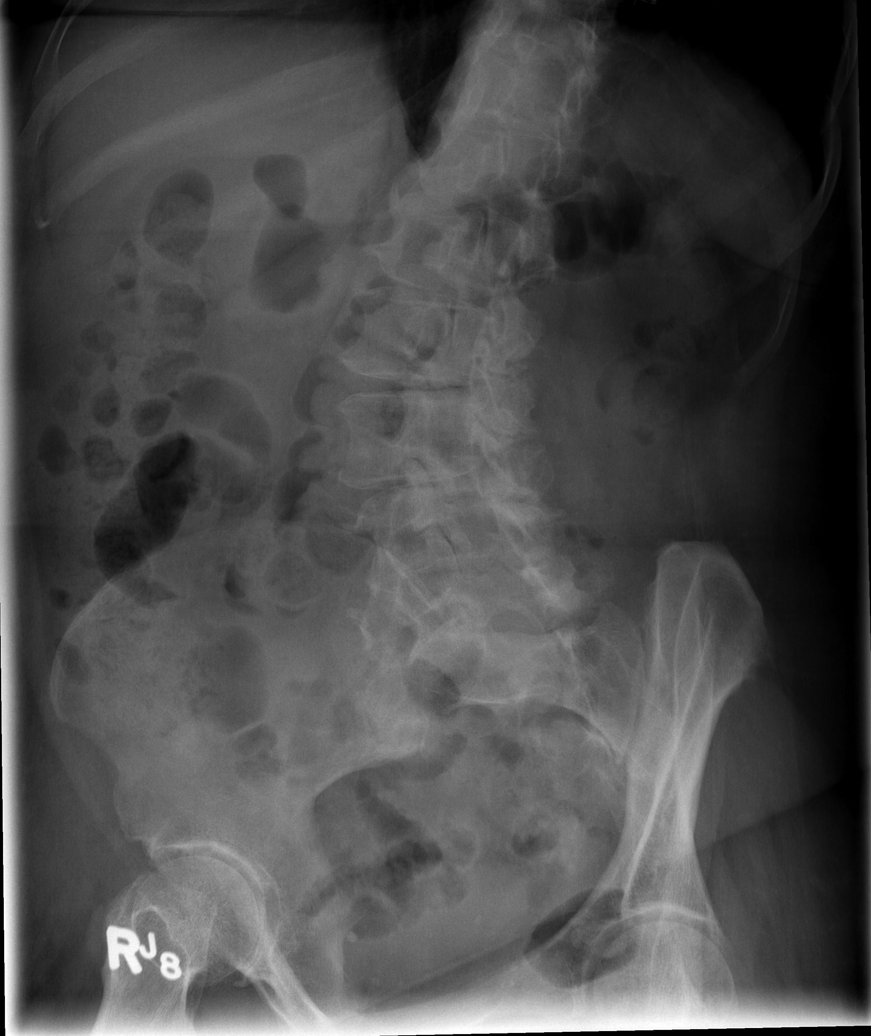

[t l-spine oblique exposure (3 of 3)]
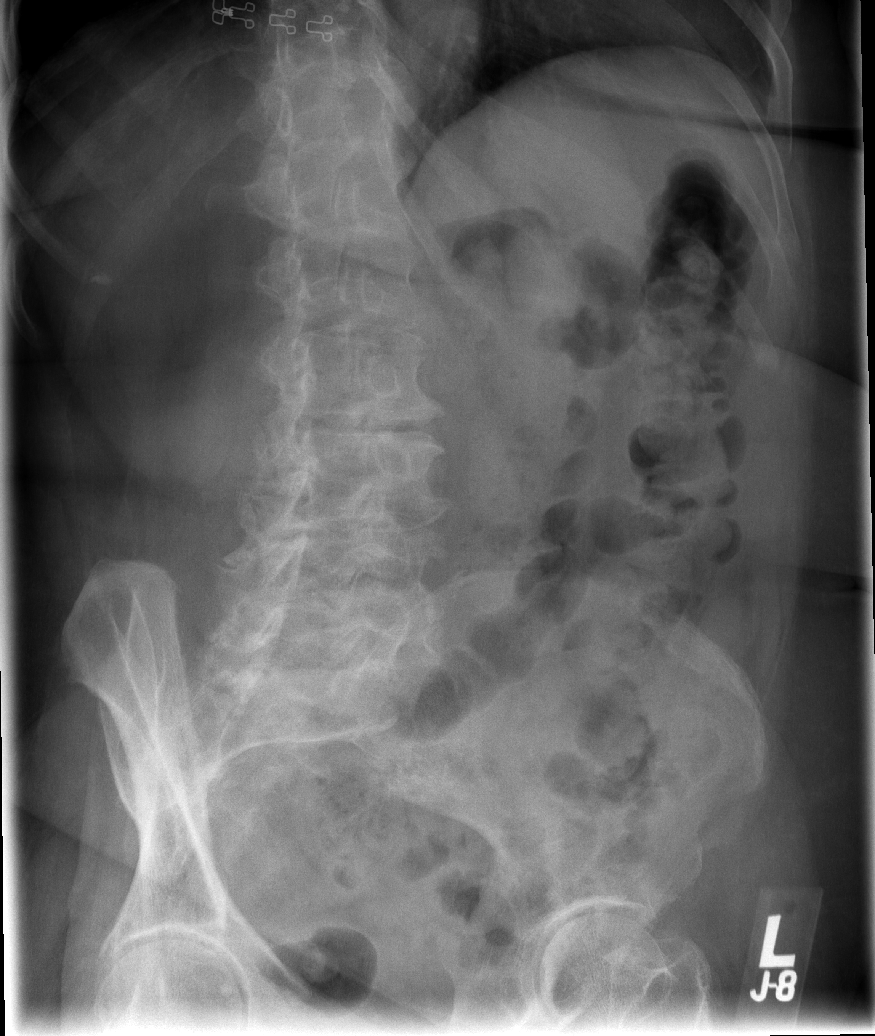

[t l-spine lat]
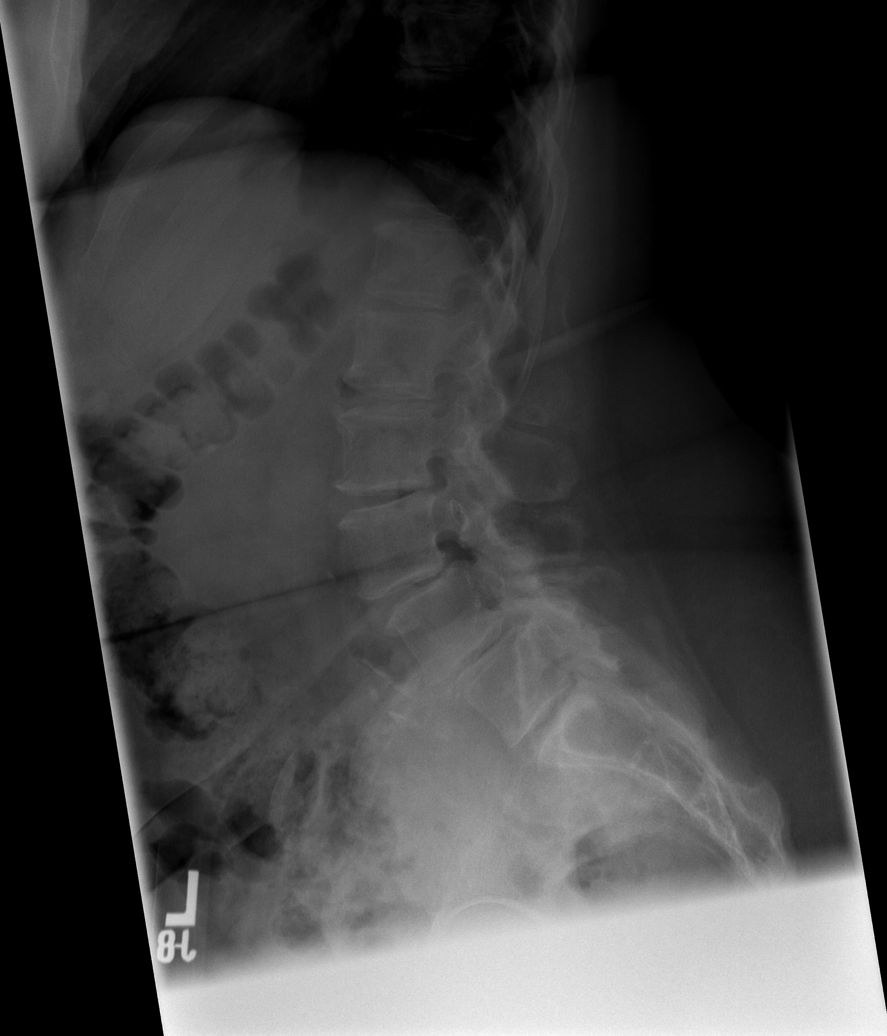

[t l-spine l5-s1 spot]
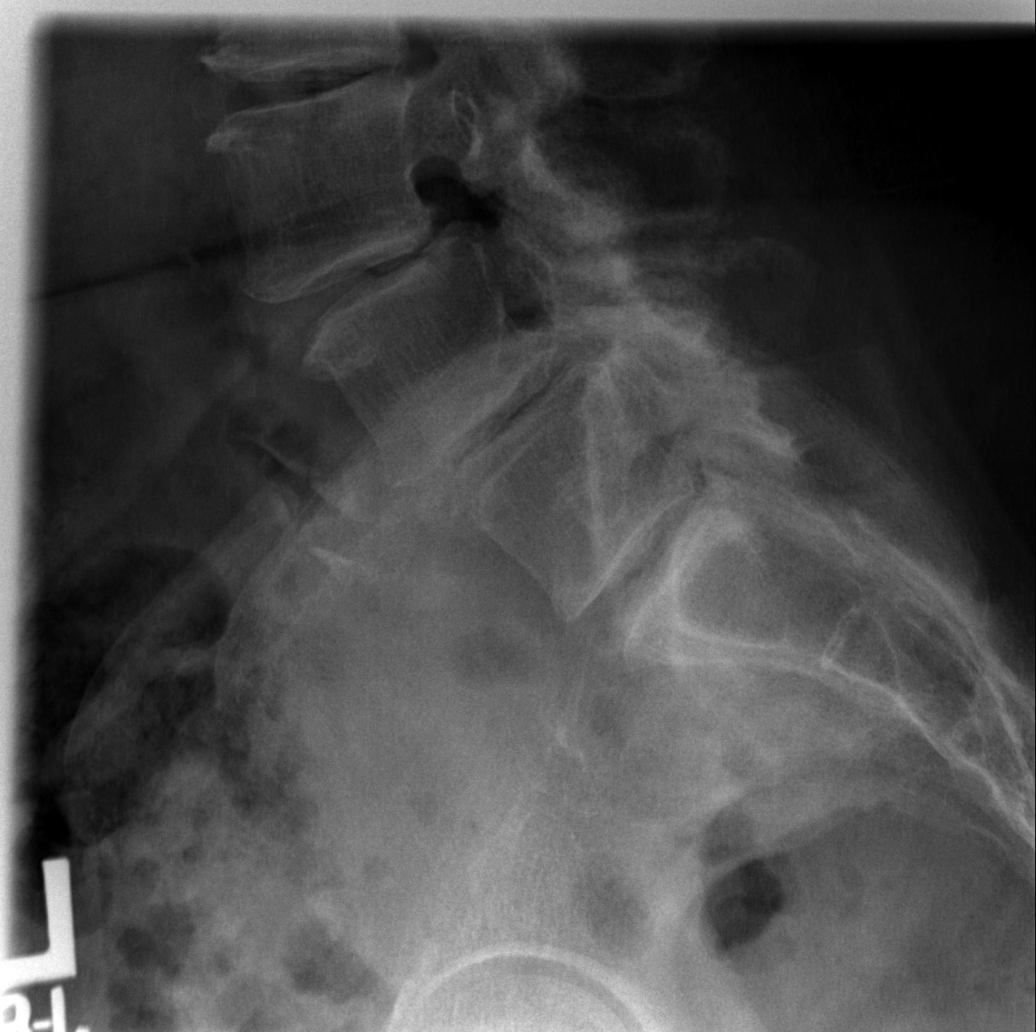

[5 of 5 positions shown; findings below may reference images not displayed]

FINDINGS: Frontal, lateral, spot lumbosacral lateral, and bilateral oblique
views were obtained. There are 6 non-rib-bearing lumbar type
vertebral bodies. There is dextroscoliosis with a rotatory
component. There is no fracture. There is 5 mm of anterolisthesis of
L5 on L6. There is 5 mm of anterolisthesis of L4 on L5. No other
spondylolisthesis is appreciable. There is moderate disc narrowing
at all levels in the lumbar region. There is facet osteoarthritic
change at all levels bilaterally. No blastic or lytic bone lesions
are identified.
IMPRESSION: Scoliosis and multifocal arthropathy. Mild spondylolisthesis at L4-5
and L5-6 are felt to be due to underlying spondylosis. No fracture
apparent.

## 2015-08-24 ENCOUNTER — Encounter: Payer: Self-pay | Admitting: Adult Health

## 2015-08-24 NOTE — Progress Notes (Signed)
A birthday card was mailed to the patient today on behalf of the Survivorship Program at [facility].   Gretchen Dawson, NP Survivorship Program [facility] [phone]  

## 2015-10-09 ENCOUNTER — Ambulatory Visit: Payer: BC Managed Care – PPO | Admitting: Hematology and Oncology

## 2015-10-11 ENCOUNTER — Telehealth: Payer: Self-pay

## 2015-10-11 NOTE — Telephone Encounter (Signed)
I returned call to patient to reschedule missed appt.  Appt made for 5/2.  Pt voiced understanding.

## 2015-10-24 ENCOUNTER — Telehealth: Payer: Self-pay | Admitting: Hematology and Oncology

## 2015-10-24 ENCOUNTER — Encounter: Payer: Self-pay | Admitting: Hematology and Oncology

## 2015-10-24 ENCOUNTER — Ambulatory Visit (HOSPITAL_BASED_OUTPATIENT_CLINIC_OR_DEPARTMENT_OTHER): Payer: BC Managed Care – PPO | Admitting: Hematology and Oncology

## 2015-10-24 VITALS — BP 138/71 | HR 70 | Temp 97.9°F | Resp 18 | Ht 60.0 in | Wt 206.0 lb

## 2015-10-24 DIAGNOSIS — Z170 Estrogen receptor positive status [ER+]: Secondary | ICD-10-CM

## 2015-10-24 DIAGNOSIS — Z79811 Long term (current) use of aromatase inhibitors: Secondary | ICD-10-CM | POA: Diagnosis not present

## 2015-10-24 DIAGNOSIS — C50511 Malignant neoplasm of lower-outer quadrant of right female breast: Secondary | ICD-10-CM

## 2015-10-24 MED ORDER — HYDROCHLOROTHIAZIDE 12.5 MG PO TABS: 12.5000 mg | ORAL_TABLET | Freq: Every day | ORAL | Status: DC

## 2015-10-24 NOTE — Assessment & Plan Note (Addendum)
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 29%, HER-2 negative ratio 1, Ki-67 21%, T1 cN0 M0 stage IA Oncotype Dx 17 (Low Risk) S/P XRT  Current treatment: Antiestrogen therapy once daily for 5 years started 12/23/2014   Anastrozole toxicities: 1. Occasional hot flashes 2. Muscle aches and pains especially left lower extremity this started after she fell at work. Received physical therapy.  Breast cancer surveillance:  1. Breast exam 10/24/2015: No palpable nodules or concerns 2. Mammogram 08/29/2015: Postsurgical changes breast density category B 3. Bone density  01/10/2015: T score -1.3 mild osteopenia: Continue with calcium and vitamin D   Survivorship: I encouraged her to join the [employer] live strong program as well as [facility] program. Return to clinic in 6 months for follow-up.

## 2015-10-24 NOTE — Telephone Encounter (Signed)
appt made and avs printed °

## 2015-10-24 NOTE — Progress Notes (Signed)
Patient Care Team: Glendale Chard, MD as PCP - General (Internal Medicine) Autumn Messing III, MD as Consulting Physician (General Surgery) Nicholas Lose, MD as Consulting Physician (Hematology and Oncology) Thea Silversmith, MD as Consulting Physician (Radiation Oncology) Rockwell Germany, RN as Registered Nurse Mauro Kaufmann, RN as Registered Nurse Holley Bouche, NP as Nurse Practitioner (Nurse Practitioner) Sylvan Cheese, NP as Nurse Practitioner (Hematology and Oncology)  DIAGNOSIS: Breast cancer of lower-outer quadrant of right female breast [facility])   Staging form: Breast, AJCC 7th Edition     Clinical stage from 09/07/2014: Stage IA (T1c, N0, M0) - Unsigned       Staging comments: Staged at breast conference on 3.16.16      Pathologic stage from 10/03/2014: Stage IA (T1c, N0, cM0) - Signed by Enid Cutter, MD on 10/06/2014       Staging comments: Staged on final lumpectomy specimen by Dr. Saralyn Pilar    SUMMARY OF ONCOLOGIC HISTORY:   Breast cancer of lower-outer quadrant of right female breast ([place])   08/31/2014 Mammogram Right breast: irregular mass with spiculated margin and heterogenous calcifications central to the nipple, middle depth   08/31/2014 Breast US Right breast: 1.5 cm irregular mass, spiculared margin, right breast LOQ, middle depth, hypoechoic with posterior acoustic shadowing   09/01/2014 Initial Biopsy Right breast core needle bx: invasive ductal carcinoma with DCIS, grade 2, ER+ (100%), PR+ (29%), HER-2 negative (ratio 1.00), Ki-67 21%,    09/08/2014 Breast MRI Right breast: There is a spiculated enhancing mass with washout kinetics in the lateral right breast at the level of the nipple mid to posterior third depth measuring 1.7 x 1.8 x 1.7 cm with associated clip   09/08/2014 Clinical Stage Stage IA: T1c N0   09/30/2014 Definitive Surgery Right lumpectomy/[facility] Marlou Starks): Invasive ductal carcinoma 1.8 cm with DCIS ER+ (100%), PR+ (29%), HER-2 repeated and remains negative  (ratio 1.00), Ki-67 21%. 1 N removed and negative for malignancy (0/1).   09/30/2014 Pathologic Stage Stage IA: pT1c pN0    09/30/2014 Oncotype testing Score: 17 (11% ROR). No chemotherapy.   11/10/2014 - 12/09/2014 Radiation Therapy Adjuvant RT Pablo Ledger): Right breast/ 42.72 Gy over 21 fractions. Right breast boost/ 10 Gy over 5 fractions   12/23/2014 -  Anti-estrogen oral therapy Anastrozole 1 mg daily. Planned duration of therapy 5-10 years.   05/09/2015 Survivorship Survivorship visit completed and copy of care plan provided to patient.    CHIEF COMPLIANT: Follow-up anastrozole  INTERVAL HISTORY: Cynthia Barton is a 70 year old with above-mentioned history of right breast cancer currently anastrozole therapy. She complains of muscle stiffness and achiness. She had fallen before and continues to have pain in her hip and her left knee. She is unable to offer any great distances. She would like to have a handicap plate cracked today.  REVIEW OF SYSTEMS:   Constitutional: Denies fevers, chills or abnormal weight loss Eyes: Denies blurriness of vision Ears, nose, mouth, throat, and face: Denies mucositis or sore throat Respiratory: Denies cough, dyspnea or wheezes Cardiovascular: Denies palpitation, chest discomfort Gastrointestinal:  Denies nausea, heartburn or change in bowel habits Skin: Denies abnormal skin rashes Lymphatics: Denies new lymphadenopathy or easy bruising Neurological:Denies numbness, tingling or new weaknesses Behavioral/Psych: Mood is stable, no new changes  Extremities: No lower extremity edema Breast:  denies any pain or lumps or nodules in either breasts All other systems were reviewed with the patient and are negative.  I have reviewed the past medical history, past surgical history, social history  and family history with the patient and they are unchanged from previous note.  ALLERGIES:  has No Known Allergies.  MEDICATIONS:  Current Outpatient Prescriptions    Medication Sig Dispense Refill  . anastrozole (ARIMIDEX) 1 MG tablet Take 1 tablet (1 mg total) by mouth daily. 90 tablet 3  . lisinopril (PRINIVIL,ZESTRIL) 10 MG tablet Take 10 mg by mouth daily.    . Multiple Vitamins-Minerals (MULTIVITAMIN WITH MINERALS) tablet Take 1 tablet by mouth daily.    . Pitavastatin Calcium 4 MG TABS Take by mouth daily.     No current facility-administered medications for this visit.    PHYSICAL EXAMINATION: ECOG PERFORMANCE STATUS: 1 - Symptomatic but completely ambulatory  Filed Vitals:   10/24/15 0919  BP: 138/71  Pulse: 70  Temp: 97.9 F (36.6 C)  Resp: 18   Filed Weights   10/24/15 0919  Weight: 206 lb (93.441 kg)    GENERAL:alert, no distress and comfortable SKIN: skin color, texture, turgor are normal, no rashes or significant lesions EYES: normal, Conjunctiva are pink and non-injected, sclera clear OROPHARYNX:no exudate, no erythema and lips, buccal mucosa, and tongue normal  NECK: supple, thyroid normal size, non-tender, without nodularity LYMPH:  no palpable lymphadenopathy in the cervical, axillary or inguinal LUNGS: clear to auscultation and percussion with normal breathing effort HEART: regular rate & rhythm and no murmurs and no lower extremity edema ABDOMEN:abdomen soft, non-tender and normal bowel sounds MUSCULOSKELETAL:no cyanosis of digits and no clubbing  NEURO: alert & oriented x 3 with fluent speech, no focal motor/sensory deficits EXTREMITIES: No lower extremity edema BREAST: No palpable masses or nodules in either right or left breasts. No palpable axillary supraclavicular or infraclavicular adenopathy no breast tenderness or nipple discharge. (exam performed in the presence of a chaperone)  LABORATORY DATA:  I have reviewed the data as listed   Chemistry      Component Value Date/Time   NA 140 12/22/2014 1344   K 3.9 12/22/2014 1344   CO2 25 12/22/2014 1344   BUN 9.6 12/22/2014 1344   CREATININE 0.8 12/22/2014  1344      Component Value Date/Time   CALCIUM 10.1 12/22/2014 1344   ALKPHOS 56 12/22/2014 1344   AST 25 12/22/2014 1344   ALT 21 12/22/2014 1344   BILITOT 0.75 12/22/2014 1344       Lab Results  Component Value Date   WBC 7.8 12/22/2014   HGB 13.2 12/22/2014   HCT 41.1 12/22/2014   MCV 86.5 12/22/2014   PLT 158 12/22/2014   NEUTROABS 6.2 12/22/2014   ASSESSMENT & PLAN:  Breast cancer of lower-outer quadrant of right female breast Right lumpectomy 09/30/2014: 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 29%, HER-2 negative ratio 1, Ki-67 21%, T1 cN0 M0 stage IA Oncotype Dx 17 (Low Risk) S/P XRT  Current treatment: Antiestrogen therapy once daily for 5 years started 12/23/2014   Anastrozole toxicities: 1. Occasional hot flashes 2. Muscle aches and pains especially left lower extremity this started after she fell at work. Received physical therapy.  Breast cancer surveillance:  1. Breast exam 10/24/2015: No palpable nodules or concerns 2. Mammogram 08/29/2015: Postsurgical changes breast density category B 3. Bone density  01/10/2015: T score -1.3 mild osteopenia: Continue with calcium and vitamin D   Survivorship: I encouraged her to join the [employer] live strong program as well as [facility] program. I also instructed her to join yoga. Return to clinic in 6 months for follow-up.    No orders of  the defined types were placed in this encounter.   The patient has a good understanding of the overall plan. she agrees with it. she will call with any problems that may develop before the next visit here.   Rulon Eisenmenger, MD 10/24/2015

## 2016-02-02 ENCOUNTER — Other Ambulatory Visit: Payer: Self-pay | Admitting: Hematology and Oncology

## 2016-02-02 DIAGNOSIS — C50511 Malignant neoplasm of lower-outer quadrant of right female breast: Secondary | ICD-10-CM

## 2016-04-23 NOTE — Assessment & Plan Note (Signed)
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 29%, HER-2 negative ratio 1, Ki-67 21%, T1 cN0 M0 stage IA Oncotype Dx 17 (Low Risk) S/P XRT  Current treatment: Antiestrogen therapy once daily for 5 years started 12/23/2014   Anastrozole toxicities: 1. Occasional hot flashes 2. Muscle aches and pains especially left lower extremity this started after she fell at work. Received physical therapy.  Breast cancer surveillance:  1. Breast exam 04/23/2016: No palpable nodules or concerns 2. Mammogram 08/29/2015: Postsurgical changes breast density category B 3. Bone density  01/10/2015: T score -1.3 mild osteopenia: Continue with calcium and vitamin D   Survivorship: I encouraged her to join the [employer] live strong program as well as [facility] program. I also instructed her to join yoga. Return to clinic in 6 months for follow-up.

## 2016-04-24 ENCOUNTER — Ambulatory Visit (HOSPITAL_BASED_OUTPATIENT_CLINIC_OR_DEPARTMENT_OTHER): Payer: BC Managed Care – PPO | Admitting: Hematology and Oncology

## 2016-04-24 ENCOUNTER — Encounter: Payer: Self-pay | Admitting: Hematology and Oncology

## 2016-04-24 DIAGNOSIS — Z170 Estrogen receptor positive status [ER+]: Secondary | ICD-10-CM | POA: Diagnosis not present

## 2016-04-24 DIAGNOSIS — C50511 Malignant neoplasm of lower-outer quadrant of right female breast: Secondary | ICD-10-CM | POA: Diagnosis not present

## 2016-04-24 DIAGNOSIS — Z79811 Long term (current) use of aromatase inhibitors: Secondary | ICD-10-CM | POA: Diagnosis not present

## 2016-04-24 DIAGNOSIS — M8580 Other specified disorders of bone density and structure, unspecified site: Secondary | ICD-10-CM | POA: Diagnosis not present

## 2016-04-24 NOTE — Progress Notes (Signed)
Patient Care Team: Glendale Chard, MD as PCP - General (Internal Medicine) Autumn Messing III, MD as Consulting Physician (General Surgery) Nicholas Lose, MD as Consulting Physician (Hematology and Oncology) Thea Silversmith, MD as Consulting Physician (Radiation Oncology) Rockwell Germany, RN as Registered Nurse Mauro Kaufmann, RN as Registered Nurse Holley Bouche, NP as Nurse Practitioner (Nurse Practitioner) Sylvan Cheese, NP as Nurse Practitioner (Hematology and Oncology)  DIAGNOSIS:  Encounter Diagnosis  Name Primary?  . Malignant neoplasm of lower-outer quadrant of right breast of female, estrogen receptor positive ([place])     SUMMARY OF ONCOLOGIC HISTORY:   Breast cancer of lower-outer quadrant of right female breast ([place])   08/31/2014 Mammogram    Right breast: irregular mass with spiculated margin and heterogenous calcifications central to the nipple, middle depth      08/31/2014 Breast US    Right breast: 1.5 cm irregular mass, spiculared margin, right breast LOQ, middle depth, hypoechoic with posterior acoustic shadowing      09/01/2014 Initial Biopsy    Right breast core needle bx: invasive ductal carcinoma with DCIS, grade 2, ER+ (100%), PR+ (29%), HER-2 negative (ratio 1.00), Ki-67 21%,       09/08/2014 Breast MRI    Right breast: There is a spiculated enhancing mass with washout kinetics in the lateral right breast at the level of the nipple mid to posterior third depth measuring 1.7 x 1.8 x 1.7 cm with associated clip      09/08/2014 Clinical Stage    Stage IA: T1c N0      09/30/2014 Definitive Surgery    Right lumpectomy/[facility] Marlou Starks): Invasive ductal carcinoma 1.8 cm with DCIS ER+ (100%), PR+ (29%), HER-2 repeated and remains negative (ratio 1.00), Ki-67 21%. 1 N removed and negative for malignancy (0/1).      09/30/2014 Pathologic Stage    Stage IA: pT1c pN0       09/30/2014 Oncotype testing    Score: 17 (11% ROR). No chemotherapy.      11/10/2014 -  12/09/2014 Radiation Therapy    Adjuvant RT Pablo Ledger): Right breast/ 42.72 Gy over 21 fractions. Right breast boost/ 10 Gy over 5 fractions      12/23/2014 -  Anti-estrogen oral therapy    Anastrozole 1 mg daily. Planned duration of therapy 5-10 years.      05/09/2015 Survivorship    Survivorship visit completed and copy of care plan provided to patient.       CHIEF COMPLIANT: Follow-up on anastrozole therapy  INTERVAL HISTORY: Cynthia Barton is a 70 year old with above-mentioned history of right breast cancer treated with lumpectomy and adjuvant radiation is currently on anastrozole therapy. She is tolerating anastrozole extremely well. She has occasional hot flashes. She complains of stiffness in the right hand especially when she wakes up in the morning it is associated with mild to moderate pain it gets better when she exercises. She denies any lumps or nodules in the breast. She complains of mild tenderness in the right breast.  REVIEW OF SYSTEMS:   Constitutional: Denies fevers, chills or abnormal weight loss Eyes: Denies blurriness of vision Ears, nose, mouth, throat, and face: Denies mucositis or sore throat Respiratory: Denies cough, dyspnea or wheezes Cardiovascular: Denies palpitation, chest discomfort Gastrointestinal:  Denies nausea, heartburn or change in bowel habits Skin: Denies abnormal skin rashes Lymphatics: Denies new lymphadenopathy or easy bruising Neurological:Denies numbness, tingling or new weaknesses Behavioral/Psych: Mood is stable, no new changes  Extremities: Right finger stiffness Breast:  Right breast tenderness  All other systems were reviewed with the patient and are negative.  I have reviewed the past medical history, past surgical history, social history and family history with the patient and they are unchanged from previous note.  ALLERGIES:  has No Known Allergies.  MEDICATIONS:  Current Outpatient Prescriptions  Medication Sig Dispense Refill   . anastrozole (ARIMIDEX) 1 MG tablet TAKE ONE TABLET BY MOUTH ONCE DAILY 90 tablet 3  . hydrochlorothiazide (HYDRODIURIL) 12.5 MG tablet Take 1 tablet (12.5 mg total) by mouth daily.    Marland Kitchen lisinopril (PRINIVIL,ZESTRIL) 10 MG tablet Take 10 mg by mouth daily.    . Multiple Vitamins-Minerals (MULTIVITAMIN WITH MINERALS) tablet Take 1 tablet by mouth daily.    . Pitavastatin Calcium 4 MG TABS Take by mouth daily.     No current facility-administered medications for this visit.     PHYSICAL EXAMINATION: ECOG PERFORMANCE STATUS: 0 - Asymptomatic  Vitals:   04/24/16 0933  BP: 132/86  Pulse: 81  Resp: 19  Temp: 97.8 F (36.6 C)   Filed Weights   04/24/16 0933  Weight: 210 lb 12.8 oz (95.6 kg)    GENERAL:alert, no distress and comfortable SKIN: skin color, texture, turgor are normal, no rashes or significant lesions EYES: normal, Conjunctiva are pink and non-injected, sclera clear OROPHARYNX:no exudate, no erythema and lips, buccal mucosa, and tongue normal  NECK: supple, thyroid normal size, non-tender, without nodularity LYMPH:  no palpable lymphadenopathy in the cervical, axillary or inguinal LUNGS: clear to auscultation and percussion with normal breathing effort HEART: regular rate & rhythm and no murmurs and no lower extremity edema ABDOMEN:abdomen soft, non-tender and normal bowel sounds MUSCULOSKELETAL:no cyanosis of digits and no clubbing  NEURO: alert & oriented x 3 with fluent speech, no focal motor/sensory deficits EXTREMITIES: No lower extremity edema BREAST: No palpable masses or nodules in either right or left breasts. No palpable axillary supraclavicular or infraclavicular adenopathy no breast tenderness or nipple discharge. (exam performed in the presence of a chaperone)  LABORATORY DATA:  I have reviewed the data as listed   Chemistry      Component Value Date/Time   NA 140 12/22/2014 1344   K 3.9 12/22/2014 1344   CO2 25 12/22/2014 1344   BUN 9.6 12/22/2014  1344   CREATININE 0.8 12/22/2014 1344      Component Value Date/Time   CALCIUM 10.1 12/22/2014 1344   ALKPHOS 56 12/22/2014 1344   AST 25 12/22/2014 1344   ALT 21 12/22/2014 1344   BILITOT 0.75 12/22/2014 1344       Lab Results  Component Value Date   WBC 7.8 12/22/2014   HGB 13.2 12/22/2014   HCT 41.1 12/22/2014   MCV 86.5 12/22/2014   PLT 158 12/22/2014   NEUTROABS 6.2 12/22/2014     ASSESSMENT & PLAN:  Breast cancer of lower-outer quadrant of right female breast Right lumpectomy 09/30/2014: 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 29%, HER-2 negative ratio 1, Ki-67 21%, T1 cN0 M0 stage IA Oncotype Dx 17 (Low Risk) S/P XRT  Current treatment: Antiestrogen therapy once daily for 5 years started 12/23/2014   Anastrozole toxicities: 1. Occasional hot flashes 2. Muscle aches and pains especially left lower extremity this started after she fell at work. Received physical therapy.  Breast cancer surveillance:  1. Breast exam 04/23/2016: No palpable nodules or concerns 2. Mammogram 08/29/2015: Postsurgical changes breast density category B 3. Bone density  01/10/2015: T score -1.3 mild osteopenia: Continue with calcium and vitamin  D   Survivorship: I encouraged her to join the [employer] live strong program as well as [facility] program. I also instructed her to join yoga. Return to clinic in 6 months for follow-up.  No orders of the defined types were placed in this encounter.  The patient has a good understanding of the overall plan. she agrees with it. she will call with any problems that may develop before the next visit here.   Rulon Eisenmenger, MD 04/24/16

## 2016-10-22 ENCOUNTER — Encounter: Payer: Self-pay | Admitting: Hematology and Oncology

## 2016-10-22 ENCOUNTER — Ambulatory Visit (HOSPITAL_BASED_OUTPATIENT_CLINIC_OR_DEPARTMENT_OTHER): Payer: BC Managed Care – PPO | Admitting: Hematology and Oncology

## 2016-10-22 DIAGNOSIS — C50511 Malignant neoplasm of lower-outer quadrant of right female breast: Secondary | ICD-10-CM | POA: Diagnosis not present

## 2016-10-22 DIAGNOSIS — Z170 Estrogen receptor positive status [ER+]: Secondary | ICD-10-CM | POA: Diagnosis not present

## 2016-10-22 DIAGNOSIS — Z79811 Long term (current) use of aromatase inhibitors: Secondary | ICD-10-CM

## 2016-10-22 MED ORDER — FEXOFENADINE HCL 180 MG PO TABS: 180.0000 mg | ORAL_TABLET | Freq: Every day | ORAL | Status: DC

## 2016-10-22 MED ORDER — ANASTROZOLE 1 MG PO TABS: 1.0000 mg | ORAL_TABLET | Freq: Every day | ORAL | 3 refills | Status: DC

## 2016-10-22 NOTE — Progress Notes (Signed)
Patient Care Team: Glendale Chard, MD as PCP - General (Internal Medicine) Autumn Messing III, MD as Consulting Physician (General Surgery) Nicholas Lose, MD as Consulting Physician (Hematology and Oncology) Thea Silversmith, MD (Inactive) as Consulting Physician (Radiation Oncology) Rockwell Germany, RN as Registered Nurse Mauro Kaufmann, RN as Registered Nurse Holley Bouche, NP as Nurse Practitioner (Nurse Practitioner) Sylvan Cheese, NP as Nurse Practitioner (Hematology and Oncology)  DIAGNOSIS:  Encounter Diagnosis  Name Primary?  . Malignant neoplasm of lower-outer quadrant of right breast of female, estrogen receptor positive ([place])     SUMMARY OF ONCOLOGIC HISTORY:   Breast cancer of lower-outer quadrant of right female breast ([place])   08/31/2014 Mammogram    Right breast: irregular mass with spiculated margin and heterogenous calcifications central to the nipple, middle depth      08/31/2014 Breast US    Right breast: 1.5 cm irregular mass, spiculared margin, right breast LOQ, middle depth, hypoechoic with posterior acoustic shadowing      09/01/2014 Initial Biopsy    Right breast core needle bx: invasive ductal carcinoma with DCIS, grade 2, ER+ (100%), PR+ (29%), HER-2 negative (ratio 1.00), Ki-67 21%,       09/08/2014 Breast MRI    Right breast: There is a spiculated enhancing mass with washout kinetics in the lateral right breast at the level of the nipple mid to posterior third depth measuring 1.7 x 1.8 x 1.7 cm with associated clip      09/08/2014 Clinical Stage    Stage IA: T1c N0      09/30/2014 Definitive Surgery    Right lumpectomy/[facility] Marlou Starks): Invasive ductal carcinoma 1.8 cm with DCIS ER+ (100%), PR+ (29%), HER-2 repeated and remains negative (ratio 1.00), Ki-67 21%. 1 N removed and negative for malignancy (0/1).      09/30/2014 Pathologic Stage    Stage IA: pT1c pN0       09/30/2014 Oncotype testing    Score: 17 (11% ROR). No chemotherapy.      11/10/2014 - 12/09/2014 Radiation Therapy    Adjuvant RT Pablo Ledger): Right breast/ 42.72 Gy over 21 fractions. Right breast boost/ 10 Gy over 5 fractions      12/23/2014 -  Anti-estrogen oral therapy    Anastrozole 1 mg daily. Planned duration of therapy 5-10 years.      05/09/2015 Survivorship    Survivorship visit completed and copy of care plan provided to patient.       CHIEF COMPLIANT: Follow-up on anastrozole therapy  INTERVAL HISTORY: Cynthia Barton is a 71 year old with above-mentioned history of right breast cancer who underwent lumpectomy followed by adjuvant radiation is currently on anastrozole. She is tolerating anastrozole extremely well. She does have intermittent jabs of pain and discomfort in the right breast. She also noticed recently some swelling of the right fingers accompanied by tingling and numbness that only lasted for a day. The symptoms went away on their own. She has not had any further problems since then. She denies any lumps or nodules in the breasts.  REVIEW OF SYSTEMS:   Constitutional: Denies fevers, chills or abnormal weight loss Eyes: Denies blurriness of vision Ears, nose, mouth, throat, and face: Denies mucositis or sore throat Respiratory: Denies cough, dyspnea or wheezes Cardiovascular: Denies palpitation, chest discomfort Gastrointestinal:  Denies nausea, heartburn or change in bowel habits Skin: Denies abnormal skin rashes Lymphatics: Denies new lymphadenopathy or easy bruising Neurological:Denies numbness, tingling or new weaknesses Behavioral/Psych: Mood is stable, no new changes  Extremities: No lower  extremity edema Breast:  denies any pain or lumps or nodules in either breasts All other systems were reviewed with the patient and are negative.  I have reviewed the past medical history, past surgical history, social history and family history with the patient and they are unchanged from previous note.  ALLERGIES:  has No Known  Allergies.  MEDICATIONS:  Current Outpatient Prescriptions  Medication Sig Dispense Refill  . anastrozole (ARIMIDEX) 1 MG tablet TAKE ONE TABLET BY MOUTH ONCE DAILY 90 tablet 3  . hydrochlorothiazide (HYDRODIURIL) 12.5 MG tablet Take 1 tablet (12.5 mg total) by mouth daily.    Marland Kitchen lisinopril (PRINIVIL,ZESTRIL) 10 MG tablet Take 10 mg by mouth daily.    . Multiple Vitamins-Minerals (MULTIVITAMIN WITH MINERALS) tablet Take 1 tablet by mouth daily.    . Pitavastatin Calcium 4 MG TABS Take by mouth daily.     No current facility-administered medications for this visit.     PHYSICAL EXAMINATION: ECOG PERFORMANCE STATUS: 1 - Symptomatic but completely ambulatory  Vitals:   10/22/16 1007  BP: 138/68  Pulse: 74  Resp: 18  Temp: 98 F (36.7 C)   Filed Weights   10/22/16 1007  Weight: 206 lb 3.2 oz (93.5 kg)    GENERAL:alert, no distress and comfortable SKIN: skin color, texture, turgor are normal, no rashes or significant lesions EYES: normal, Conjunctiva are pink and non-injected, sclera clear OROPHARYNX:no exudate, no erythema and lips, buccal mucosa, and tongue normal  NECK: supple, thyroid normal size, non-tender, without nodularity LYMPH:  no palpable lymphadenopathy in the cervical, axillary or inguinal LUNGS: clear to auscultation and percussion with normal breathing effort HEART: regular rate & rhythm and no murmurs and no lower extremity edema ABDOMEN:abdomen soft, non-tender and normal bowel sounds MUSCULOSKELETAL:no cyanosis of digits and no clubbing  NEURO: alert & oriented x 3 with fluent speech, no focal motor/sensory deficits EXTREMITIES: No lower extremity edema BREAST: No palpable masses or nodules in either right or left breasts. No palpable axillary supraclavicular or infraclavicular adenopathy no breast tenderness or nipple discharge. (exam performed in the presence of a chaperone)  LABORATORY DATA:  I have reviewed the data as listed   Chemistry       Component Value Date/Time   NA 140 12/22/2014 1344   K 3.9 12/22/2014 1344   CO2 25 12/22/2014 1344   BUN 9.6 12/22/2014 1344   CREATININE 0.8 12/22/2014 1344      Component Value Date/Time   CALCIUM 10.1 12/22/2014 1344   ALKPHOS 56 12/22/2014 1344   AST 25 12/22/2014 1344   ALT 21 12/22/2014 1344   BILITOT 0.75 12/22/2014 1344       Lab Results  Component Value Date   WBC 7.8 12/22/2014   HGB 13.2 12/22/2014   HCT 41.1 12/22/2014   MCV 86.5 12/22/2014   PLT 158 12/22/2014   NEUTROABS 6.2 12/22/2014    ASSESSMENT & PLAN:  Breast cancer of lower-outer quadrant of right female breast Right lumpectomy 09/30/2014: 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 29%, HER-2 negative ratio 1, Ki-67 21%, T1 cN0 M0 stage IA Oncotype Dx 17 (Low Risk) S/P XRT  Current treatment: Antiestrogen therapy once daily for 5 years started 12/23/2014   Anastrozole toxicities: 1. Occasional hot flashes 2. Muscle aches and pains especially left lower extremity this started after she fell at work. Received physical therapy.  Intermittent breast pain: No palpable lumps or nodules today. I reassured her that based on the recent mammogram findings in my  physical examination there is no concern for breast cancer recurrence.  Breast cancer surveillance:  1. Breast exam 10/22/2016: No palpable nodules or concerns 2. Mammogram 08/29/2016 at [facility]: Postsurgical changes breast density category B 3. Bone density 01/10/2015: T score -1.3 mild osteopenia: Continue with calcium and vitamin D   Return to clinic in 1 year for follow-up.  I spent 25 minutes talking to the patient of which more than half was spent in counseling and coordination of care.  No orders of the defined types were placed in this encounter.  The patient has a good understanding of the overall plan. she agrees with it. she will call with any problems that may develop before the next visit here.   Rulon Eisenmenger,  MD 10/22/16

## 2016-10-22 NOTE — Assessment & Plan Note (Signed)
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 29%, HER-2 negative ratio 1, Ki-67 21%, T1 cN0 M0 stage IA Oncotype Dx 17 (Low Risk) S/P XRT  Current treatment: Antiestrogen therapy once daily for 5 years started 12/23/2014   Anastrozole toxicities: 1. Occasional hot flashes 2. Muscle aches and pains especially left lower extremity this started after she fell at work. Received physical therapy.  Breast cancer surveillance:  1. Breast exam 10/22/2016: No palpable nodules or concerns 2. Mammogram 08/29/2016 at [facility]: Postsurgical changes breast density category B 3. Bone density 01/10/2015: T score -1.3 mild osteopenia: Continue with calcium and vitamin D   Return to clinic in 1 year for follow-up.

## 2016-11-06 ENCOUNTER — Encounter: Payer: Self-pay | Admitting: Gynecology

## 2017-03-27 ENCOUNTER — Encounter: Payer: Self-pay | Admitting: Hematology and Oncology

## 2017-04-02 ENCOUNTER — Emergency Department (HOSPITAL_COMMUNITY): Payer: BC Managed Care – PPO

## 2017-04-02 ENCOUNTER — Emergency Department (HOSPITAL_COMMUNITY)
Admission: EM | Admit: 2017-04-02 | Discharge: 2017-04-02 | Disposition: A | Discharge status: Home / Self Care | Payer: BC Managed Care – PPO | Attending: Emergency Medicine | Admitting: Emergency Medicine

## 2017-04-02 ENCOUNTER — Encounter (HOSPITAL_COMMUNITY): Payer: Self-pay | Admitting: Emergency Medicine

## 2017-04-02 DIAGNOSIS — Z79899 Other long term (current) drug therapy: Secondary | ICD-10-CM | POA: Insufficient documentation

## 2017-04-02 DIAGNOSIS — D0591 Unspecified type of carcinoma in situ of right breast: Secondary | ICD-10-CM | POA: Insufficient documentation

## 2017-04-02 DIAGNOSIS — Z853 Personal history of malignant neoplasm of breast: Secondary | ICD-10-CM | POA: Insufficient documentation

## 2017-04-02 DIAGNOSIS — N390 Urinary tract infection, site not specified: Secondary | ICD-10-CM

## 2017-04-02 DIAGNOSIS — I10 Essential (primary) hypertension: Secondary | ICD-10-CM | POA: Diagnosis not present

## 2017-04-02 DIAGNOSIS — R109 Unspecified abdominal pain: Secondary | ICD-10-CM

## 2017-04-02 DIAGNOSIS — R1031 Right lower quadrant pain: Secondary | ICD-10-CM | POA: Diagnosis present

## 2017-04-02 LAB — URINALYSIS, ROUTINE W REFLEX MICROSCOPIC
BILIRUBIN URINE: NEGATIVE
GLUCOSE, UA: NEGATIVE mg/dL
HGB URINE DIPSTICK: NEGATIVE
KETONES UR: NEGATIVE mg/dL
NITRITE: NEGATIVE
Protein, ur: 30 mg/dL — AB
Specific Gravity, Urine: 1.017 (ref 1.005–1.030)
pH: 5 (ref 5.0–8.0)

## 2017-04-02 LAB — COMPREHENSIVE METABOLIC PANEL
ALT: 21 U/L (ref 14–54)
AST: 26 U/L (ref 15–41)
Albumin: 3.9 g/dL (ref 3.5–5.0)
Alkaline Phosphatase: 57 U/L (ref 38–126)
Anion gap: 8 (ref 5–15)
BUN: 13 mg/dL (ref 6–20)
CHLORIDE: 102 mmol/L (ref 101–111)
CO2: 27 mmol/L (ref 22–32)
CREATININE: 0.86 mg/dL (ref 0.44–1.00)
Calcium: 10.5 mg/dL — ABNORMAL HIGH (ref 8.9–10.3)
GFR calc non Af Amer: >60 mL/min (ref >60)
Glucose, Bld: 115 mg/dL — ABNORMAL HIGH (ref 65–99)
POTASSIUM: 4 mmol/L (ref 3.5–5.1)
SODIUM: 137 mmol/L (ref 135–145)
Total Bilirubin: 0.8 mg/dL (ref 0.3–1.2)
Total Protein: 7.8 g/dL (ref 6.5–8.1)

## 2017-04-02 LAB — CBC WITH DIFFERENTIAL/PLATELET
BASOS ABS: 0 10*3/uL (ref 0.0–0.1)
Basophils Relative: 0 %
EOS ABS: 0 10*3/uL (ref 0.0–0.7)
EOS PCT: 0 %
HCT: 40.8 % (ref 36.0–46.0)
Hemoglobin: 14 g/dL (ref 12.0–15.0)
LYMPHS ABS: 1.7 10*3/uL (ref 0.7–4.0)
Lymphocytes Relative: 25 %
MCH: 29.2 pg (ref 26.0–34.0)
MCHC: 34.3 g/dL (ref 30.0–36.0)
MCV: 85.2 fL (ref 78.0–100.0)
Monocytes Absolute: 0.4 10*3/uL (ref 0.1–1.0)
Monocytes Relative: 6 %
Neutro Abs: 4.5 10*3/uL (ref 1.7–7.7)
Neutrophils Relative %: 69 %
PLATELETS: 208 10*3/uL (ref 150–400)
RBC: 4.79 MIL/uL (ref 3.87–5.11)
RDW: 13.4 % (ref 11.5–15.5)
WBC: 6.6 10*3/uL (ref 4.0–10.5)

## 2017-04-02 MED ORDER — CEPHALEXIN 500 MG PO CAPS: 500.0000 mg | ORAL_CAPSULE | Freq: Two times a day (BID) | ORAL | 0 refills | Status: DC

## 2017-04-02 MED ORDER — MORPHINE SULFATE (PF) 4 MG/ML IV SOLN
4.0000 mg | Freq: Once | INTRAVENOUS | Status: AC
  Administered 2017-04-02: 4 mg via INTRAVENOUS
  Filled 2017-04-02 (×2): qty 1

## 2017-04-02 MED ORDER — ONDANSETRON HCL 4 MG/2ML IJ SOLN
4.0000 mg | Freq: Once | INTRAMUSCULAR | Status: AC
  Administered 2017-04-02: 4 mg via INTRAVENOUS
  Filled 2017-04-02 (×2): qty 2

## 2017-04-02 NOTE — ED Triage Notes (Signed)
Pt c/o R mid back pain x 4 days, worsened this morning. Pt attempted Tylenol, no relief. Pt states she had one episode of vomiting today. Pt denies hematuria or urine symptoms.

## 2017-04-02 NOTE — ED Provider Notes (Signed)
[facility] DEPT Provider Note   CSN: [id number] Arrival date & time: 04/02/17  1038     History   Chief Complaint Chief Complaint  Patient presents with  . Back Pain    R mid    HPI Cynthia Barton is a 71 y.o. female.  The history is provided by the patient.  Back Pain   This is a new problem. Episode onset: 4 days. The problem occurs constantly. The problem has been rapidly worsening. The pain is associated with no known injury. The pain is present in the thoracic spine (right flank). The pain does not radiate. The pain is at a severity of 9/10. The pain is severe. Exacerbated by: seems to not be affected by eating or movement. Pertinent negatives include no chest pain, no fever, no numbness, no headaches, no abdominal pain, no abdominal swelling, no bowel incontinence, no bladder incontinence, no dysuria, no pelvic pain, no tingling and no weakness. Treatments tried: tylenol. The treatment provided moderate relief. Risk factors include obesity and a history of cancer.    Past Medical History:  Diagnosis Date  . Breast cancer ([place]) 09/07/2014   ER+/PR+/Her2- right breast  . Breast cancer of lower-outer quadrant of right female breast ([place]) 09/06/2014  . Hyperlipidemia   . Hypertension   . Radiation 11/10/14-12/09/14   Right Breast  . Uterine prolapse 2016  . Wears dentures    top  . Wears glasses     Patient Active Problem List   Diagnosis Date Noted  . Hip pain 12/30/2014  . Breast cancer of lower-outer quadrant of right female breast ([place]) 09/06/2014    Past Surgical History:  Procedure Laterality Date  . BREAST SURGERY  1980   rt br bx-no anesth  . COLONOSCOPY  07/27/12   mild diverticulosis  . lumpectomy right breast Right 09/30/14  . TUBAL LIGATION      OB History    Gravida Para Term Preterm AB Living   '2 2       2   ' SAB TAB Ectopic Multiple Live Births                  Obstetric Comments   Menarche age 39 or 60, BC x 5 years, G2, P2, Menopause age in  her ~ age 68, No HRT x 3 years        Home Medications    Prior to Admission medications   Medication Sig Start Date End Date Taking? Authorizing Provider  anastrozole (ARIMIDEX) 1 MG tablet Take 1 tablet (1 mg total) by mouth daily. 10/22/16   Nicholas Lose, MD  fexofenadine (ALLEGRA) 180 MG tablet Take 1 tablet (180 mg total) by mouth daily. 10/22/16   Nicholas Lose, MD  hydrochlorothiazide (HYDRODIURIL) 12.5 MG tablet Take 1 tablet (12.5 mg total) by mouth daily. 10/24/15   Nicholas Lose, MD  lisinopril (PRINIVIL,ZESTRIL) 10 MG tablet Take 10 mg by mouth daily.    [provider]  Multiple Vitamins-Minerals (MULTIVITAMIN WITH MINERALS) tablet Take 1 tablet by mouth daily.    [provider]  Pitavastatin Calcium 4 MG TABS Take by mouth daily.    [provider]    Family History Family History  Problem Relation Age of Onset  . Heart disease Father   . Melanoma Mother   . Kidney failure Sister 52  . Heart disease Maternal Uncle     Social History Social History  Substance Use Topics  . Smoking status: Never Smoker  . Smokeless tobacco: Never  Used  . Alcohol use 0.0 oz/week     Comment: OCC. WINE     Allergies   Patient has no known allergies.   Review of Systems Review of Systems  Constitutional: Negative for fever.  Cardiovascular: Negative for chest pain.  Gastrointestinal: Negative for abdominal pain and bowel incontinence.  Genitourinary: Negative for bladder incontinence, dysuria and pelvic pain.  Musculoskeletal: Positive for back pain.  Neurological: Negative for tingling, weakness, numbness and headaches.  All other systems reviewed and are negative.    Physical Exam Updated Vital Signs BP (!) 142/91 (BP Location: Right Arm)   Pulse 67   Temp 97.8 F (36.6 C) (Oral)   Resp 16   Ht '4\' 9"'  (1.448 m)   Wt 90.3 kg (199 lb)   SpO2 100%   BMI 43.06 kg/m   Physical Exam  Constitutional: She is oriented to person, place, and  time. She appears well-developed and well-nourished. No distress.  HENT:  Head: Normocephalic and atraumatic.  Mouth/Throat: Oropharynx is clear and moist.  Eyes: Pupils are equal, round, and reactive to light. Conjunctivae and EOM are normal.  Neck: Normal range of motion. Neck supple.  Cardiovascular: Normal rate, regular rhythm and intact distal pulses.   No murmur heard. Pulmonary/Chest: Effort normal and breath sounds normal. No respiratory distress. She has no wheezes. She has no rales.  Abdominal: Soft. She exhibits no distension. There is no tenderness. There is no rebound and no guarding.  Mild right flank pain.  No rashes present.    Musculoskeletal: Normal range of motion. She exhibits no edema or tenderness.  No central thoracic or lumbar pain  Neurological: She is alert and oriented to person, place, and time.  Skin: Skin is warm and dry. Capillary refill takes less than 2 seconds. No rash noted. No erythema.  Psychiatric: She has a normal mood and affect. Her behavior is normal.  Nursing note and vitals reviewed.    ED Treatments / Results  Labs (all labs ordered are listed, but only abnormal results are displayed) Labs Reviewed  COMPREHENSIVE METABOLIC PANEL - Abnormal; Notable for the following:       Result Value   Glucose, Bld 115 (*)    Calcium 10.5 (*)    All other components within normal limits  URINALYSIS, ROUTINE W REFLEX MICROSCOPIC - Abnormal; Notable for the following:    Protein, ur 30 (*)    Leukocytes, UA LARGE (*)    Bacteria, UA RARE (*)    Squamous Epithelial / LPF 0-5 (*)    Non Squamous Epithelial 0-5 (*)    All other components within normal limits  CBC WITH DIFFERENTIAL/PLATELET    EKG  EKG Interpretation None       Radiology Ct Renal Stone Study  Result Date: 04/02/2017 CLINICAL DATA:  Right-sided flank pain since Thursday without hematuria. EXAM: CT ABDOMEN AND PELVIS WITHOUT CONTRAST TECHNIQUE: Multidetector CT imaging of the  abdomen and pelvis was performed following the standard protocol without IV contrast. COMPARISON:  12/27/2014 FINDINGS: Lower chest: There is an elongated partially covered density in the deep and inferior right breast correlating with history of lumpectomy. Previously seen internal fluid is either resolved or not seen. Dermal inclusion cysts in the left para median posterior back. Hepatobiliary: No focal liver abnormality.No evidence of biliary obstruction or stone. Pancreas: Unremarkable. Spleen: Unremarkable. Adrenals/Urinary Tract: Negative adrenals. No hydronephrosis or stone. Unremarkable bladder. Stomach/Bowel: Mild haziness of ileocolic mesentery without detectable bowel wall thickening. No obstruction. No appendicitis. Moderate  sigmoid diverticulosis. Vascular/Lymphatic: No acute vascular abnormality. No mass or adenopathy. Reproductive:Pelvic floor laxity. Uterine prolapse could account for the appearance on prior CT. Other: Small volume pelvic ascites, likely reactive. Musculoskeletal: No acute abnormalities. Severe facet arthropathy with L3-4 and L4-5 anterolisthesis. Disc degeneration from L2-3 to L4-5 is also advanced. Multilevel lumbar foraminal narrowing and spinal stenosis. Osteitis pubis. IMPRESSION: 1. Mild ileocolic mesenteric inflammation with likely reactive small volume pelvic fluid. The associated bowel is not thickened, please correlate for enteritis symptoms. 2. Negative for appendicitis. 3. Sigmoid diverticulosis. 4. Advanced lumbar disc and facet degeneration with multilevel spinal and foraminal stenosis. Electronically Signed   By: Monte Fantasia M.D.   On: 04/02/2017 12:43    Procedures Procedures (including critical care time)  Medications Ordered in ED Medications  morphine 4 MG/ML injection 4 mg (not administered)  ondansetron (ZOFRAN) injection 4 mg (not administered)     Initial Impression / Assessment and Plan / ED Course  I have reviewed the triage vital signs and  the nursing notes.  Pertinent labs & imaging results that were available during my care of the patient were reviewed by me and considered in my medical decision making (see chart for details).    Elderly female presenting with a history of right-sided flank pain for the last 6 days. It initially was just intermittent but in the last 24 hours has become more severe and more constant. Pain is now 9 out of 10. She cannot recall anything that makes it significantly better.  She denies infectious symptoms. She tried taking Tylenol with no improvement. She denies any urinary symptoms. On exam mild pain in the right flank but no centralized spinal pain. She denies any symptoms in her legs or neurologic type complaints. Concern for kidney stone versus pyelonephritis versus bowel related. Patient denies any diarrhea but did have an episode of vomiting today when the pain became severe. She denies any cough, shortness of breath with low concern for cardiac or respiratory cause.  CBC, CMP, UA, CT renal study pending  2:58 PM Labs are reassuring except for concern for UTI.  CT showed mild ileocolic mesenteric inflammation with likely reactive small volume pelvic fluid but no associated bowel wall thicker aching for evidence of enteritis. No other acute findings on CT including no evidence of renal stone. After 1 dose of morphine patient's pain is resolved. Culture was sensitive patient was treated with Keflex. She will continue Tylenol at home and follow-up with her doctor or return if symptoms worsen.   Final Clinical Impressions(s) / ED Diagnoses   Final diagnoses:  Flank pain  Urinary tract infection without hematuria, site unspecified    New Prescriptions New Prescriptions   CEPHALEXIN (KEFLEX) 500 MG CAPSULE    Take 1 capsule (500 mg total) by mouth 2 (two) times daily.     Blanchie Dessert, MD 04/02/17 [phone]

## 2017-04-02 NOTE — ED Notes (Signed)
Patient transported to CT 

## 2017-10-20 ENCOUNTER — Telehealth: Payer: Self-pay | Admitting: Hematology and Oncology

## 2017-10-20 NOTE — Telephone Encounter (Signed)
Patient called to verify appointment

## 2017-10-22 ENCOUNTER — Inpatient Hospital Stay: Payer: Medicare Other | Attending: Hematology and Oncology | Admitting: Hematology and Oncology

## 2017-10-22 ENCOUNTER — Telehealth: Payer: Self-pay | Admitting: Hematology and Oncology

## 2017-10-22 DIAGNOSIS — Z170 Estrogen receptor positive status [ER+]: Secondary | ICD-10-CM

## 2017-10-22 DIAGNOSIS — M8580 Other specified disorders of bone density and structure, unspecified site: Secondary | ICD-10-CM | POA: Diagnosis not present

## 2017-10-22 DIAGNOSIS — N951 Menopausal and female climacteric states: Secondary | ICD-10-CM | POA: Insufficient documentation

## 2017-10-22 DIAGNOSIS — C50511 Malignant neoplasm of lower-outer quadrant of right female breast: Secondary | ICD-10-CM

## 2017-10-22 DIAGNOSIS — Z79899 Other long term (current) drug therapy: Secondary | ICD-10-CM | POA: Insufficient documentation

## 2017-10-22 MED ORDER — ANASTROZOLE 1 MG PO TABS: 1.0000 mg | ORAL_TABLET | Freq: Every day | ORAL | 3 refills | Status: DC

## 2017-10-22 NOTE — Progress Notes (Signed)
Patient Care Team: Glendale Chard, MD as PCP - General (Internal Medicine) Jovita Kussmaul, MD as Consulting Physician (General Surgery) Nicholas Lose, MD as Consulting Physician (Hematology and Oncology) Thea Silversmith, MD (Inactive) as Consulting Physician (Radiation Oncology) Rockwell Germany, RN as Registered Nurse Mauro Kaufmann, RN as Registered Nurse Holley Bouche, NP as Nurse Practitioner (Nurse Practitioner) Sylvan Cheese, NP as Nurse Practitioner (Hematology and Oncology)  DIAGNOSIS:  Encounter Diagnosis  Name Primary?  . Malignant neoplasm of lower-outer quadrant of right breast of female, estrogen receptor positive ([place])     SUMMARY OF ONCOLOGIC HISTORY:   Breast cancer of lower-outer quadrant of right female breast ([place])   08/31/2014 Mammogram    Right breast: irregular mass with spiculated margin and heterogenous calcifications central to the nipple, middle depth      08/31/2014 Breast US    Right breast: 1.5 cm irregular mass, spiculared margin, right breast LOQ, middle depth, hypoechoic with posterior acoustic shadowing      09/01/2014 Initial Biopsy    Right breast core needle bx: invasive ductal carcinoma with DCIS, grade 2, ER+ (100%), PR+ (29%), HER-2 negative (ratio 1.00), Ki-67 21%,       09/08/2014 Breast MRI    Right breast: There is a spiculated enhancing mass with washout kinetics in the lateral right breast at the level of the nipple mid to posterior third depth measuring 1.7 x 1.8 x 1.7 cm with associated clip      09/08/2014 Clinical Stage    Stage IA: T1c N0      09/30/2014 Definitive Surgery    Right lumpectomy/[facility] Marlou Starks): Invasive ductal carcinoma 1.8 cm with DCIS ER+ (100%), PR+ (29%), HER-2 repeated and remains negative (ratio 1.00), Ki-67 21%. 1 N removed and negative for malignancy (0/1).      09/30/2014 Pathologic Stage    Stage IA: pT1c pN0       09/30/2014 Oncotype testing    Score: 17 (11% ROR). No chemotherapy.      11/10/2014 - 12/09/2014 Radiation Therapy    Adjuvant RT Pablo Ledger): Right breast/ 42.72 Gy over 21 fractions. Right breast boost/ 10 Gy over 5 fractions      12/23/2014 -  Anti-estrogen oral therapy    Anastrozole 1 mg daily. Planned duration of therapy 5-10 years.      05/09/2015 Survivorship    Survivorship visit completed and copy of care plan provided to patient.       CHIEF COMPLIANT: Follow-up on anastrozole therapy  INTERVAL HISTORY: Cynthia Barton is a 72 year old with above-mentioned history of right breast cancer treated with lumpectomy and radiation is currently on hormone therapy with anastrozole.  She is tolerating anastrozole fairly well.  She does have intermittent muscle aches and pains and occasional hot flashes.  She complains of being stiff and achy all over.  She thinks she is working a lot taking care of her family.  Her daughter is graduating next month from college and she is excited about that.  REVIEW OF SYSTEMS:   Constitutional: Denies fevers, chills or abnormal weight loss Eyes: Denies blurriness of vision Ears, nose, mouth, throat, and face: Denies mucositis or sore throat Respiratory: Denies cough, dyspnea or wheezes Cardiovascular: Denies palpitation, chest discomfort Gastrointestinal:  Denies nausea, heartburn or change in bowel habits Skin: Denies abnormal skin rashes Lymphatics: Denies new lymphadenopathy or easy bruising Neurological:Denies numbness, tingling or new weaknesses Behavioral/Psych: Mood is stable, no new changes  Extremities: No lower extremity edema Breast: Denies any lumps  or nodules. All other systems were reviewed with the patient and are negative.  I have reviewed the past medical history, past surgical history, social history and family history with the patient and they are unchanged from previous note.  ALLERGIES:  has No Known Allergies.  MEDICATIONS:  Current Outpatient Medications  Medication Sig Dispense Refill  .  acetaminophen (TYLENOL) 325 MG tablet Take 650 mg by mouth daily as needed for mild pain.    Marland Kitchen anastrozole (ARIMIDEX) 1 MG tablet Take 1 tablet (1 mg total) by mouth daily. 90 tablet 3  . cephALEXin (KEFLEX) 500 MG capsule Take 1 capsule (500 mg total) by mouth 2 (two) times daily. 14 capsule 0  . fexofenadine (ALLEGRA) 180 MG tablet Take 1 tablet (180 mg total) by mouth daily.    . hydrochlorothiazide (HYDRODIURIL) 12.5 MG tablet Take 1 tablet (12.5 mg total) by mouth daily.    Marland Kitchen lisinopril (PRINIVIL,ZESTRIL) 10 MG tablet Take 10 mg by mouth daily.    . Multiple Vitamins-Minerals (MULTIVITAMIN WITH MINERALS) tablet Take 1 tablet by mouth daily.    . Pitavastatin Calcium 4 MG TABS Take by mouth daily.     No current facility-administered medications for this visit.     PHYSICAL EXAMINATION: ECOG PERFORMANCE STATUS: 1 - Symptomatic but completely ambulatory  Vitals:   10/22/17 1048  BP: 135/72  Pulse: 86  Resp: 17  Temp: 97.9 F (36.6 C)  SpO2: 99%   Filed Weights   10/22/17 1048  Weight: 195 lb 14.4 oz (88.9 kg)    GENERAL:alert, no distress and comfortable SKIN: skin color, texture, turgor are normal, no rashes or significant lesions EYES: normal, Conjunctiva are pink and non-injected, sclera clear OROPHARYNX:no exudate, no erythema and lips, buccal mucosa, and tongue normal  NECK: supple, thyroid normal size, non-tender, without nodularity LYMPH:  no palpable lymphadenopathy in the cervical, axillary or inguinal LUNGS: clear to auscultation and percussion with normal breathing effort HEART: regular rate & rhythm and no murmurs and no lower extremity edema ABDOMEN:abdomen soft, non-tender and normal bowel sounds MUSCULOSKELETAL:no cyanosis of digits and no clubbing  NEURO: alert & oriented x 3 with fluent speech, no focal motor/sensory deficits EXTREMITIES: No lower extremity edema BREAST: No palpable masses or nodules in either right or left breasts. No palpable axillary  supraclavicular or infraclavicular adenopathy no breast tenderness or nipple discharge. (exam performed in the presence of a chaperone)  LABORATORY DATA:  I have reviewed the data as listed CMP Latest Ref Rng & Units 04/02/2017 12/22/2014 09/07/2014  Glucose 65 - 99 mg/dL 115(H) 103 98  BUN 6 - 20 mg/dL 13 9.6 11.3  Creatinine 0.44 - 1.00 mg/dL 0.86 0.8 0.9  Sodium 135 - 145 mmol/L 137 140 142  Potassium 3.5 - 5.1 mmol/L 4.0 3.9 4.3  Chloride 101 - 111 mmol/L 102 - -  CO2 22 - 32 mmol/L '27 25 27  ' Calcium 8.9 - 10.3 mg/dL 10.5(H) 10.1 10.6(H)  Total Protein 6.5 - 8.1 g/dL 7.8 6.8 7.2  Total Bilirubin 0.3 - 1.2 mg/dL 0.8 0.75 0.44  Alkaline Phos 38 - 126 U/L 57 56 51  AST 15 - 41 U/L '26 25 27  ' ALT 14 - 54 U/L '21 21 22    ' Lab Results  Component Value Date   WBC 6.6 04/02/2017   HGB 14.0 04/02/2017   HCT 40.8 04/02/2017   MCV 85.2 04/02/2017   PLT 208 04/02/2017   NEUTROABS 4.5 04/02/2017    ASSESSMENT & PLAN:  Breast  cancer of lower-outer quadrant of right female breast Right lumpectomy 09/30/2014: 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 29%, HER-2 negative ratio 1, Ki-67 21%, T1 cN0 M0 stage IA Oncotype Dx 17 (Low Risk) S/P XRT  Current treatment: Antiestrogen therapy once daily for 5 years started 12/23/2014   Anastrozole toxicities: 1. Occasional hot flashes 2. Muscle aches and pains  Breast cancer surveillance:  1. Breast exam 10/22/2017: No palpable nodules or concerns 2. Mammogram 08/29/2017 at [facility]: Postsurgical changes breast density category B 3. Bone density 01/10/2015: T score -1.3 mild osteopenia: Continue with calcium and vitamin D   Return to clinic in 1 year for follow-up.  No orders of the defined types were placed in this encounter.  The patient has a good understanding of the overall plan. she agrees with it. she will call with any problems that may develop before the next visit here.   Harriette Ohara, MD 10/22/17

## 2017-10-22 NOTE — Telephone Encounter (Signed)
Gave avs and calendar ° °

## 2017-10-22 NOTE — Assessment & Plan Note (Signed)
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 29%, HER-2 negative ratio 1, Ki-67 21%, T1 cN0 M0 stage IA Oncotype Dx 17 (Low Risk) S/P XRT  Current treatment: Antiestrogen therapy once daily for 5 years started 12/23/2014   Anastrozole toxicities: 1. Occasional hot flashes 2. Muscle aches and pains especially left lower extremity this started after she fell at work. Received physical therapy.  Intermittent breast pain: No palpable lumps or nodules   Breast cancer surveillance:  1. Breast exam 10/22/2017: No palpable nodules or concerns 2. Mammogram 08/29/2017 at [facility]: Postsurgical changes breast density category B 3. Bone density 01/10/2015: T score -1.3 mild osteopenia: Continue with calcium and vitamin D   Return to clinic in 1 year for follow-up.

## 2017-11-14 ENCOUNTER — Telehealth: Payer: Self-pay

## 2017-11-14 NOTE — Telephone Encounter (Signed)
Received fax from [facility] from [facility] [address] - pt's pharmacy regarding recall of Anastrozole - called pharmacy spoke to "kim" and she said it was a [place] recall, certain lot numbers were recalled but they were able to request a shipment that was not part of recall and expect it by Tues afternoon.  Called pt with this information, she had verbalized per pharmacy that she was not interested in switching to another medication, and let her know if continued to be issue she may have to discuss with Dr Lindi Adie other options as fax said, if there continues to be an issue of recall of this medication. No other needs per pt at this time.  She will call Tues afternoon to see if shipment has arrived so she can pick up her Anastrozole refill.

## 2017-11-27 DIAGNOSIS — I129 Hypertensive chronic kidney disease with stage 1 through stage 4 chronic kidney disease, or unspecified chronic kidney disease: Secondary | ICD-10-CM | POA: Insufficient documentation

## 2017-11-27 DIAGNOSIS — N182 Chronic kidney disease, stage 2 (mild): Principal | ICD-10-CM

## 2017-11-27 LAB — HEPATIC FUNCTION PANEL
ALK PHOS: 73 (ref 25–125)
ALT: 31 (ref 7–35)
AST: 34 (ref 13–35)
Bilirubin, Total: 0.3

## 2017-11-27 LAB — LIPID PANEL
Cholesterol: 144 (ref 0–200)
HDL: 43 (ref 35–70)
LDL Cholesterol: 82
TRIGLYCERIDES: 95 (ref 40–160)

## 2017-11-27 LAB — BASIC METABOLIC PANEL
BUN: 13 (ref 4–21)
CREATININE: 0.7 (ref 0.5–1.1)
Glucose: 102
Potassium: 4.3 (ref 3.4–5.3)
Sodium: 140 (ref 137–147)

## 2017-11-27 LAB — HEMOGLOBIN A1C: HEMOGLOBIN A1C: 6.1

## 2018-03-17 ENCOUNTER — Telehealth: Payer: Self-pay | Admitting: Internal Medicine

## 2018-03-17 NOTE — Telephone Encounter (Signed)
I left a message letting the patient know that her 04/14/18 appointment has been changed to 04/15/18. VDM (DD)

## 2018-04-11 ENCOUNTER — Encounter: Payer: Self-pay | Admitting: Internal Medicine

## 2018-04-11 DIAGNOSIS — I129 Hypertensive chronic kidney disease with stage 1 through stage 4 chronic kidney disease, or unspecified chronic kidney disease: Secondary | ICD-10-CM

## 2018-04-11 DIAGNOSIS — N182 Chronic kidney disease, stage 2 (mild): Principal | ICD-10-CM

## 2018-04-15 ENCOUNTER — Ambulatory Visit: Payer: BC Managed Care – PPO

## 2018-04-15 ENCOUNTER — Ambulatory Visit: Payer: BC Managed Care – PPO | Admitting: Internal Medicine

## 2018-04-23 ENCOUNTER — Ambulatory Visit: Payer: Medicare Other | Admitting: Internal Medicine

## 2018-04-23 ENCOUNTER — Encounter: Payer: Self-pay | Admitting: Internal Medicine

## 2018-04-23 VITALS — BP 108/72 | HR 67 | Temp 98.2°F | Ht <= 58 in | Wt 183.8 lb

## 2018-04-23 DIAGNOSIS — I129 Hypertensive chronic kidney disease with stage 1 through stage 4 chronic kidney disease, or unspecified chronic kidney disease: Secondary | ICD-10-CM | POA: Diagnosis not present

## 2018-04-23 DIAGNOSIS — Z23 Encounter for immunization: Secondary | ICD-10-CM

## 2018-04-23 DIAGNOSIS — N182 Chronic kidney disease, stage 2 (mild): Secondary | ICD-10-CM | POA: Diagnosis not present

## 2018-04-23 DIAGNOSIS — L309 Dermatitis, unspecified: Secondary | ICD-10-CM | POA: Diagnosis not present

## 2018-04-23 DIAGNOSIS — R7309 Other abnormal glucose: Secondary | ICD-10-CM

## 2018-04-23 MED ORDER — TRIAMCINOLONE ACETONIDE 0.1 % EX CREA: 1.0000 "application " | TOPICAL_CREAM | Freq: Two times a day (BID) | CUTANEOUS | 0 refills | Status: DC

## 2018-04-23 MED ORDER — ZOSTER VAC RECOMB ADJUVANTED 50 MCG/0.5ML IM SUSR: 0.5000 mL | Freq: Once | INTRAMUSCULAR | 1 refills | Status: AC

## 2018-04-24 LAB — CMP14+EGFR
ALK PHOS: 73 IU/L (ref 39–117)
ALT: 21 IU/L (ref 0–32)
AST: 28 IU/L (ref 0–40)
Albumin/Globulin Ratio: 1.3 (ref 1.2–2.2)
Albumin: 4.1 g/dL (ref 3.5–4.8)
BUN / CREAT RATIO: 15 (ref 12–28)
BUN: 14 mg/dL (ref 8–27)
Bilirubin Total: 0.5 mg/dL (ref 0.0–1.2)
CALCIUM: 11.4 mg/dL — AB (ref 8.7–10.3)
CHLORIDE: 102 mmol/L (ref 96–106)
CO2: 23 mmol/L (ref 20–29)
CREATININE: 0.96 mg/dL (ref 0.57–1.00)
GFR, EST AFRICAN AMERICAN: 68 mL/min/{1.73_m2} (ref >59)
GFR, EST NON AFRICAN AMERICAN: 59 mL/min/{1.73_m2} — AB (ref >59)
GLOBULIN, TOTAL: 3.1 g/dL (ref 1.5–4.5)
Glucose: 92 mg/dL (ref 65–99)
Potassium: 4.6 mmol/L (ref 3.5–5.2)
Sodium: 140 mmol/L (ref 134–144)
Total Protein: 7.2 g/dL (ref 6.0–8.5)

## 2018-04-24 LAB — HEMOGLOBIN A1C
Est. average glucose Bld gHb Est-mCnc: 123 mg/dL
Hgb A1c MFr Bld: 5.9 % — ABNORMAL HIGH (ref 4.8–5.6)

## 2018-04-24 NOTE — Progress Notes (Signed)
Your kidney fxn is stable. Be sure to stay well hydrated.  Your calcium level is elevated. KW--pls add SPEP dx: E83.52. Please check [employer] to see if PTH intact  Has been done in past year. If not, she needs lab appt for PTH intact dx: E83.52. Your liver fxn is nl. Your hba1c is 5.9, this is in prediabetes range.

## 2018-04-26 ENCOUNTER — Encounter: Payer: Self-pay | Admitting: Internal Medicine

## 2018-04-26 NOTE — Progress Notes (Addendum)
Subjective:    Cynthia Barton is a 72 y.o. female who presents for Medicare Annual/Subsequent exam..  Preventive Screening-Counseling & Management  Tobacco Social History   Tobacco Use  Smoking Status Never Smoker  Smokeless Tobacco Never Used     Problems Prior to Visit Please see belo  Current Problems (verified) Patient Active Problem List   Diagnosis Date Noted  . Chronic renal disease, stage II 04/23/2018  . Other abnormal glucose 04/23/2018  . Benign hypertension with CKD (chronic kidney disease), stage II 11/27/2017  . Hip pain 12/30/2014  . Breast cancer of lower-outer quadrant of right female breast ([place]) 09/06/2014    Medications Prior to Visit Current Outpatient Medications on File Prior to Visit  Medication Sig Dispense Refill  . anastrozole (ARIMIDEX) 1 MG tablet Take 1 tablet (1 mg total) by mouth daily. 90 tablet 3  . hydrochlorothiazide (HYDRODIURIL) 12.5 MG tablet Take 1 tablet (12.5 mg total) by mouth daily.    Marland Kitchen lisinopril (PRINIVIL,ZESTRIL) 10 MG tablet Take 10 mg by mouth daily.    . Multiple Vitamins-Minerals (MULTIVITAMIN WITH MINERALS) tablet Take 1 tablet by mouth daily.    . Pitavastatin Calcium (LIVALO) 2 MG TABS Take by mouth.     No current facility-administered medications on file prior to visit.     Current Medications (verified) Current Outpatient Medications  Medication Sig Dispense Refill  . anastrozole (ARIMIDEX) 1 MG tablet Take 1 tablet (1 mg total) by mouth daily. 90 tablet 3  . hydrochlorothiazide (HYDRODIURIL) 12.5 MG tablet Take 1 tablet (12.5 mg total) by mouth daily.    Marland Kitchen lisinopril (PRINIVIL,ZESTRIL) 10 MG tablet Take 10 mg by mouth daily.    . Multiple Vitamins-Minerals (MULTIVITAMIN WITH MINERALS) tablet Take 1 tablet by mouth daily.    . Pitavastatin Calcium (LIVALO) 2 MG TABS Take by mouth.    . triamcinolone cream (KENALOG) 0.1 % Apply 1 application topically 2 (two) times daily. PRN 45 g 0   No current  facility-administered medications for this visit.      Allergies (verified) Patient has no known allergies.   PAST HISTORY  Family History Family History  Problem Relation Age of Onset  . Heart disease Father   . Melanoma Mother   . Kidney failure Sister 40  . Heart disease Maternal Uncle     Social History Social History   Tobacco Use  . Smoking status: Never Smoker  . Smokeless tobacco: Never Used  Substance Use Topics  . Alcohol use: Yes    Alcohol/week: 0.0 standard drinks    Comment: OCC. WINE     Are there smokers in your home (other than you)? no  Risk Factors Current exercise habits: Minimal.  Dietary issues discussed:  Follows low salt diet  Cardiac risk factors: advanced age (older than 40 for men, 48 for women).htn, postmenopausal state  Depression Screen (Note: if answer to either of the following is "Yes", a more complete depression screening is indicated)   Over the past two weeks, have you felt down, depressed or hopeless? no  Over the past two weeks, have you felt little interest or pleasure in doing things? no Have you lost interest or pleasure in daily life? no  Do you often feel hopeless? no  Do you cry easily over simple problems? no  Activities of Daily Living In your present state of health, do you have any difficulty performing the following activities?:  Driving? Yes Managing money?  No Feeding yourself? No Getting from bed to chair?  No Climbing a flight of stairs? no Preparing food and eating?: no Bathing or showering? no Getting dressed: no Getting to the toilet? No Using the toilet:no Moving around from place to place: no In the past year have you fallen or had a near fall?:no   Are you sexually active?  no  Do you have more than one partner?  N/a  Hearing Difficulties: no Do you often ask people to speak up or repeat themselves? no Do you experience ringing or noises in your ears? no Do you have difficulty understanding soft  or whispered voices? no   Do you feel that you have a problem with memory? No  Do you often misplace items? no  Do you feel safe at home?  no  Cognitive Testing  Alert? yes  Normal Appearance?Yes  Oriented to person? Yes  Place? Yes   Time? Yes  Recall of three objects?  Yes  Can perform simple calculations? Yes  Displays appropriate judgment?Yes  Can read the correct time from a watch face?Yes   Advanced Directives have been discussed with the patient? Yes  List the Names of Other Physician/Practitioners you currently use: 1.    Indicate any recent Medical Services you may have received from other than [facility] providers in the past year (date may be approximate).  Immunization History  Administered Date(s) Administered  . Influenza, High Dose Seasonal PF 04/23/2018    Screening Tests Health Maintenance  Topic Date Due  . Hepatitis C Screening  07/18/1945  . TETANUS/TDAP  08/29/1964  . MAMMOGRAM  08/30/1995  . PNA vac Low Risk Adult (1 of 2 - PCV13) 08/30/2010  . COLONOSCOPY  07/27/2022  . INFLUENZA VACCINE  Completed  . DEXA SCAN  Completed    All answers were reviewed with the patient and necessary referrals were made:  [name], MD   04/26/2018   She is here today for her annual wellness visit. She is also here for f/u htn and hyperlipidemia. She reports compliance with her medications.   Hypertension  This is a chronic problem. The current episode started more than 1 year ago. The problem has been gradually improving since onset. The problem is controlled. Pertinent negatives include no blurred vision, chest pain, headaches, palpitations or shortness of breath. Risk factors for coronary artery disease include sedentary lifestyle, stress, post-menopausal state, obesity and dyslipidemia. Compliance problems include exercise.   Hyperlipidemia  Pertinent negatives include no chest pain or shortness of breath.  She reports compliance with medication.   History  reviewed: allergies, current medications, past family history, past medical history, past social history, past surgical history and problem list  Review of Systems Review of Systems  Constitutional: Negative.   HENT: Negative.   Eyes: Negative.  Negative for blurred vision.  Respiratory: Negative for shortness of breath.   Cardiovascular: Negative.  Negative for chest pain and palpitations.  Gastrointestinal: Negative.   Genitourinary: Negative.   Musculoskeletal: Negative.   Skin: Negative.   Neurological: Negative.  Negative for headaches.  Endo/Heme/Allergies: Negative.   Psychiatric/Behavioral: Negative.      Objective:     Body mass index is 41.21 kg/m. BP 108/72 (BP Location: Left Arm, Patient Position: Sitting, Cuff Size: Large)   Pulse 67   Temp 98.2 F (36.8 C) (Oral)   Ht '4\' 8"'  (1.422 m)   Wt 183 lb 12.8 oz (83.4 kg)   BMI 41.21 kg/m   Physical Exam  Constitutional: She is oriented to person, place, and time and  well-developed, well-nourished, and in no distress.  HENT:  Head: Normocephalic and atraumatic.  Eyes: EOM are normal.  Neck: Normal range of motion.  Cardiovascular: Normal rate, regular rhythm and normal heart sounds.  Pulmonary/Chest: Effort normal and breath sounds normal.  Abdominal: Soft. Bowel sounds are normal.  Genitourinary:  Genitourinary Comments: DEFERRED  Neurological: She is alert and oriented to person, place, and time.  Skin: Skin is warm and dry. Rash noted.  Psychiatric: Affect normal.  Nursing note and vitals reviewed.        Assessment:     1. Hypertensive nephropathy  CONTROLLED. SHE WILL CONTINUE WITH CURRENT MEDS FOR NOW. SHE IS ENCOURAGED TO AVOID ADDING SALT TO HER FOODS. SHE WILL RTO IN SIX MONTHS FOR RE-EVALUATION. THE ANNUAL WELLNESS VISIT WAS PERFORMED INCLUDING DISCUSSION OF ADVANCED DIRECTIVES, ASSESSMENT OF COGNITIVE FUNCTION.  - CMP14+EGFR  2. Chronic renal disease, stage II  CHRONIC. I WILL CHECK A  GFR, CR TODAY. SHE IS ENCOURAGED RO STAY WELL HYDRATED.   3. Other abnormal glucose  HER A1C HAS BEEN ELEVATED IN THE PAST. I WILL CHECK AN A1C, BMET TODAY. SHE WAS ENCOURAGED TO AVOID SUGARY BEVERAGES AND PROCESSED FOODS INCLUDNG BREADS, RICE AND PASTA.  - Hemoglobin A1c  4. Dermatitis  SHE WAS GIVEN RX TRIAMCINOLONE CREAM TO AFFECTED AREA TWICE DAILY AS NEEDED. DAUGHTER IS ENCOURAGED TO LET ME KNOW IF SHE DOES NOT NOTICE ANY IMPROVEMENT AFTER A WEEK OR SO.  5. Need for vaccination  - Flu vaccine HIGH DOSE PF (Fluzone High dose)      Plan:     During the course of the visit the patient was educated and counseled about appropriate screening and preventive services including:    IMMUNIZATIONS  Patient Instructions (the written plan) was given to the patient.  Medicare Attestation I have personally reviewed: The patient's medical and social history Their use of alcohol, tobacco or illicit drugs Their current medications and supplements The patient's functional ability including ADLs,fall risks, home safety risks, cognitive, and hearing and visual impairment Diet and physical activities Evidence for depression or mood disorders.  The patient's weight, height, BMI, and visual acuity have been recorded in the chart.  I have made referrals, counseling, and provided education to the patient based on review of the above and I have provided the patient with a written personalized care plan for preventive services.     [name], MD   04/26/2018

## 2018-05-01 ENCOUNTER — Other Ambulatory Visit: Payer: Medicare Other

## 2018-05-01 NOTE — Addendum Note (Signed)
Addended by: Haskel Khan A on: 05/01/2018 10:18 AM   Modules accepted: Orders

## 2018-05-03 NOTE — Patient Instructions (Signed)
  Cynthia Barton , Thank you for taking time to come for your Medicare Wellness Visit. I appreciate your ongoing commitment to your health goals. Please review the following plan we discussed and let me know if I can assist you in the future.   These are the goals we discussed: Goals   None     This is a list of the screening recommended for you and due dates:  Health Maintenance  Topic Date Due  .  Hepatitis C: One time screening is recommended by Center for Disease Control  (CDC) for  adults born from [birth year] through 1965.   1946/04/10  . Tetanus Vaccine  08/29/1964  . Mammogram  08/30/1995  . Pneumonia vaccines (1 of 2 - PCV13) 08/30/2010  . Colon Cancer Screening  07/27/2022  . Flu Shot  Completed  . DEXA scan (bone density measurement)  Completed

## 2018-05-04 LAB — PROTEIN ELECTROPHORESIS, SERUM, WITH REFLEX
A/G RATIO SPE: 1.2 (ref 0.7–1.7)
Albumin ELP: 3.6 g/dL (ref 2.9–4.4)
Alpha 1: 0.2 g/dL (ref 0.0–0.4)
Alpha 2: 0.6 g/dL (ref 0.4–1.0)
Beta: 1 g/dL (ref 0.7–1.3)
GLOBULIN, TOTAL: 3 g/dL (ref 2.2–3.9)
Gamma Globulin: 1.2 g/dL (ref 0.4–1.8)
Total Protein: 6.6 g/dL (ref 6.0–8.5)

## 2018-05-04 LAB — PTH, INTACT AND CALCIUM
CALCIUM: 10.5 mg/dL — AB (ref 8.7–10.3)
PTH: 27 pg/mL (ref 15–65)

## 2018-05-18 ENCOUNTER — Other Ambulatory Visit: Payer: Self-pay | Admitting: Nurse Practitioner

## 2018-05-18 ENCOUNTER — Other Ambulatory Visit: Payer: Self-pay | Admitting: Internal Medicine

## 2018-07-09 ENCOUNTER — Ambulatory Visit: Payer: Medicare Other

## 2018-07-09 ENCOUNTER — Ambulatory Visit (INDEPENDENT_AMBULATORY_CARE_PROVIDER_SITE_OTHER): Payer: Medicare Other | Admitting: Internal Medicine

## 2018-07-09 VITALS — BP 142/82 | HR 71 | Temp 98.2°F | Ht 59.8 in | Wt 187.8 lb

## 2018-07-09 VITALS — BP 142/82 | HR 71 | Temp 98.2°F | Ht 59.5 in | Wt 187.8 lb

## 2018-07-09 DIAGNOSIS — Z0000 Encounter for general adult medical examination without abnormal findings: Secondary | ICD-10-CM

## 2018-07-09 DIAGNOSIS — E8881 Metabolic syndrome: Secondary | ICD-10-CM

## 2018-07-09 DIAGNOSIS — I129 Hypertensive chronic kidney disease with stage 1 through stage 4 chronic kidney disease, or unspecified chronic kidney disease: Secondary | ICD-10-CM

## 2018-07-09 DIAGNOSIS — Z6837 Body mass index (BMI) 37.0-37.9, adult: Secondary | ICD-10-CM

## 2018-07-09 DIAGNOSIS — M79641 Pain in right hand: Secondary | ICD-10-CM

## 2018-07-09 DIAGNOSIS — M79642 Pain in left hand: Secondary | ICD-10-CM

## 2018-07-09 DIAGNOSIS — N182 Chronic kidney disease, stage 2 (mild): Secondary | ICD-10-CM

## 2018-07-09 LAB — POCT URINALYSIS DIPSTICK
BILIRUBIN UA: NEGATIVE
GLUCOSE UA: NEGATIVE
Ketones, UA: NEGATIVE
Nitrite, UA: NEGATIVE
Protein, UA: NEGATIVE
SPEC GRAV UA: 1.025 (ref 1.010–1.025)
Urobilinogen, UA: 0.2 E.U./dL
pH, UA: 5 (ref 5.0–8.0)

## 2018-07-09 LAB — POCT UA - MICROALBUMIN
Albumin/Creatinine Ratio, Urine, POC: 30
Creatinine, POC: 200 mg/dL
Microalbumin Ur, POC: 30 mg/L

## 2018-07-09 NOTE — Progress Notes (Signed)
Subjective:   Cynthia Barton is a 73 y.o. female who presents for Medicare Annual (Subsequent) preventive examination.  Review of Systems:  n/a Cardiac Risk Factors include: advanced age (>37mn, >>41women);obesity (BMI >30kg/m2)     Objective:     Vitals: BP (!) 142/82 (BP Location: Left Arm, Patient Position: Sitting)   Pulse 71   Temp 98.2 F (36.8 C) (Oral)   Ht 4' 11.8" (1.519 m)   Wt 187 lb 12.8 oz (85.2 kg)   SpO2 96%   BMI 36.92 kg/m   Body mass index is 36.92 kg/m.  Advanced Directives 07/09/2018 04/02/2017 10/22/2016 04/24/2016 10/24/2015 04/10/2015 12/19/2014  Does Patient Have a Medical Advance Directive? _0  No No  Would patient like information on creating a medical advance directive? No - Patient declined - - - - No - patient declined information No - patient declined information    Tobacco Social History   Tobacco Use  Smoking Status Never Smoker  Smokeless Tobacco Never Used     Counseling given: Not Answered   Clinical Intake:  Pre-visit preparation completed: Yes  Pain : No/denies pain Pain Score: 0-No pain     Nutritional Status: BMI > 30  Obese Nutritional Risks: None Diabetes: No  How often do you need to have someone help you when you read instructions, pamphlets, or other written materials from your doctor or pharmacy?: 1 - Never What is the last grade level you completed in school?: business school  Interpreter Needed?: No  Information entered by :: NAllen LPN  Past Medical History:  Diagnosis Date  . [place] cancer [place] [place] 09/07/2014   ER+/PR+/Her2- right breast  . Breast cancer of lower-outer quadrant of right [place] breast [place] 09/06/2014  . Hyperlipidemia   . Hypertension   . Radiation 11/10/14-12/09/14   Right Breast  . Uterine prolapse 2016  . Wears dentures    top  . Wears glasses    Past Surgical History:  Procedure Laterality Date  . BREAST SURGERY  1980   rt br bx-no anesth  . COLONOSCOPY  07/27/12   mild  diverticulosis  . lumpectomy right breast Right 09/30/14  . TUBAL LIGATION     Family History  Problem Relation Age of Onset  . Heart disease Father   . Melanoma Mother   . Kidney failure Sister 492 . Heart disease Maternal Uncle    Social History   Socioeconomic History  . Marital status: Divorced    Spouse name: Not on file  . Number of children: Not on file  . Years of education: Not on file  . Highest education level: Not on file  Occupational History  . Occupation: retired  SScientific laboratory technician . Financial resource strain: Not hard at all  . Food insecurity:    Worry: Never true    Inability: Not on file  . Transportation needs:    Medical: No    Non-medical: No  Tobacco Use  . Smoking status: Never Smoker  . Smokeless tobacco: Never Used  Substance and Sexual Activity  . Alcohol use: Not Currently    Alcohol/week: 0.0 standard drinks  . Drug use: No  . Sexual activity: Not Currently    Comment: 1st intercourse 73 yo-Fewer than 5 partners  Lifestyle  . Physical activity:    Days per week: 3 days    Minutes per session: 50 min  . Stress: Not at all  Relationships  . Social connections:    Talks on  phone: Not on file    Gets together: Not on file    Attends religious service: Not on file    Active member of club or organization: Not on file    Attends meetings of clubs or organizations: Not on file    Relationship status: Not on file  Other Topics Concern  . Not on file  Social History Narrative  . Not on file    Outpatient Encounter Medications as of 07/09/2018  Medication Sig  . anastrozole (ARIMIDEX) 1 MG tablet Take 1 tablet (1 mg total) by mouth daily.  . hydrochlorothiazide (HYDRODIURIL) 12.5 MG tablet Take 1 tablet (12.5 mg total) by mouth daily.  Marland Kitchen lisinopril (PRINIVIL,ZESTRIL) 10 MG tablet TAKE 1 TABLET BY MOUTH ONCE DAILY  . Multiple Vitamins-Minerals (MULTIVITAMIN WITH MINERALS) tablet Take 1 tablet by mouth daily.  . Pitavastatin Calcium (LIVALO) 2  MG TABS Take by mouth. Takes on Monday Wednesday and Friday  . triamcinolone cream (KENALOG) 0.1 % Apply 1 application topically 2 (two) times daily. PRN  . hydrochlorothiazide (MICROZIDE) 12.5 MG capsule TAKE 1 CAPSULE BY MOUTH ONCE DAILY (Patient not taking: Reported on 07/09/2018)   No facility-administered encounter medications on file as of 07/09/2018.     Activities of Daily Living In your present state of health, do you have any difficulty performing the following activities: 07/09/2018 04/23/2018  Hearing? N Y  Vision? N N  Difficulty concentrating or making decisions? N N  Walking or climbing stairs? Y N  Comment sometimes legs get stiff -  Dressing or bathing? N N  Doing errands, shopping? N N  Preparing Food and eating ? N -  Using the Toilet? N -  In the past six months, have you accidently leaked urine? Y -  Comment wears depends -  Do you have problems with loss of bowel control? N -  Managing your Medications? N -  Managing your Finances? N -  Housekeeping or managing your Housekeeping? N -  Some recent data might be hidden    Patient Care Team: Glendale Chard, MD as PCP - General (Internal Medicine) Jovita Kussmaul, MD as Consulting Physician (General Surgery) Nicholas Lose, MD as Consulting Physician (Hematology and Oncology) Thea Silversmith, MD as Consulting Physician (Radiation Oncology) Rockwell Germany, RN as Registered Nurse Mauro Kaufmann, RN as Registered Nurse Holley Bouche, NP (Inactive) as Nurse Practitioner (Nurse Practitioner) Sylvan Cheese, NP as Nurse Practitioner (Hematology and Oncology)    Assessment:   This is a routine wellness examination for Cynthia Barton.  Exercise Activities and Dietary recommendations Current Exercise Habits: Structured exercise class, Type of exercise: walking;strength training/weights;calisthenics, Time (Minutes): 50, Frequency (Times/Week): 3, Weekly Exercise (Minutes/Week): 150, Intensity: Moderate, Exercise  limited by: None identified  Goals    . Patient Stated (pt-stated)     Wants to go on a cruise.       Fall Risk Fall Risk  07/09/2018 04/23/2018 10/26/2014  Falls in the past year? 0 No No  Risk for fall due to : Medication side effect - -  Follow up Falls prevention discussed;Education provided - -   Is the patient's home free of loose throw rugs in walkways, pet beds, electrical cords, etc?   yes      Grab bars in the bathroom? no      Handrails on the stairs?   yes      Adequate lighting?   yes  Timed Get Up and Go performed: n/a  Depression Screen PHQ 2/9  Scores 07/09/2018 04/23/2018 10/26/2014  PHQ - 2 Score 0 0 0  PHQ- 9 Score 0 - -     Cognitive Function     6CIT Screen 07/09/2018  What Year? 0 points  What month? 0 points  What time? 0 points  Count back from 20 0 points  Months in reverse 0 points  Repeat phrase 2 points  Total Score 2    Immunization History  Administered Date(s) Administered  . Influenza, High Dose Seasonal PF 04/23/2018    Qualifies for Shingles Vaccine? yes  Screening Tests Health Maintenance  Topic Date Due  . PNA vac Low Risk Adult (1 of 2 - PCV13) 07/10/2019 (Originally 08/30/2010)  . MAMMOGRAM  09/05/2019  . COLONOSCOPY  07/27/2022  . TETANUS/TDAP  07/03/2023  . INFLUENZA VACCINE  Completed  . DEXA SCAN  Completed  . Hepatitis C Screening  Completed    Cancer Screenings: Lung: Low Dose CT Chest recommended if Age 27-80 years, 30 pack-year currently smoking OR have quit w/in 15years. Patient does not qualify. Breast:  Up to date on Mammogram? Yes   Up to date of Bone Density/Dexa? Yes Colorectal: up to date  Additional Screenings: : Hepatitis C Screening: completed <0.1     Plan:    Declined prevnar 13 and shingles vaccines at this time.   I have personally reviewed and noted the following in the patient's chart:   . Medical and social history . Use of alcohol, tobacco or illicit drugs  . Current medications and  supplements . Functional ability and status . Nutritional status . Physical activity . Advanced directives . List of other physicians . Hospitalizations, surgeries, and ER visits in previous 12 months . Vitals . Screenings to include cognitive, depression, and falls . Referrals and appointments  In addition, I have reviewed and discussed with patient certain preventive protocols, quality metrics, and best practice recommendations. A written personalized care plan for preventive services as well as general preventive health recommendations were provided to patient.     Kellie Simmering, LPN  [id number]

## 2018-07-09 NOTE — Patient Instructions (Addendum)
[name] , Thank you for taking time to come for your Medicare Wellness Visit. I appreciate your ongoing commitment to your health goals. Please review the following plan we discussed and let me know if I can assist you in the future.   Screening recommendations/referrals: Colonoscopy: 07/2012 Mammogram: 08/2017 Bone Density: 03/2017 Recommended yearly ophthalmology/optometry visit for glaucoma screening and checkup Recommended yearly dental visit for hygiene and checkup  Vaccinations: Influenza vaccine: 03/2018 Pneumococcal vaccine: 04/2016 Tdap vaccine: 06/2013 Shingles vaccine: declined    Advanced directives: Advance directive discussed with you today. Even though you declined this today please call our office should you change your mind and we can give you the proper paperwork for you to fill out.   Conditions/risks identified: Obesity  Next appointment: 07/09/2018 at 10:00a   Preventive Care 65 Years and Older, Female Preventive care refers to lifestyle choices and visits with your health care provider that can promote health and wellness. What does preventive care include?  A yearly physical exam. This is also called an annual well check.  Dental exams once or twice a year.  Routine eye exams. Ask your health care provider how often you should have your eyes checked.  Personal lifestyle choices, including:  Daily care of your teeth and gums.  Regular physical activity.  Eating a healthy diet.  Avoiding tobacco and drug use.  Limiting alcohol use.  Practicing safe sex.  Taking low-dose aspirin every day.  Taking vitamin and mineral supplements as recommended by your health care provider. What happens during an annual well check? The services and screenings done by your health care provider during your annual well check will depend on your age, overall health, lifestyle risk factors, and family history of disease. Counseling  Your health care provider may ask  you questions about your:  Alcohol use.  Tobacco use.  Drug use.  Emotional well-being.  Home and relationship well-being.  Sexual activity.  Eating habits.  History of falls.  Memory and ability to understand (cognition).  Work and work Statistician.  Reproductive health. Screening  You may have the following tests or measurements:  Height, weight, and BMI.  Blood pressure.  Lipid and cholesterol levels. These may be checked every 5 years, or more frequently if you are over 63 years old.  Skin check.  Lung cancer screening. You may have this screening every year starting at age 84 if you have a 30-pack-year history of smoking and currently smoke or have quit within the past 15 years.  Fecal occult blood test (FOBT) of the stool. You may have this test every year starting at age 69.  Flexible sigmoidoscopy or colonoscopy. You may have a sigmoidoscopy every 5 years or a colonoscopy every 10 years starting at age 66.  Hepatitis C blood test.  Hepatitis B blood test.  Sexually transmitted disease (STD) testing.  Diabetes screening. This is done by checking your blood sugar (glucose) after you have not eaten for a while (fasting). You may have this done every 1-3 years.  Bone density scan. This is done to screen for osteoporosis. You may have this done starting at age 46.  Mammogram. This may be done every 1-2 years. Talk to your health care provider about how often you should have regular mammograms. Talk with your health care provider about your test results, treatment options, and if necessary, the need for more tests. Vaccines  Your health care provider may recommend certain vaccines, such as:  Influenza vaccine. This is recommended every  year.  Tetanus, diphtheria, and acellular pertussis (Tdap, Td) vaccine. You may need a Td booster every 10 years.  Zoster vaccine. You may need this after age 37.  Pneumococcal 13-valent conjugate (PCV13) vaccine. One dose  is recommended after age 67.  Pneumococcal polysaccharide (PPSV23) vaccine. One dose is recommended after age 71. Talk to your health care provider about which screenings and vaccines you need and how often you need them. This information is not intended to replace advice given to you by your health care provider. Make sure you discuss any questions you have with your health care provider. Document Released: 07/07/2015 Document Revised: 02/28/2016 Document Reviewed: 04/11/2015 Elsevier Interactive Patient Education  2017 [employer] Prevention in the Home Falls can cause injuries. They can happen to people of all ages. There are many things you can do to make your home safe and to help prevent falls. What can I do on the outside of my home?  Regularly fix the edges of walkways and driveways and fix any cracks.  Remove anything that might make you trip as you walk through a door, such as a raised step or threshold.  Trim any bushes or trees on the path to your home.  Use bright outdoor lighting.  Clear any walking paths of anything that might make someone trip, such as rocks or tools.  Regularly check to see if handrails are loose or broken. Make sure that both sides of any steps have handrails.  Any raised decks and porches should have guardrails on the edges.  Have any leaves, snow, or ice cleared regularly.  Use sand or salt on walking paths during winter.  Clean up any spills in your garage right away. This includes oil or grease spills. What can I do in the bathroom?  Use night lights.  Install grab bars by the toilet and in the tub and shower. Do not use towel bars as grab bars.  Use non-skid mats or decals in the tub or shower.  If you need to sit down in the shower, use a plastic, non-slip stool.  Keep the floor dry. Clean up any water that spills on the floor as soon as it happens.  Remove soap buildup in the tub or shower regularly.  Attach bath mats  securely with double-sided non-slip rug tape.  Do not have throw rugs and other things on the floor that can make you trip. What can I do in the bedroom?  Use night lights.  Make sure that you have a light by your bed that is easy to reach.  Do not use any sheets or blankets that are too big for your bed. They should not hang down onto the floor.  Have a firm chair that has side arms. You can use this for support while you get dressed.  Do not have throw rugs and other things on the floor that can make you trip. What can I do in the kitchen?  Clean up any spills right away.  Avoid walking on wet floors.  Keep items that you use a lot in easy-to-reach places.  If you need to reach something above you, use a strong step stool that has a grab bar.  Keep electrical cords out of the way.  Do not use floor polish or wax that makes floors slippery. If you must use wax, use non-skid floor wax.  Do not have throw rugs and other things on the floor that can make you trip. What can  I do with my stairs?  Do not leave any items on the stairs.  Make sure that there are handrails on both sides of the stairs and use them. Fix handrails that are broken or loose. Make sure that handrails are as long as the stairways.  Check any carpeting to make sure that it is firmly attached to the stairs. Fix any carpet that is loose or worn.  Avoid having throw rugs at the top or bottom of the stairs. If you do have throw rugs, attach them to the floor with carpet tape.  Make sure that you have a light switch at the top of the stairs and the bottom of the stairs. If you do not have them, ask someone to add them for you. What else can I do to help prevent falls?  Wear shoes that:  Do not have high heels.  Have rubber bottoms.  Are comfortable and fit you well.  Are closed at the toe. Do not wear sandals.  If you use a stepladder:  Make sure that it is fully opened. Do not climb a closed  stepladder.  Make sure that both sides of the stepladder are locked into place.  Ask someone to hold it for you, if possible.  Clearly mark and make sure that you can see:  Any grab bars or handrails.  First and last steps.  Where the edge of each step is.  Use tools that help you move around (mobility aids) if they are needed. These include:  Canes.  Walkers.  Scooters.  Crutches.  Turn on the lights when you go into a dark area. Replace any light bulbs as soon as they burn out.  Set up your furniture so you have a clear path. Avoid moving your furniture around.  If any of your floors are uneven, fix them.  If there are any pets around you, be aware of where they are.  Review your medicines with your doctor. Some medicines can make you feel dizzy. This can increase your chance of falling. Ask your doctor what other things that you can do to help prevent falls. This information is not intended to replace advice given to you by your health care provider. Make sure you discuss any questions you have with your health care provider. Document Released: 04/06/2009 Document Revised: 11/16/2015 Document Reviewed: 07/15/2014 Elsevier Interactive Patient Education  2017 [employer].

## 2018-07-10 LAB — BMP8+EGFR
BUN / CREAT RATIO: 15 (ref 12–28)
BUN: 11 mg/dL (ref 8–27)
CHLORIDE: 101 mmol/L (ref 96–106)
CO2: 26 mmol/L (ref 20–29)
Calcium: 10.6 mg/dL — ABNORMAL HIGH (ref 8.7–10.3)
Creatinine, Ser: 0.75 mg/dL (ref 0.57–1.00)
GFR calc non Af Amer: 80 mL/min/{1.73_m2} (ref >59)
GFR, EST AFRICAN AMERICAN: 92 mL/min/{1.73_m2} (ref >59)
Glucose: 95 mg/dL (ref 65–99)
Potassium: 4.1 mmol/L (ref 3.5–5.2)
Sodium: 140 mmol/L (ref 134–144)

## 2018-07-10 LAB — LIPID PANEL
CHOL/HDL RATIO: 2.9 ratio (ref 0.0–4.4)
Cholesterol, Total: 185 mg/dL (ref 100–199)
HDL: 64 mg/dL (ref >39)
LDL CALC: 105 mg/dL — AB (ref 0–99)
TRIGLYCERIDES: 82 mg/dL (ref 0–149)
VLDL Cholesterol Cal: 16 mg/dL (ref 5–40)

## 2018-07-10 LAB — HEMOGLOBIN A1C
Est. average glucose Bld gHb Est-mCnc: 123 mg/dL
Hgb A1c MFr Bld: 5.9 % — ABNORMAL HIGH (ref 4.8–5.6)

## 2018-07-12 ENCOUNTER — Encounter: Payer: Self-pay | Admitting: Internal Medicine

## 2018-07-12 ENCOUNTER — Other Ambulatory Visit: Payer: Self-pay | Admitting: Internal Medicine

## 2018-07-12 NOTE — Progress Notes (Signed)
Subjective:     Patient ID: Cynthia Barton , female    DOB: Apr 10, 1946 , 73 y.o.   MRN: [id number]   Chief Complaint  Patient presents with  . Hypertension    HPI  Hypertension  This is a chronic problem. The current episode started more than 1 year ago. The problem has been gradually improving since onset. The problem is controlled. Pertinent negatives include no blurred vision, chest pain, palpitations or shortness of breath. Risk factors for coronary artery disease include dyslipidemia, post-menopausal state, obesity and sedentary lifestyle.   She reports compliance with meds.   Past Medical History:  Diagnosis Date  . Breast cancer ([place]) 09/07/2014   ER+/PR+/Her2- right breast  . Breast cancer of lower-outer quadrant of right female breast ([place]) 09/06/2014  . Hyperlipidemia   . Hypertension   . Radiation 11/10/14-12/09/14   Right Breast  . Uterine prolapse 2016  . Wears dentures    top  . Wears glasses      Family History  Problem Relation Age of Onset  . Heart disease Father   . Melanoma Mother   . Kidney failure Sister 54  . Heart disease Maternal Uncle      Current Outpatient Medications:  .  anastrozole (ARIMIDEX) 1 MG tablet, Take 1 tablet (1 mg total) by mouth daily., Disp: 90 tablet, Rfl: 3 .  hydrochlorothiazide (MICROZIDE) 12.5 MG capsule, TAKE 1 CAPSULE BY MOUTH ONCE DAILY (Patient not taking: Reported on 07/09/2018), Disp: 90 capsule, Rfl: 1 .  lisinopril (PRINIVIL,ZESTRIL) 10 MG tablet, TAKE 1 TABLET BY MOUTH ONCE DAILY, Disp: 90 tablet, Rfl: 0 .  Multiple Vitamins-Minerals (MULTIVITAMIN WITH MINERALS) tablet, Take 1 tablet by mouth daily., Disp: , Rfl:  .  Pitavastatin Calcium (LIVALO) 2 MG TABS, Take by mouth. Takes on Monday Wednesday and Friday, Disp: , Rfl:  .  triamcinolone cream (KENALOG) 0.1 %, Apply 1 application topically 2 (two) times daily. PRN, Disp: 45 g, Rfl: 0   No Known Allergies   Review of Systems  Constitutional: Negative.   Eyes:  Negative for blurred vision.  Respiratory: Negative.  Negative for shortness of breath.   Cardiovascular: Negative.  Negative for chest pain and palpitations.  Gastrointestinal: Negative.   Musculoskeletal: Positive for arthralgias (she c/o b/l hand pain. she c/o stiffness upon awakening. this gradually improves as the day progresses. unable to identify add'l triggers. ).  Neurological: Negative.   Psychiatric/Behavioral: Negative.      Today's Vitals   07/09/18 0954  BP: (!) 142/82  Pulse: 71  Temp: 98.2 F (36.8 C)  TempSrc: Oral  Weight: 187 lb 12.8 oz (85.2 kg)  Height: 4' 11.5" (1.511 m)  PainSc: 0-No pain   Body mass index is 37.3 kg/m.   Objective:  Physical Exam Vitals signs and nursing note reviewed.  Constitutional:      Appearance: Normal appearance. She is obese.  HENT:     Head: Normocephalic and atraumatic.  Cardiovascular:     Rate and Rhythm: Normal rate and regular rhythm.     Heart sounds: Normal heart sounds.  Pulmonary:     Effort: Pulmonary effort is normal.     Breath sounds: Normal breath sounds.  Skin:    General: Skin is warm.  Neurological:     Mental Status: She is alert.  Psychiatric:        Mood and Affect: Mood normal.         Assessment And Plan:     1. Benign hypertension with  CKD (chronic kidney disease), stage II  Fair control. She will continue with current meds. She is encouraged to avoid adding salt to her foods. Also advised goal bp is less than 130/80.   - BMP8+EGFR - Lipid Profile - POCT Urinalysis Dipstick (81002) - POCT UA - Microalbumin  2. Metabolic syndrome  HER W6O HAS BEEN ELEVATED IN THE PAST. I WILL CHECK AN A1C, BMET TODAY. SHE WAS ENCOURAGED TO AVOID SUGARY BEVERAGES AND PROCESSED FOODS INCLUDNG BREADS, RICE AND PASTA.  - Hemoglobin A1c  3. Bilateral hand pain  Negative squeeze test. Her sx are likely due to osteoarthritis. She will try topical pain cream twice daily prn. She will let me know if her sx  persist.   4. Class 2 severe obesity due to excess calories with serious comorbidity and body mass index (BMI) of 37.0 to 37.9 in adult [facility])  She is encouraged to strive for BMI less than 30 to decrease cardiac risk. She is advised to incorporate more activity into her daily routine. 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 She agrees to give this a try.  [name], MD

## 2018-09-11 ENCOUNTER — Other Ambulatory Visit: Payer: Self-pay | Admitting: Internal Medicine

## 2018-10-19 NOTE — Assessment & Plan Note (Signed)
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 29%, HER-2 negative ratio 1, Ki-67 21%, T1 cN0 M0 stage IA Oncotype Dx 17 (Low Risk) S/P XRT  Current treatment: Antiestrogen therapy once daily for 5 years started 12/23/2014   Anastrozole toxicities: 1. Occasional hot flashes 2. Muscle aches and pains  Breast cancer surveillance:  1. Breast exam05/06/2017: No palpable nodules or concerns 2. Mammogram 03/08/2019at [facility]: Postsurgical changes breast density category B 3. Bone density 01/10/2015: T score -1.3 mild osteopenia: Continue with calcium and vitamin D   Return to clinic in1 yearfor follow-up.

## 2018-10-21 ENCOUNTER — Telehealth: Payer: Self-pay | Admitting: Hematology and Oncology

## 2018-10-21 NOTE — Telephone Encounter (Signed)
Called regarding upcoming Webex appointment, left patient a voicemail and e-mail has been sent.  °

## 2018-10-22 NOTE — Progress Notes (Signed)
HEMATOLOGY-ONCOLOGY [place] VISIT PROGRESS NOTE  I connected with Cynthia Barton on 10/24/8411 at 10:00 AM EDT by Webex video conference and verified that I am speaking with the correct person using two identifiers.  I discussed the limitations, risks, security and privacy concerns of performing an evaluation and management service by Webex and the availability of in person appointments.  I also discussed with the patient that there may be a patient responsible charge related to this service. The patient expressed understanding and agreed to proceed.   Patient's Location: Home Physician Location: Clinic  CHIEF COMPLIANT: Follow-up on anastrozole therapy  INTERVAL HISTORY: Cynthia Barton is a 73 y.o. female with above-mentioned history of right breast cancer treated with lumpectomy and radiation who is currently on anti-estrogen therapy with anastrozole. I last saw her a year ago. She presents today over Webex for annual follow-up.     Breast cancer of lower-outer quadrant of right female breast ([place])   08/31/2014 Mammogram    Right breast: irregular mass with spiculated margin and heterogenous calcifications central to the nipple, middle depth    08/31/2014 Breast US    Right breast: 1.5 cm irregular mass, spiculared margin, right breast LOQ, middle depth, hypoechoic with posterior acoustic shadowing    09/01/2014 Initial Biopsy    Right breast core needle bx: invasive ductal carcinoma with DCIS, grade 2, ER+ (100%), PR+ (29%), HER-2 negative (ratio 1.00), Ki-67 21%,     09/08/2014 Breast MRI    Right breast: There is a spiculated enhancing mass with washout kinetics in the lateral right breast at the level of the nipple mid to posterior third depth measuring 1.7 x 1.8 x 1.7 cm with associated clip    09/08/2014 Clinical Stage    Stage IA: T1c N0    09/30/2014 Definitive Surgery    Right lumpectomy/[facility] Marlou Starks): Invasive ductal carcinoma 1.8 cm with DCIS ER+ (100%), PR+ (29%), HER-2 repeated and  remains negative (ratio 1.00), Ki-67 21%. 1 N removed and negative for malignancy (0/1).    09/30/2014 Pathologic Stage    Stage IA: pT1c pN0     09/30/2014 Oncotype testing    Score: 17 (11% ROR). No chemotherapy.    11/10/2014 - 12/09/2014 Radiation Therapy    Adjuvant RT Pablo Ledger): Right breast/ 42.72 Gy over 21 fractions. Right breast boost/ 10 Gy over 5 fractions    12/23/2014 -  Anti-estrogen oral therapy    Anastrozole 1 mg daily. Planned duration of therapy 5-10 years.    05/09/2015 Survivorship    Survivorship visit completed and copy of care plan provided to patient.     REVIEW OF SYSTEMS:   Constitutional: Denies fevers, chills or abnormal weight loss Eyes: Denies blurriness of vision Ears, nose, mouth, throat, and face: Denies mucositis or sore throat Respiratory: Denies cough, dyspnea or wheezes Cardiovascular: Denies palpitation, chest discomfort Gastrointestinal:  Denies nausea, heartburn or change in bowel habits Skin: Denies abnormal skin rashes Lymphatics: Denies new lymphadenopathy or easy bruising Neurological:Denies numbness, tingling or new weaknesses Behavioral/Psych: Mood is stable, no new changes  Extremities: No lower extremity edema Breast: denies any pain or lumps or nodules in either breasts All other systems were reviewed with the patient and are negative.  Observations/Objective:  There were no vitals filed for this visit. There is no height or weight on file to calculate BMI.  I have reviewed the data as listed CMP Latest Ref Rng & Units 07/09/2018 05/01/2018 04/23/2018  Glucose 65 - 99 mg/dL 95 - 92  BUN 8 -  27 mg/dL 11 - 14  Creatinine 0.57 - 1.00 mg/dL 0.75 - 0.96  Sodium 134 - 144 mmol/L 140 - 140  Potassium 3.5 - 5.2 mmol/L 4.1 - 4.6  Chloride 96 - 106 mmol/L 101 - 102  CO2 20 - 29 mmol/L 26 - 23  Calcium 8.7 - 10.3 mg/dL 10.6(H) 10.5(H) 11.4(H)  Total Protein 6.0 - 8.5 g/dL - 6.6 7.2  Total Bilirubin 0.0 - 1.2 mg/dL - - 0.5  Alkaline  Phos 39 - 117 IU/L - - 73  AST 0 - 40 IU/L - - 28  ALT 0 - 32 IU/L - - 21    Lab Results  Component Value Date   WBC 6.6 04/02/2017   HGB 14.0 04/02/2017   HCT 40.8 04/02/2017   MCV 85.2 04/02/2017   PLT 208 04/02/2017   NEUTROABS 4.5 04/02/2017      Assessment Plan:  Breast cancer of lower-outer quadrant of right female breast Right lumpectomy 09/30/2014: 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 29%, HER-2 negative ratio 1, Ki-67 21%, T1 cN0 M0 stage IA Oncotype Dx 17 (Low Risk) S/P XRT  Current treatment: Antiestrogen therapy once daily for 5 years started 12/23/2014   Anastrozole toxicities: 1. Occasional hot flashes 2. Muscle aches and pains  Breast cancer surveillance:  1. Breast exam05/06/2017: No palpable nodules or concerns 2. Mammogram 03/08/2019at [facility]: Postsurgical changes breast density category B 3. Bone density 01/10/2015: T score -1.3 mild osteopenia: Continue with calcium and vitamin D   Return to clinic in1 yearfor follow-up.  I discussed the assessment and treatment plan with the patient. The patient was provided an opportunity to ask questions and all were answered. The patient agreed with the plan and demonstrated an understanding of the instructions. The patient was advised to call back or seek an in-person evaluation if the symptoms worsen or if the condition fails to improve as anticipated.   I provided 15 minutes of face-to-face Web Ex time during this encounter.    Rulon Eisenmenger, MD 10/23/2018   I, Molly Dorshimer, am acting as scribe for Nicholas Lose, MD.  I have reviewed the above documentation for accuracy and completeness, and I agree with the above.

## 2018-10-23 ENCOUNTER — Inpatient Hospital Stay: Payer: Medicare Other | Attending: Hematology and Oncology | Admitting: Hematology and Oncology

## 2018-10-23 DIAGNOSIS — C50511 Malignant neoplasm of lower-outer quadrant of right female breast: Secondary | ICD-10-CM | POA: Diagnosis not present

## 2018-10-23 DIAGNOSIS — Z170 Estrogen receptor positive status [ER+]: Secondary | ICD-10-CM

## 2018-10-23 MED ORDER — ANASTROZOLE 1 MG PO TABS: 1.0000 mg | ORAL_TABLET | Freq: Every day | ORAL | 3 refills | Status: DC

## 2018-11-17 ENCOUNTER — Other Ambulatory Visit: Payer: Self-pay | Admitting: Internal Medicine

## 2018-12-21 ENCOUNTER — Other Ambulatory Visit: Payer: Self-pay | Admitting: Internal Medicine

## 2018-12-28 ENCOUNTER — Other Ambulatory Visit: Payer: Self-pay

## 2018-12-28 DIAGNOSIS — N182 Chronic kidney disease, stage 2 (mild): Secondary | ICD-10-CM

## 2018-12-28 DIAGNOSIS — I129 Hypertensive chronic kidney disease with stage 1 through stage 4 chronic kidney disease, or unspecified chronic kidney disease: Secondary | ICD-10-CM

## 2018-12-28 MED ORDER — LIVALO 2 MG PO TABS: 2.0000 mg | ORAL_TABLET | ORAL | 3 refills | Status: DC

## 2019-01-06 ENCOUNTER — Ambulatory Visit: Payer: Self-pay

## 2019-01-06 DIAGNOSIS — I129 Hypertensive chronic kidney disease with stage 1 through stage 4 chronic kidney disease, or unspecified chronic kidney disease: Secondary | ICD-10-CM

## 2019-01-06 DIAGNOSIS — N182 Chronic kidney disease, stage 2 (mild): Secondary | ICD-10-CM

## 2019-01-07 ENCOUNTER — Ambulatory Visit: Payer: Self-pay

## 2019-01-07 DIAGNOSIS — N182 Chronic kidney disease, stage 2 (mild): Secondary | ICD-10-CM

## 2019-01-07 DIAGNOSIS — I129 Hypertensive chronic kidney disease with stage 1 through stage 4 chronic kidney disease, or unspecified chronic kidney disease: Secondary | ICD-10-CM

## 2019-01-07 NOTE — Chronic Care Management (AMB) (Signed)
  Chronic Care Management   Outreach Note  0/15/6153 Name: Cynthia Barton MRN: [id number] DOB: 08/08/1945  Referred by: Glendale Chard, MD Reason for referral : Care Coordination   An unsuccessful outreach attempt was placed to the patient to follow up on information obtained from embedded PharmD.   Follow Up Plan: A HIPPA compliant phone message was left for the patient providing contact information and requesting a return call.  The care management team will reach out to the patient again over the next 10 days.   Daneen Schick, BSW, CDP Social Worker, Certified Dementia Practitioner [place] [facility] [employer] [facility] [phone]

## 2019-01-07 NOTE — Chronic Care Management (AMB) (Signed)
  Care Management Note   Cynthia Barton is a 73 y.o. year old female who is a primary care patient of Glendale Chard, MD . The CM team was consulted for assistance with medication assistance related to Livalo prescription.   Review of patient status, including review of consultants reports, rand collaboration with appropriate care team members and the patient's provider was performed as part of comprehensive patient evaluation and provision of care management services. Telephone outreach to patient today to introduce CM services.   I reached out to [employer] by phone today.   [name] was given information about Chronic Care Management services today including:  1. CCM service includes personalized support from designated clinical staff supervised by her physician, including individualized plan of care and coordination with other care providers 2. 24/7 contact phone numbers for assistance for urgent and routine care needs. 3. Service will only be billed when office clinical staff spend 20 minutes or more in a month to coordinate care. 4. Only one practitioner may furnish and bill the service in a calendar month. 5. The patient may stop CCM services at any time (effective at the end of the month) by phone call to the office staff. 6. The patient will be responsible for cost sharing (co-pay) of up to 20% of the service fee (after annual deductible is met).   Patient did not agree to services and wishes to consider information provided before deciding about enrollment in care management services. The patient is not interested in consenting to services unless she knows she will be able to receive help with medication assistance. CCM SW explained this Probation officer would reach out to embedded PharmD to gather more information and contact the patient at a later date.  CCM SW collaborated with PharmD Lottie Dawson who indicated qualifying for Livalo patient assistance can be difficult. CCM SW was informed the  patient income must be less than 18,735 per year to qualify for the program. CCM SW will outreach the patient to screen for income prior to enrolling in services.   Follow Up Plan: SW will follow up with patient by phone over the next 3 days.  Daneen Schick, BSW, CDP Social Worker, Certified Dementia Practitioner [place] [facility] [place] [facility] [phone]

## 2019-01-08 ENCOUNTER — Telehealth: Payer: Self-pay

## 2019-01-12 ENCOUNTER — Other Ambulatory Visit: Payer: Self-pay

## 2019-01-12 ENCOUNTER — Encounter: Payer: Self-pay | Admitting: Internal Medicine

## 2019-01-12 ENCOUNTER — Ambulatory Visit (INDEPENDENT_AMBULATORY_CARE_PROVIDER_SITE_OTHER): Payer: Medicare Other | Admitting: Internal Medicine

## 2019-01-12 VITALS — BP 128/78 | HR 81 | Temp 98.3°F | Ht 58.6 in | Wt 195.0 lb

## 2019-01-12 DIAGNOSIS — N182 Chronic kidney disease, stage 2 (mild): Secondary | ICD-10-CM | POA: Diagnosis not present

## 2019-01-12 DIAGNOSIS — H9193 Unspecified hearing loss, bilateral: Secondary | ICD-10-CM

## 2019-01-12 DIAGNOSIS — M79641 Pain in right hand: Secondary | ICD-10-CM | POA: Diagnosis not present

## 2019-01-12 DIAGNOSIS — Z0000 Encounter for general adult medical examination without abnormal findings: Secondary | ICD-10-CM

## 2019-01-12 DIAGNOSIS — E7800 Pure hypercholesterolemia, unspecified: Secondary | ICD-10-CM

## 2019-01-12 DIAGNOSIS — M79642 Pain in left hand: Secondary | ICD-10-CM

## 2019-01-12 DIAGNOSIS — Z6839 Body mass index (BMI) 39.0-39.9, adult: Secondary | ICD-10-CM

## 2019-01-12 DIAGNOSIS — I129 Hypertensive chronic kidney disease with stage 1 through stage 4 chronic kidney disease, or unspecified chronic kidney disease: Secondary | ICD-10-CM | POA: Diagnosis not present

## 2019-01-12 DIAGNOSIS — R7309 Other abnormal glucose: Secondary | ICD-10-CM | POA: Diagnosis not present

## 2019-01-12 DIAGNOSIS — E66812 Obesity, class 2: Secondary | ICD-10-CM

## 2019-01-12 LAB — POCT URINALYSIS DIPSTICK
Bilirubin, UA: NEGATIVE
Blood, UA: NEGATIVE
Glucose, UA: NEGATIVE
Ketones, UA: NEGATIVE
Nitrite, UA: NEGATIVE
Protein, UA: NEGATIVE
Spec Grav, UA: 1.01 (ref 1.010–1.025)
Urobilinogen, UA: 0.2 E.U./dL
pH, UA: 6 (ref 5.0–8.0)

## 2019-01-12 LAB — POCT UA - MICROALBUMIN
Albumin/Creatinine Ratio, Urine, POC: <30
Creatinine, POC: 10 mg/dL
Microalbumin Ur, POC: 10 mg/L

## 2019-01-12 NOTE — Patient Instructions (Signed)
Health Maintenance, Female Adopting a healthy lifestyle and getting preventive care are important in promoting health and wellness. Ask your health care provider about:  The right schedule for you to have regular tests and exams.  Things you can do on your own to prevent diseases and keep yourself healthy. What should I know about diet, weight, and exercise? Eat a healthy diet   Eat a diet that includes plenty of vegetables, fruits, low-fat dairy products, and lean protein.  Do not eat a lot of foods that are high in solid fats, added sugars, or sodium. Maintain a healthy weight Body mass index (BMI) is used to identify weight problems. It estimates body fat based on height and weight. Your health care provider can help determine your BMI and help you achieve or maintain a healthy weight. Get regular exercise Get regular exercise. This is one of the most important things you can do for your health. Most adults should:  Exercise for at least 150 minutes each week. The exercise should increase your heart rate and make you sweat (moderate-intensity exercise).  Do strengthening exercises at least twice a week. This is in addition to the moderate-intensity exercise.  Spend less time sitting. Even light physical activity can be beneficial. Watch cholesterol and blood lipids Have your blood tested for lipids and cholesterol at 73 years of age, then have this test every 5 years. Have your cholesterol levels checked more often if:  Your lipid or cholesterol levels are high.  You are older than 73 years of age.  You are at high risk for heart disease. What should I know about cancer screening? Depending on your health history and family history, you may need to have cancer screening at various ages. This may include screening for:  Breast cancer.  Cervical cancer.  Colorectal cancer.  Skin cancer.  Lung cancer. What should I know about heart disease, diabetes, and high blood  pressure? Blood pressure and heart disease  High blood pressure causes heart disease and increases the risk of stroke. This is more likely to develop in people who have high blood pressure readings, are of African descent, or are overweight.  Have your blood pressure checked: ? Every 3-5 years if you are 18-39 years of age. ? Every year if you are 40 years old or older. Diabetes Have regular diabetes screenings. This checks your fasting blood sugar level. Have the screening done:  Once every three years after age 40 if you are at a normal weight and have a low risk for diabetes.  More often and at a younger age if you are overweight or have a high risk for diabetes. What should I know about preventing infection? Hepatitis B If you have a higher risk for hepatitis B, you should be screened for this virus. Talk with your health care provider to find out if you are at risk for hepatitis B infection. Hepatitis C Testing is recommended for:  Everyone born from 1945 through 1965.  Anyone with known risk factors for hepatitis C. Sexually transmitted infections (STIs)  Get screened for STIs, including gonorrhea and chlamydia, if: ? You are sexually active and are younger than 73 years of age. ? You are older than 73 years of age and your health care provider tells you that you are at risk for this type of infection. ? Your sexual activity has changed since you were last screened, and you are at increased risk for chlamydia or gonorrhea. Ask your health care provider if   you are at risk.  Ask your health care provider about whether you are at high risk for HIV. Your health care provider may recommend a prescription medicine to help prevent HIV infection. If you choose to take medicine to prevent HIV, you should first get tested for HIV. You should then be tested every 3 months for as long as you are taking the medicine. Pregnancy  If you are about to stop having your period (premenopausal) and  you may become pregnant, seek counseling before you get pregnant.  Take 400 to 800 micrograms (mcg) of folic acid every day if you become pregnant.  Ask for birth control (contraception) if you want to prevent pregnancy. Osteoporosis and menopause Osteoporosis is a disease in which the bones lose minerals and strength with aging. This can result in bone fractures. If you are 65 years old or older, or if you are at risk for osteoporosis and fractures, ask your health care provider if you should:  Be screened for bone loss.  Take a calcium or vitamin D supplement to lower your risk of fractures.  Be given hormone replacement therapy (HRT) to treat symptoms of menopause. Follow these instructions at home: Lifestyle  Do not use any products that contain nicotine or tobacco, such as cigarettes, e-cigarettes, and chewing tobacco. If you need help quitting, ask your health care provider.  Do not use street drugs.  Do not share needles.  Ask your health care provider for help if you need support or information about quitting drugs. Alcohol use  Do not drink alcohol if: ? Your health care provider tells you not to drink. ? You are pregnant, may be pregnant, or are planning to become pregnant.  If you drink alcohol: ? Limit how much you use to 0-1 drink a day. ? Limit intake if you are breastfeeding.  Be aware of how much alcohol is in your drink. In the [place] one drink equals one 12 oz bottle of beer (355 mL), one 5 oz glass of wine (148 mL), or one 1 oz glass of hard liquor (44 mL). General instructions  Schedule regular health, dental, and eye exams.  Stay current with your vaccines.  Tell your health care provider if: ? You often feel depressed. ? You have ever been abused or do not feel safe at home. Summary  Adopting a healthy lifestyle and getting preventive care are important in promoting health and wellness.  Follow your health care provider's instructions about healthy  diet, exercising, and getting tested or screened for diseases.  Follow your health care provider's instructions on monitoring your cholesterol and blood pressure. This information is not intended to replace advice given to you by your health care provider. Make sure you discuss any questions you have with your health care provider. Document Released: 12/24/2010 Document Revised: 06/03/2018 Document Reviewed: 06/03/2018 Elsevier Patient Education  2020 Elsevier Inc.  

## 2019-01-12 NOTE — Progress Notes (Signed)
Subjective:     Patient ID: Cynthia Barton , female    DOB: 26-Nov-1945 , 73 y.o.   MRN: [id number]   Chief Complaint  Patient presents with  . Annual Exam  . Hypertension    HPI  She is here today for a full physical examination. She is no longer followed by GYN.   Hypertension This is a chronic problem. The current episode started more than 1 year ago. The problem has been gradually improving since onset. The problem is controlled. Pertinent negatives include no blurred vision, chest pain, palpitations or shortness of breath. Risk factors for coronary artery disease include obesity, post-menopausal state and sedentary lifestyle. Past treatments include diuretics. The current treatment provides moderate improvement. Hypertensive end-organ damage includes kidney disease.     Past Medical History:  Diagnosis Date  . Breast cancer ([facility]) 09/07/2014   ER+/PR+/Her2- right breast  . Breast cancer of lower-outer quadrant of right female breast ([facility]) 09/06/2014  . Hyperlipidemia   . Hypertension   . Radiation 11/10/14-12/09/14   Right Breast  . Uterine prolapse 2016  . Wears dentures    top  . Wears glasses      Family History  Problem Relation Age of Onset  . Heart disease Father   . Melanoma Mother   . Kidney failure Sister 43  . Heart disease Maternal Uncle      Current Outpatient Medications:  .  anastrozole (ARIMIDEX) 1 MG tablet, Take 1 tablet (1 mg total) by mouth daily., Disp: 90 tablet, Rfl: 3 .  hydrochlorothiazide (MICROZIDE) 12.5 MG capsule, Take 1 capsule by mouth once daily, Disp: 90 capsule, Rfl: 0 .  lisinopril (ZESTRIL) 10 MG tablet, Take 1 tablet by mouth once daily, Disp: 90 tablet, Rfl: 0 .  Multiple Vitamins-Minerals (MULTIVITAMIN WITH MINERALS) tablet, Take 1 tablet by mouth daily., Disp: , Rfl:  .  Pitavastatin Calcium (LIVALO) 2 MG TABS, Take 1 tablet (2 mg total) by mouth 3 (three) times a week. Takes on Monday Wednesday and Friday, Disp: 36 tablet, Rfl: 3    No Known Allergies    The patient states she uses nothing for birth control, she is postmenopausal. Last LMP was No LMP recorded. Patient is postmenopausal..  Negative for: breast discharge, breast lump(s), breast pain and breast self exam. Associated symptoms include abnormal vaginal bleeding. Pertinent negatives include abnormal bleeding (hematology), anxiety, decreased libido, depression, difficulty falling sleep, dyspareunia, history of infertility, nocturia, sexual dysfunction, sleep disturbances, urinary incontinence, urinary urgency, vaginal discharge and vaginal itching. Diet regular.The patient states her exercise level is  minimal.   . The patient's tobacco use is:  Social History   Tobacco Use  Smoking Status Never Smoker  Smokeless Tobacco Never Used  . She has been exposed to passive smoke. The patient's alcohol use is:  Social History   Substance and Sexual Activity  Alcohol Use Not Currently  . Alcohol/week: 0.0 standard drinks    Review of Systems  Constitutional: Negative.   HENT: Positive for hearing loss.        She wants to have her hearing checked today. States her family says she can't hear.   Eyes: Negative.  Negative for blurred vision.  Respiratory: Negative.  Negative for shortness of breath.   Cardiovascular: Negative.  Negative for chest pain and palpitations.  Gastrointestinal: Negative.   Endocrine: Negative.   Genitourinary: Negative.   Musculoskeletal: Positive for arthralgias.       She c/o b/l hand pain. Reports her hands ache throughout  the day. Sx are worse at night. Denies trauma/fall. Does not take any OTC meds, states they are ineffective.   Skin: Negative.   Allergic/Immunologic: Negative.   Neurological: Negative.   Hematological: Negative.   Psychiatric/Behavioral: Negative.      Today's Vitals   01/12/19 0923  BP: 128/78  Pulse: 81  Temp: 98.3 F (36.8 C)  TempSrc: Oral  SpO2: 100%  Weight: 195 lb (88.5 kg)  Height: 4' 10.6"  (1.488 m)   Body mass index is 39.92 kg/m.   Objective:  Physical Exam Vitals signs and nursing note reviewed.  Constitutional:      Appearance: Normal appearance.  HENT:     Head: Normocephalic and atraumatic.     Right Ear: Tympanic membrane, ear canal and external ear normal.     Left Ear: Tympanic membrane, ear canal and external ear normal.     Nose: Nose normal.     Mouth/Throat:     Mouth: Mucous membranes are moist.     Pharynx: Oropharynx is clear.  Eyes:     Extraocular Movements: Extraocular movements intact.     Conjunctiva/sclera: Conjunctivae normal.     Pupils: Pupils are equal, round, and reactive to light.  Neck:     Musculoskeletal: Normal range of motion and neck supple.  Cardiovascular:     Rate and Rhythm: Normal rate and regular rhythm.     Pulses: Normal pulses.     Heart sounds: Normal heart sounds.  Pulmonary:     Effort: Pulmonary effort is normal.     Breath sounds: Normal breath sounds.  Chest:     Breasts: Tanner Score is 5.        Right: Normal. No swelling, bleeding, inverted nipple, mass, nipple discharge or skin change.        Left: Normal. No swelling, bleeding, inverted nipple, mass, nipple discharge or skin change.  Abdominal:     General: Abdomen is flat. Bowel sounds are normal.     Palpations: Abdomen is soft.  Genitourinary:    Comments: deferred Musculoskeletal: Normal range of motion.     Right hand: She exhibits bony tenderness.     Left hand: She exhibits bony tenderness.     Comments: Heberden's nodes present b/l  Skin:    General: Skin is warm and dry.  Neurological:     General: No focal deficit present.     Mental Status: She is alert and oriented to person, place, and time.  Psychiatric:        Mood and Affect: Mood normal.        Behavior: Behavior normal.         Assessment And Plan:     1. Routine general medical examination at health care facility  A full exam was performed. Importance of monthly self  breast exams was discussed with the patient. PATIENT HAS BEEN ADVISED TO GET 30-45 MINUTES REGULAR EXERCISE NO LESS THAN FOUR TO FIVE DAYS PER WEEK - BOTH WEIGHTBEARING EXERCISES AND AEROBIC ARE RECOMMENDED.  SHE IS ADVISED TO FOLLOW A HEALTHY DIET WITH AT LEAST SIX FRUITS/VEGGIES PER DAY, DECREASE INTAKE OF RED MEAT, AND TO INCREASE FISH INTAKE TO TWO DAYS PER WEEK.  MEATS/FISH SHOULD NOT BE FRIED, BAKED OR BROILED IS PREFERABLE.  I SUGGEST WEARING SPF 50 SUNSCREEN ON EXPOSED PARTS AND ESPECIALLY WHEN IN THE DIRECT SUNLIGHT FOR AN EXTENDED PERIOD OF TIME.  PLEASE AVOID FAST FOOD RESTAURANTS AND INCREASE YOUR WATER INTAKE.  - POCT Urinalysis Dipstick (78588) -  POCT UA - Microalbumin - EKG 12-Lead  2. Benign hypertension with CKD (chronic kidney disease), stage II  Chronic, well controlled. She will continue with current meds. She is encouraged to avoid adding salt to her foods.   - POCT Urinalysis Dipstick (81002) - POCT UA - Microalbumin - EKG 12-Lead - CMP14+EGFR - CBC - Lipid panel  3. Chronic renal disease, stage II  Chronic. I will check a GFR, CR today. She is encouraged to stay well hydrated.   4. Other abnormal glucose  HER A1C HAS BEEN ELEVATED IN THE PAST. I WILL CHECK AN A1C, BMET TODAY. SHE WAS ENCOURAGED TO AVOID SUGARY BEVERAGES AND PROCESSED FOODS INCLUDNG BREADS, RICE AND PASTA.  - Hemoglobin A1c  5. Bilateral hearing loss, unspecified hearing loss type  Audiometry performed. She is in need of formal audiology testing. She is in agreement with her treatment plan.   6. Pure hypercholesterolemia  She is currently taking Livalo; however, she states it is now too expensive. She is interested in switching to another agent with MWF dosing.   7. Bilateral hand pain  I will check an arthritis panel. She has requested referral to specialist as well. It is likely her sx are due to mod-severe osteoarthritis.   - ANA, IFA (with reflex) - CYCLIC CITRUL PEPTIDE ANTIBODY,  IGG/IGA - Rheumatoid factor - Sedimentation rate - Uric acid  8. Class 2 severe obesity due to excess calories with serious comorbidity and body mass index (BMI) of 39.0 to 39.9 in adult [facility])  She is encouraged to strive for BMI less than 34 to decrease cardiac risk. She is encouraged to increase activity as tolerated, including chair exercises. She is also encouraged to cut back on her intake of processed foods.   [name], MD    THE PATIENT IS ENCOURAGED TO PRACTICE SOCIAL DISTANCING DUE TO THE COVID-19 PANDEMIC.

## 2019-01-13 LAB — CBC
Hematocrit: 43 % (ref 34.0–46.6)
Hemoglobin: 13.9 g/dL (ref 11.1–15.9)
MCH: 28.3 pg (ref 26.6–33.0)
MCHC: 32.3 g/dL (ref 31.5–35.7)
MCV: 87 fL (ref 79–97)
Platelets: 185 10*3/uL (ref 150–450)
RBC: 4.92 x10E6/uL (ref 3.77–5.28)
RDW: 13.5 % (ref 11.7–15.4)
WBC: 5.8 10*3/uL (ref 3.4–10.8)

## 2019-01-13 LAB — CMP14+EGFR
ALT: 20 IU/L (ref 0–32)
AST: 26 IU/L (ref 0–40)
Albumin/Globulin Ratio: 1.3 (ref 1.2–2.2)
Albumin: 4.3 g/dL (ref 3.7–4.7)
Alkaline Phosphatase: 65 IU/L (ref 39–117)
BUN/Creatinine Ratio: 14 (ref 12–28)
BUN: 12 mg/dL (ref 8–27)
Bilirubin Total: 0.5 mg/dL (ref 0.0–1.2)
CO2: 23 mmol/L (ref 20–29)
Calcium: 10.6 mg/dL — ABNORMAL HIGH (ref 8.7–10.3)
Chloride: 98 mmol/L (ref 96–106)
Creatinine, Ser: 0.87 mg/dL (ref 0.57–1.00)
GFR calc Af Amer: 76 mL/min/{1.73_m2} (ref >59)
GFR calc non Af Amer: 66 mL/min/{1.73_m2} (ref >59)
Globulin, Total: 3.2 g/dL (ref 1.5–4.5)
Glucose: 109 mg/dL — ABNORMAL HIGH (ref 65–99)
Potassium: 3.8 mmol/L (ref 3.5–5.2)
Sodium: 137 mmol/L (ref 134–144)
Total Protein: 7.5 g/dL (ref 6.0–8.5)

## 2019-01-13 LAB — URIC ACID: Uric Acid: 6.5 mg/dL (ref 2.5–7.1)

## 2019-01-13 LAB — LIPID PANEL
Chol/HDL Ratio: 3.1 ratio (ref 0.0–4.4)
Cholesterol, Total: 175 mg/dL (ref 100–199)
HDL: 57 mg/dL (ref >39)
LDL Calculated: 98 mg/dL (ref 0–99)
Triglycerides: 100 mg/dL (ref 0–149)
VLDL Cholesterol Cal: 20 mg/dL (ref 5–40)

## 2019-01-13 LAB — HEMOGLOBIN A1C
Est. average glucose Bld gHb Est-mCnc: 131 mg/dL
Hgb A1c MFr Bld: 6.2 % — ABNORMAL HIGH (ref 4.8–5.6)

## 2019-01-13 LAB — CYCLIC CITRUL PEPTIDE ANTIBODY, IGG/IGA: Cyclic Citrullin Peptide Ab: 7 units (ref 0–19)

## 2019-01-13 LAB — ANTINUCLEAR ANTIBODIES, IFA: ANA Titer 1: NEGATIVE

## 2019-01-13 LAB — RHEUMATOID FACTOR: Rhuematoid fact SerPl-aCnc: <10 IU/mL (ref 0.0–13.9)

## 2019-01-13 LAB — SEDIMENTATION RATE: Sed Rate: 19 mm/hr (ref 0–40)

## 2019-01-15 ENCOUNTER — Telehealth: Payer: Self-pay

## 2019-01-15 NOTE — Telephone Encounter (Signed)
-----   Message from Glendale Chard, MD sent at 01/14/2019  9:11 PM EDT ----- Rheumatoid arthritis test is negative.  Your kidney fxn is nl. Your calcium level is sl. Elevated.  Blood count is nl. Your chol is great. Your hba1c is 6.2, this is higher than last viist. Be sure to cut back on intake of juices, sodas. Increase water intake. Decrease desserts in diet.

## 2019-01-15 NOTE — Telephone Encounter (Signed)
Left the patient a message to call back for lab results. 

## 2019-01-18 ENCOUNTER — Ambulatory Visit: Payer: Self-pay

## 2019-01-18 DIAGNOSIS — N182 Chronic kidney disease, stage 2 (mild): Secondary | ICD-10-CM

## 2019-01-18 DIAGNOSIS — I129 Hypertensive chronic kidney disease with stage 1 through stage 4 chronic kidney disease, or unspecified chronic kidney disease: Secondary | ICD-10-CM

## 2019-01-18 NOTE — Chronic Care Management (AMB) (Signed)
  Chronic Care Management   Outreach Note  4/72/0721 Name: Cynthia Barton MRN: [id number] DOB: Oct 27, 1945  Referred by: Glendale Chard, MD Reason for referral : Medication Assistance  Second unsuccessful outreach to the patient to discuss CCM referral and information obtained from PharmD regarding [place] patient assistance. The patient had a previous successful call but declined enrollment until verifying the CCM team would be able to assist with medication costs.  Since initial successful call embedded PharmD has communicated the following to CCM SW: "drug Livalo is only covered under patient assistance if patient doesn't have insurance. I had our technician Caryl Pina check in to it! Patient could get 1 month supply for $38.90 but 3 months under Medicare is usually $80"  Follow Up Plan: CCM SW will contact the patient for a third and final call attempt over the next 10 days to communicate the above information and assess for patient interest in enrollment.  Daneen Schick, BSW, CDP Social Worker, Certified Dementia Practitioner [place] [facility] [place] [facility] [phone]

## 2019-01-25 ENCOUNTER — Ambulatory Visit: Payer: Self-pay

## 2019-01-25 DIAGNOSIS — I129 Hypertensive chronic kidney disease with stage 1 through stage 4 chronic kidney disease, or unspecified chronic kidney disease: Secondary | ICD-10-CM

## 2019-01-25 DIAGNOSIS — N182 Chronic kidney disease, stage 2 (mild): Secondary | ICD-10-CM

## 2019-01-25 NOTE — Chronic Care Management (AMB) (Signed)
  Care Management Note   Cynthia Barton is a 73 y.o. year old female who is a primary care patient of Glendale Chard, MD . The CM team was consulted for assistance with medication assistance.  Review of patient status, including review of consultants reports, and collaboration with appropriate care team members and the patient's provider was performed as part of comprehensive patient evaluation and provision of care management services. Telephone outreach to patient today to introduce CM services.   I reached out to [employer] by phone today as a follow up to previous enrollment calls.   [name] was given information about Chronic Care Management services today including:  1. CCM service includes personalized support from designated clinical staff supervised by her physician, including individualized plan of care and coordination with other care providers 2. 24/7 contact phone numbers for assistance for urgent and routine care needs. 3. Service will only be billed when office clinical staff spend 20 minutes or more in a month to coordinate care. 4. Only one practitioner may furnish and bill the service in a calendar month. 5. The patient may stop CCM services at any time (effective at the end of the month) by phone call to the office staff. 6. The patient will be responsible for cost sharing (co-pay) of up to 20% of the service fee (after annual deductible is met).   Patient did not agree to enrollment in care management services and does not wish to consider at this time.  The patient understands she does not qualify for assistance for prescription Livalo and states "I will just keep getting samples". CCM SW explained this Probation officer does not have access to samples and to continue contacting her provider when samples are needed.  Follow Up Plan: No CCM follow up planned at this time. The patient is encouraged to contact the CCM team for future care management needs.   Daneen Schick, BSW, CDP  Social Worker, Certified Dementia Practitioner [place] [facility] [place] [facility] [phone]

## 2019-02-04 ENCOUNTER — Telehealth: Payer: Self-pay

## 2019-02-11 ENCOUNTER — Other Ambulatory Visit: Payer: Self-pay | Admitting: Internal Medicine

## 2019-04-07 LAB — HM DEXA SCAN

## 2019-04-12 ENCOUNTER — Encounter: Payer: Self-pay | Admitting: Internal Medicine

## 2019-04-12 ENCOUNTER — Telehealth: Payer: Self-pay

## 2019-04-12 NOTE — Telephone Encounter (Signed)
-----   Message from Glendale Chard, MD sent at 04/10/2019  7:24 PM EDT ----- Bone density shows osteopenia. She is advised to continue with calcium/vit d supplementation. March in place while watching TV during the commercials.   Performed on 10/14 at [facility] - pls update chart -

## 2019-04-12 NOTE — Telephone Encounter (Signed)
Pt also notified that samples of Livalo 2mg  ready for pickup.

## 2019-04-19 ENCOUNTER — Telehealth: Payer: Self-pay

## 2019-04-19 ENCOUNTER — Encounter: Payer: Self-pay | Admitting: Internal Medicine

## 2019-04-19 NOTE — Telephone Encounter (Signed)
-----   Message from Glendale Chard, MD sent at 04/18/2019  2:02 PM EDT ----- Bone density is significant for Osteopenia. She has thinning of the bone, which increases her risk for fracture. Please remind her to take calcium 500-600mg  ONCE daily and vitamin D. Is she taking these things already? Also important to walk as much as tolerated. She can also do chair exercises with cans of soup to help build strength in her arms.

## 2019-04-19 NOTE — Telephone Encounter (Signed)
Left the patient a message to call back for bone density results.

## 2019-04-20 ENCOUNTER — Telehealth: Payer: Self-pay

## 2019-04-20 NOTE — Telephone Encounter (Signed)
The pt said no she hadn't been taking any vitamin d or calcium supplements only a multivitamin but that she will go an purchase some.

## 2019-04-20 NOTE — Telephone Encounter (Signed)
-----   Message from Glendale Chard, MD sent at 04/18/2019  2:02 PM EDT ----- Bone density is significant for Osteopenia. She has thinning of the bone, which increases her risk for fracture. Please remind her to take calcium 500-600mg  ONCE daily and vitamin D. Is she taking these things already? Also important to walk as much as tolerated. She can also do chair exercises with cans of soup to help build strength in her arms.

## 2019-05-22 ENCOUNTER — Other Ambulatory Visit: Payer: Self-pay | Admitting: Internal Medicine

## 2019-05-31 ENCOUNTER — Telehealth: Payer: Self-pay

## 2019-05-31 NOTE — Telephone Encounter (Signed)
I left the pt message that samples of Livalo is ready for pickup.

## 2019-06-10 ENCOUNTER — Other Ambulatory Visit: Payer: Self-pay

## 2019-06-10 DIAGNOSIS — Z20822 Contact with and (suspected) exposure to covid-19: Secondary | ICD-10-CM

## 2019-06-11 ENCOUNTER — Other Ambulatory Visit: Payer: Self-pay

## 2019-06-11 LAB — NOVEL CORONAVIRUS, NAA: SARS-CoV-2, NAA: NOT DETECTED

## 2019-06-24 ENCOUNTER — Other Ambulatory Visit: Payer: Self-pay | Admitting: Internal Medicine

## 2019-07-06 ENCOUNTER — Other Ambulatory Visit: Payer: Self-pay

## 2019-07-06 ENCOUNTER — Telehealth: Payer: Self-pay

## 2019-07-06 NOTE — Telephone Encounter (Signed)
Pt notified that samples of livalo 2mg  is ready for pickup.

## 2019-07-14 ENCOUNTER — Ambulatory Visit: Payer: Medicare PPO

## 2019-07-14 ENCOUNTER — Ambulatory Visit (INDEPENDENT_AMBULATORY_CARE_PROVIDER_SITE_OTHER): Payer: Medicare PPO | Admitting: Internal Medicine

## 2019-07-14 ENCOUNTER — Other Ambulatory Visit: Payer: Self-pay

## 2019-07-14 ENCOUNTER — Other Ambulatory Visit: Payer: Self-pay | Admitting: Internal Medicine

## 2019-07-14 ENCOUNTER — Encounter: Payer: Self-pay | Admitting: Internal Medicine

## 2019-07-14 VITALS — BP 142/80 | HR 96 | Temp 98.1°F | Ht 59.0 in | Wt 207.8 lb

## 2019-07-14 DIAGNOSIS — Z170 Estrogen receptor positive status [ER+]: Secondary | ICD-10-CM | POA: Diagnosis not present

## 2019-07-14 DIAGNOSIS — Z0000 Encounter for general adult medical examination without abnormal findings: Secondary | ICD-10-CM | POA: Diagnosis not present

## 2019-07-14 DIAGNOSIS — I129 Hypertensive chronic kidney disease with stage 1 through stage 4 chronic kidney disease, or unspecified chronic kidney disease: Secondary | ICD-10-CM

## 2019-07-14 DIAGNOSIS — E66812 Obesity, class 2: Secondary | ICD-10-CM | POA: Insufficient documentation

## 2019-07-14 DIAGNOSIS — N182 Chronic kidney disease, stage 2 (mild): Secondary | ICD-10-CM

## 2019-07-14 DIAGNOSIS — Z23 Encounter for immunization: Secondary | ICD-10-CM | POA: Diagnosis not present

## 2019-07-14 DIAGNOSIS — Z6841 Body Mass Index (BMI) 40.0 and over, adult: Secondary | ICD-10-CM | POA: Diagnosis not present

## 2019-07-14 DIAGNOSIS — E7800 Pure hypercholesterolemia, unspecified: Secondary | ICD-10-CM | POA: Diagnosis not present

## 2019-07-14 DIAGNOSIS — R7309 Other abnormal glucose: Secondary | ICD-10-CM

## 2019-07-14 DIAGNOSIS — C50511 Malignant neoplasm of lower-outer quadrant of right female breast: Secondary | ICD-10-CM

## 2019-07-14 LAB — POCT URINALYSIS DIPSTICK
Bilirubin, UA: NEGATIVE
Glucose, UA: NEGATIVE
Ketones, UA: NEGATIVE
Nitrite, UA: NEGATIVE
Protein, UA: NEGATIVE
Spec Grav, UA: >=1.03 — AB (ref 1.010–1.025)
Urobilinogen, UA: 0.2 E.U./dL
pH, UA: 5 (ref 5.0–8.0)

## 2019-07-14 LAB — POCT UA - MICROALBUMIN
Albumin/Creatinine Ratio, Urine, POC: <30
Creatinine, POC: 200 mg/dL
Microalbumin Ur, POC: 30 mg/L

## 2019-07-14 MED ORDER — PREVNAR 13 IM SUSP: 0.5000 mL | INTRAMUSCULAR | 0 refills | Status: AC

## 2019-07-14 MED ORDER — HYDROCHLOROTHIAZIDE 12.5 MG PO CAPS: 12.5000 mg | ORAL_CAPSULE | Freq: Every day | ORAL | 2 refills | Status: DC

## 2019-07-14 MED ORDER — LIVALO 2 MG PO TABS: 2.0000 mg | ORAL_TABLET | ORAL | 3 refills | Status: DC

## 2019-07-14 NOTE — Progress Notes (Signed)
This visit occurred during the SARS-CoV-2 public health emergency.  Safety protocols were in place, including screening questions prior to the visit, additional usage of staff PPE, and extensive cleaning of exam room while observing appropriate contact time as indicated for disinfecting solutions.  Subjective:     Patient ID: Cynthia Barton , female    DOB: 1946-01-16 , 74 y.o.   MRN: [id number]   Chief Complaint  Patient presents with  . Hypertension  . Hyperlipidemia    HPI  She is here today for a bp check. She feels well. Admits that she has not been exercising regularly.   Hypertension This is a chronic problem. The current episode started more than 1 year ago. The problem has been gradually improving since onset. The problem is controlled. Pertinent negatives include no blurred vision, chest pain, palpitations or shortness of breath. Risk factors for coronary artery disease include dyslipidemia, post-menopausal state, obesity and sedentary lifestyle. The current treatment provides moderate improvement. Compliance problems include exercise.  Hypertensive end-organ damage includes kidney disease.  Hyperlipidemia This is a chronic problem. Exacerbating diseases include obesity. Pertinent negatives include no chest pain or shortness of breath. Current antihyperlipidemic treatment includes statins. The current treatment provides moderate improvement of lipids.     Past Medical History:  Diagnosis Date  . Breast cancer ([facility]) 09/07/2014   ER+/PR+/Her2- right breast  . Breast cancer of lower-outer quadrant of right female breast ([facility]) 09/06/2014  . Hyperlipidemia   . Hypertension   . Radiation 11/10/14-12/09/14   Right Breast  . Uterine prolapse 2016  . Wears dentures    top  . Wears glasses      Family History  Problem Relation Age of Onset  . Heart disease Father   . Melanoma Mother   . Kidney failure Sister 50  . Heart disease Maternal Uncle      Current Outpatient  Medications:  .  anastrozole (ARIMIDEX) 1 MG tablet, Take 1 tablet (1 mg total) by mouth daily., Disp: 90 tablet, Rfl: 3 .  Calcium Carbonate-Vitamin D (CALCIUM 600+D) 600-400 MG-UNIT tablet, Take 1 tablet by mouth daily., Disp: , Rfl:  .  Cholecalciferol (VITAMIN D3) 125 MCG (5000 UT) CAPS, Take by mouth., Disp: , Rfl:  .  hydrochlorothiazide (MICROZIDE) 12.5 MG capsule, Take 1 capsule (12.5 mg total) by mouth daily., Disp: 90 capsule, Rfl: 2 .  lisinopril (ZESTRIL) 10 MG tablet, Take 1 tablet by mouth once daily, Disp: 90 tablet, Rfl: 0 .  Multiple Vitamins-Minerals (MULTIVITAMIN WITH MINERALS) tablet, Take 1 tablet by mouth daily., Disp: , Rfl:  .  Pitavastatin Calcium (LIVALO) 2 MG TABS, Take 1 tablet (2 mg total) by mouth 3 (three) times a week. Takes on Monday Wednesday and Friday, Disp: 36 tablet, Rfl: 3 .  pneumococcal 13-valent conjugate vaccine (PREVNAR 13) SUSP injection, Inject 0.5 mLs into the muscle tomorrow at 10 am for 1 dose., Disp: 0.5 mL, Rfl: 0   No Known Allergies   Review of Systems  Constitutional: Negative.   Eyes: Negative for blurred vision.  Respiratory: Negative.  Negative for shortness of breath.   Cardiovascular: Negative.  Negative for chest pain and palpitations.  Gastrointestinal: Negative.   Neurological: Negative.   Psychiatric/Behavioral: Negative.      Today's Vitals   07/14/19 0917  BP: (!) 142/80  Pulse: 96  Temp: 98.1 F (36.7 C)  TempSrc: Oral  Weight: 207 lb 12.8 oz (94.3 kg)  Height: '4\' 11"'  (1.499 m)  PainSc: 0-No pain   Body  mass index is 41.97 kg/m.   Objective:  Physical Exam Vitals and nursing note reviewed.  Constitutional:      Appearance: Normal appearance. She is obese.  HENT:     Head: Normocephalic and atraumatic.  Cardiovascular:     Rate and Rhythm: Normal rate and regular rhythm.     Heart sounds: Normal heart sounds.  Pulmonary:     Effort: Pulmonary effort is normal.     Breath sounds: Normal breath sounds.   Skin:    General: Skin is warm.  Neurological:     General: No focal deficit present.     Mental Status: She is alert.  Psychiatric:        Mood and Affect: Mood normal.        Behavior: Behavior normal.         Assessment And Plan:     1. Benign hypertension with CKD (chronic kidney disease), stage II  Chronic, fair control. She reports compliance with meds, admits she rushed today. I will check her renal function today.   - CMP14+EGFR  2. Chronic renal disease, stage II  Chronic, I will check renal function today. She is encouraged to stay well hydrated.   3. Pure hypercholesterolemia  Chronic. She is encouraged to avoid fried foods and to incorporate more exercise into her daily routine.   - Lipid panel - TSH  4. Other abnormal glucose  HER A1C HAS BEEN ELEVATED IN THE PAST. I WILL CHECK AN A1C, BMET TODAY. SHE WAS ENCOURAGED TO AVOID SUGARY BEVERAGES AND PROCESSED FOODS INCLUDNG BREADS, RICE AND PASTA.  - Hemoglobin A1c  5. Malignant neoplasm of lower-outer quadrant of right breast of female, estrogen receptor positive ([facility])  Chronic, also followed by Oncology.   6. Class 3 severe obesity due to excess calories with serious comorbidity and body mass index (BMI) of 40.0 to 44.9 in adult [facility])  Wt Readings from Last 3 Encounters:  07/14/19 207 lb 12.8 oz (94.3 kg)  07/14/19 207 lb 12.8 oz (94.3 kg)  01/12/19 195 lb (88.5 kg)   She was made aware of 12 pound weight gain. She admits having both a treadmill and stationery bike at home. She is encouraged to start exercising 10 minutes four days per week, and gradually working up to 30 minutes five days per week. She is also encouraged to avoid sugary beverages and processed foods   [name], MD    THE PATIENT IS ENCOURAGED TO PRACTICE SOCIAL DISTANCING DUE TO THE COVID-19 PANDEMIC.

## 2019-07-14 NOTE — Progress Notes (Signed)
This visit occurred during the SARS-CoV-2 public health emergency.  Safety protocols were in place, including screening questions prior to the visit, additional usage of staff PPE, and extensive cleaning of exam room while observing appropriate contact time as indicated for disinfecting solutions.  Subjective:   Cynthia Barton is a 74 y.o. female who presents for Medicare Annual (Subsequent) preventive examination.  Review of Systems:  n/a Cardiac Risk Factors include: advanced age (>37mn, >>73women);hypertension;obesity (BMI >30kg/m2);sedentary lifestyle     Objective:     Vitals: BP (!) 142/80 (BP Location: Left Arm, Patient Position: Sitting, Cuff Size: Large)   Pulse 96   Temp 98.1 F (36.7 C) (Oral)   Ht '4\' 11"'  (1.499 m)   Wt 207 lb 12.8 oz (94.3 kg)   SpO2 97%   BMI 41.97 kg/m   Body mass index is 41.97 kg/m.  Advanced Directives 07/14/2019 07/09/2018 04/02/2017 10/22/2016 04/24/2016 10/24/2015 04/10/2015  Does Patient Have a Medical Advance Directive? No No No No No No No  Would patient like information on creating a medical advance directive? Yes (MAU/Ambulatory/Procedural Areas - Information given) No - Patient declined - - - - No - patient declined information    Tobacco Social History   Tobacco Use  Smoking Status Never Smoker  Smokeless Tobacco Never Used     Counseling given: Not Answered   Clinical Intake:  Pre-visit preparation completed: Yes  Pain : No/denies pain     Nutritional Status: BMI > 30  Obese Nutritional Risks: None Diabetes: No  How often do you need to have someone help you when you read instructions, pamphlets, or other written materials from your doctor or pharmacy?: 1 - Never What is the last grade level you completed in school?: college  Interpreter Needed?: No  Information entered by :: NAllen LPN  Past Medical History:  Diagnosis Date  . [place] 09/07[place]   ER+/PR+/Her2- right breast  . Breast cancer of lower-outer  quadrant of right female breast ([facility] 09/06/2014  . Hyperlipidemia   . Hypertension   . Radiation 11/10/14-12/09/14   Right Breast  . Uterine prolapse 2016  . Wears dentures    top  . Wears glasses    Past Surgical History:  Procedure Laterality Date  . BREAST SURGERY  1980   rt br bx-no anesth  . COLONOSCOPY  07/27/12   mild diverticulosis  . lumpectomy right breast Right 09/30/14  . TUBAL LIGATION     Family History  Problem Relation Age of Onset  . Heart disease Father   . Melanoma Mother   . Kidney failure Sister 466 . Heart disease Maternal Uncle    Social History   Socioeconomic History  . Marital status: Divorced    Spouse name: Not on file  . Number of children: Not on file  . Years of education: Not on file  . Highest education level: Not on file  Occupational History  . Occupation: retired  Tobacco Use  . Smoking status: Never Smoker  . Smokeless tobacco: Never Used  Substance and Sexual Activity  . Alcohol use: Not Currently    Alcohol/week: 0.0 standard drinks  . Drug use: No  . Sexual activity: Not Currently    Comment: 1st intercourse 74 yo-Fewer than 5 partners  Other Topics Concern  . Not on file  Social History Narrative  . Not on file   Social Determinants of Health   Financial Resource Strain: Low Risk   . Difficulty of Paying Living Expenses:  Not hard at all  Food Insecurity: No Food Insecurity  . Worried About Charity fundraiser in the Last Year: Never true  . Ran Out of Food in the Last Year: Never true  Transportation Needs: No Transportation Needs  . Lack of Transportation (Medical): No  . Lack of Transportation (Non-Medical): No  Physical Activity: Inactive  . Days of Exercise per Week: 0 days  . Minutes of Exercise per Session: 0 min  Stress: No Stress Concern Present  . Feeling of Stress : Not at all  Social Connections:   . Frequency of Communication with Friends and Family: Not on file  . Frequency of Social Gatherings with  Friends and Family: Not on file  . Attends Religious Services: Not on file  . Active Member of Clubs or Organizations: Not on file  . Attends Archivist Meetings: Not on file  . Marital Status: Not on file    Outpatient Encounter Medications as of 07/14/2019  Medication Sig  . anastrozole (ARIMIDEX) 1 MG tablet Take 1 tablet (1 mg total) by mouth daily.  . Calcium Carbonate-Vitamin D (CALCIUM 600+D) 600-400 MG-UNIT tablet Take 1 tablet by mouth daily.  . Cholecalciferol (VITAMIN D3) 125 MCG (5000 UT) CAPS Take by mouth.  Marland Kitchen lisinopril (ZESTRIL) 10 MG tablet Take 1 tablet by mouth once daily  . Multiple Vitamins-Minerals (MULTIVITAMIN WITH MINERALS) tablet Take 1 tablet by mouth daily.  . [DISCONTINUED] hydrochlorothiazide (MICROZIDE) 12.5 MG capsule Take 1 capsule by mouth once daily  . [DISCONTINUED] Pitavastatin Calcium (LIVALO) 2 MG TABS Take 1 tablet (2 mg total) by mouth 3 (three) times a week. Takes on Monday Wednesday and Friday  . pneumococcal 13-valent conjugate vaccine (PREVNAR 13) SUSP injection Inject 0.5 mLs into the muscle tomorrow at 10 am for 1 dose.   No facility-administered encounter medications on file as of 07/14/2019.    Activities of Daily Living In your present state of health, do you have any difficulty performing the following activities: 07/14/2019  Hearing? N  Vision? N  Difficulty concentrating or making decisions? N  Walking or climbing stairs? N  Dressing or bathing? N  Doing errands, shopping? N  Preparing Food and eating ? N  Using the Toilet? N  In the past six months, have you accidently leaked urine? Y  Comment wears a depends  Do you have problems with loss of bowel control? N  Managing your Medications? N  Managing your Finances? N  Housekeeping or managing your Housekeeping? N  Some recent data might be hidden    Patient Care Team: Glendale Chard, MD as PCP - General (Internal Medicine) Jovita Kussmaul, MD as Consulting Physician  (General Surgery) Nicholas Lose, MD as Consulting Physician (Hematology and Oncology) Thea Silversmith, MD as Consulting Physician (Radiation Oncology) Rockwell Germany, RN as Registered Nurse Mauro Kaufmann, RN as Registered Nurse Holley Bouche, NP (Inactive) as Nurse Practitioner (Nurse Practitioner) Sylvan Cheese, NP as Nurse Practitioner (Hematology and Oncology)    Assessment:   This is a routine wellness examination for Cynthia Barton.  Exercise Activities and Dietary recommendations Current Exercise Habits: The patient does not participate in regular exercise at present  Goals    . Patient Stated (pt-stated)     Wants to go on a cruise.    . Weight (lb) < 200 lb (90.7 kg)     07/14/2019, wants to weigh 190 pounds       Fall Risk Fall Risk  07/14/2019 01/12/2019 07/09/2018  04/23/2018 10/26/2014  Falls in the past year? 0 0 0 No No  Risk for fall due to : Medication side effect - Medication side effect - -  Follow up - - Falls prevention discussed;Education provided - -   Is the patient's home free of loose throw rugs in walkways, pet beds, electrical cords, etc?   yes      Grab bars in the bathroom? no      Handrails on the stairs?   yes      Adequate lighting?   yes  Timed Get Up and Go performed: n/a  Depression Screen PHQ 2/9 Scores 07/14/2019 01/12/2019 07/09/2018 04/23/2018  PHQ - 2 Score 0 0 0 0  PHQ- 9 Score 0 - 0 -     Cognitive Function     6CIT Screen 07/14/2019 07/09/2018  What Year? 0 points 0 points  What month? 0 points 0 points  What time? 0 points 0 points  Count back from 20 0 points 0 points  Months in reverse 0 points 0 points  Repeat phrase 2 points 2 points  Total Score 2 2    Immunization History  Administered Date(s) Administered  . Influenza, High Dose Seasonal PF 04/23/2018, 03/22/2019  . Zoster Recombinat (Shingrix) 02/15/2019, 04/16/2019    Qualifies for Shingles Vaccine? yes  Screening Tests Health Maintenance  Topic  Date Due  . PNA vac Low Risk Adult (1 of 2 - PCV13) 08/30/2010  . MAMMOGRAM  09/07/2020  . COLONOSCOPY  07/27/2022  . TETANUS/TDAP  02/01/2024  . INFLUENZA VACCINE  Completed  . DEXA SCAN  Completed  . Hepatitis C Screening  Completed    Cancer Screenings: Lung: Low Dose CT Chest recommended if Age 61-80 years, 30 pack-year currently smoking OR have quit w/in 15years. Patient does not qualify. Breast:  Up to date on Mammogram? Yes   Up to date of Bone Density/Dexa? Yes Colorectal: up to date  Additional Screenings: : Hepatitis C Screening: 05/2012     Plan:    Patient wants to weigh 190 pounds.   I have personally reviewed and noted the following in the patient's chart:   . Medical and social history . Use of alcohol, tobacco or illicit drugs  . Current medications and supplements . Functional ability and status . Nutritional status . Physical activity . Advanced directives . List of other physicians . Hospitalizations, surgeries, and ER visits in previous 12 months . Vitals . Screenings to include cognitive, depression, and falls . Referrals and appointments  In addition, I have reviewed and discussed with patient certain preventive protocols, quality metrics, and best practice recommendations. A written personalized care plan for preventive services as well as general preventive health recommendations were provided to patient.     Kellie Simmering, LPN  1/61/0960

## 2019-07-14 NOTE — Patient Instructions (Signed)
COVID-19 Vaccine Information can be found at: [email or web address] For questions related to vaccine distribution or appointments, please email [email or web address] or call [phone].     High Cholesterol  High cholesterol is a condition in which the blood has high levels of a white, waxy, fat-like substance (cholesterol). The human body needs small amounts of cholesterol. The liver makes all the cholesterol that the body needs. Extra (excess) cholesterol comes from the food that we eat. Cholesterol is carried from the liver by the blood through the blood vessels. If you have high cholesterol, deposits (plaques) may build up on the walls of your blood vessels (arteries). Plaques make the arteries narrower and stiffer. Cholesterol plaques increase your risk for heart attack and stroke. Work with your health care provider to keep your cholesterol levels in a healthy range. What increases the risk? This condition is more likely to develop in people who:  Eat foods that are high in animal fat (saturated fat) or cholesterol.  Are overweight.  Are not getting enough exercise.  Have a family history of high cholesterol. What are the signs or symptoms? There are no symptoms of this condition. How is this diagnosed? This condition may be diagnosed from the results of a blood test.  If you are older than age 74, your health care provider may check your cholesterol every 4-6 years.  You may be checked more often if you already have high cholesterol or other risk factors for heart disease. The blood test for cholesterol measures:  "Bad" cholesterol (LDL cholesterol). This is the main type of cholesterol that causes heart disease. The desired level for LDL is less than 100.  "Good" cholesterol (HDL cholesterol). This type helps to protect against heart disease by cleaning the arteries and carrying the LDL away. The desired level for HDL is  60 or higher.  Triglycerides. These are fats that the body can store or burn for energy. The desired number for triglycerides is lower than 150.  Total cholesterol. This is a measure of the total amount of cholesterol in your blood, including LDL cholesterol, HDL cholesterol, and triglycerides. A healthy number is less than 200. How is this treated? This condition is treated with diet changes, lifestyle changes, and medicines. Diet changes  This may include eating more whole grains, fruits, vegetables, nuts, and fish.  This may also include cutting back on red meat and foods that have a lot of added sugar. Lifestyle changes  Changes may include getting at least 40 minutes of aerobic exercise 3 times a week. Aerobic exercises include walking, biking, and swimming. Aerobic exercise along with a healthy diet can help you maintain a healthy weight.  Changes may also include quitting smoking. Medicines  Medicines are usually given if diet and lifestyle changes have failed to reduce your cholesterol to healthy levels.  Your health care provider may prescribe a statin medicine. Statin medicines have been shown to reduce cholesterol, which can reduce the risk of heart disease. Follow these instructions at home: Eating and drinking If told by your health care provider:  Eat chicken (without skin), fish, veal, shellfish, ground [place] breast, and round or loin cuts of red meat.  Do not eat fried foods or fatty meats, such as hot dogs and salami.  Eat plenty of fruits, such as apples.  Eat plenty of vegetables, such as broccoli, potatoes, and carrots.  Eat beans, peas, and lentils.  Eat grains such as barley, rice, couscous, and bulgur wheat.  Eat pasta without cream sauces.  Use skim or nonfat milk, and eat low-fat or nonfat yogurt and cheeses.  Do not eat or drink whole milk, cream, ice cream, egg yolks, or hard cheeses.  Do not eat stick margarine or tub margarines that contain  trans fats (also called partially hydrogenated oils).  Do not eat saturated tropical oils, such as coconut oil and palm oil.  Do not eat cakes, cookies, crackers, or other baked goods that contain trans fats.  General instructions  Exercise as directed by your health care provider. Increase your activity level with activities such as gardening, walking, and taking the stairs.  Take over-the-counter and prescription medicines only as told by your health care provider.  Do not use any products that contain nicotine or tobacco, such as cigarettes and e-cigarettes. If you need help quitting, ask your health care provider.  Keep all follow-up visits as told by your health care provider. This is important. Contact a health care provider if:  You are struggling to maintain a healthy diet or weight.  You need help to start on an exercise program.  You need help to stop smoking. Get help right away if:  You have chest pain.  You have trouble breathing. This information is not intended to replace advice given to you by your health care provider. Make sure you discuss any questions you have with your health care provider. Document Revised: 06/13/2017 Document Reviewed: 12/09/2015 Elsevier Patient Education  [place].

## 2019-07-14 NOTE — Patient Instructions (Signed)
Cynthia Barton , Thank you for taking time to come for your Medicare Wellness Visit. I appreciate your ongoing commitment to your health goals. Please review the following plan we discussed and let me know if I can assist you in the future.  Screening recommendations/referrals: Colonoscopy: 07/2012 Mammogram: 08/2018 Bone Density: 03/2019 Recommended yearly ophthalmology/optometry visit for glaucoma screening and checkup Recommended yearly dental visit for hygiene and checkup  Vaccinations: Influenza vaccine: 02/2019 Pneumococcal vaccine: sent to pharmacy Tdap vaccine: 01/2014 Shingles vaccine: 03/2019    Advanced directives: Advance directive discussed with you today. I have provided a copy for you to complete at home and have notarized. Once this is complete please bring a copy in to our office so we can scan it into your chart.   Conditions/risks identified: obesity  Next appointment: 01/18/2020 at 8:45   Preventive Care 74 Years and Older, Female Preventive care refers to lifestyle choices and visits with your health care provider that can promote health and wellness. What does preventive care include?  A yearly physical exam. This is also called an annual well check.  Dental exams once or twice a year.  Routine eye exams. Ask your health care provider how often you should have your eyes checked.  Personal lifestyle choices, including:  Daily care of your teeth and gums.  Regular physical activity.  Eating a healthy diet.  Avoiding tobacco and drug use.  Limiting alcohol use.  Practicing safe sex.  Taking low-dose aspirin every day.  Taking vitamin and mineral supplements as recommended by your health care provider. What happens during an annual well check? The services and screenings done by your health care provider during your annual well check will depend on your age, overall health, lifestyle risk factors, and family history of disease. Counseling  Your health  care provider may ask you questions about your:  Alcohol use.  Tobacco use.  Drug use.  Emotional well-being.  Home and relationship well-being.  Sexual activity.  Eating habits.  History of falls.  Memory and ability to understand (cognition).  Work and work Statistician.  Reproductive health. Screening  You may have the following tests or measurements:  Height, weight, and BMI.  Blood pressure.  Lipid and cholesterol levels. These may be checked every 5 years, or more frequently if you are over 38 years old.  Skin check.  Lung cancer screening. You may have this screening every year starting at age 73 if you have a 30-pack-year history of smoking and currently smoke or have quit within the past 15 years.  Fecal occult blood test (FOBT) of the stool. You may have this test every year starting at age 77.  Flexible sigmoidoscopy or colonoscopy. You may have a sigmoidoscopy every 5 years or a colonoscopy every 10 years starting at age 52.  Hepatitis C blood test.  Hepatitis B blood test.  Sexually transmitted disease (STD) testing.  Diabetes screening. This is done by checking your blood sugar (glucose) after you have not eaten for a while (fasting). You may have this done every 1-3 years.  Bone density scan. This is done to screen for osteoporosis. You may have this done starting at age 59.  Mammogram. This may be done every 1-2 years. Talk to your health care provider about how often you should have regular mammograms. Talk with your health care provider about your test results, treatment options, and if necessary, the need for more tests. Vaccines  Your health care provider may recommend certain vaccines, such as:  Influenza vaccine. This is recommended every year.  Tetanus, diphtheria, and acellular pertussis (Tdap, Td) vaccine. You may need a Td booster every 10 years.  Zoster vaccine. You may need this after age 19.  Pneumococcal 13-valent conjugate  (PCV13) vaccine. One dose is recommended after age 33.  Pneumococcal polysaccharide (PPSV23) vaccine. One dose is recommended after age 84. Talk to your health care provider about which screenings and vaccines you need and how often you need them. This information is not intended to replace advice given to you by your health care provider. Make sure you discuss any questions you have with your health care provider. Document Released: 07/07/2015 Document Revised: 02/28/2016 Document Reviewed: 04/11/2015 Elsevier Interactive Patient Education  2017 [employer] Prevention in the Home Falls can cause injuries. They can happen to people of all ages. There are many things you can do to make your home safe and to help prevent falls. What can I do on the outside of my home?  Regularly fix the edges of walkways and driveways and fix any cracks.  Remove anything that might make you trip as you walk through a door, such as a raised step or threshold.  Trim any bushes or trees on the path to your home.  Use bright outdoor lighting.  Clear any walking paths of anything that might make someone trip, such as rocks or tools.  Regularly check to see if handrails are loose or broken. Make sure that both sides of any steps have handrails.  Any raised decks and porches should have guardrails on the edges.  Have any leaves, snow, or ice cleared regularly.  Use sand or salt on walking paths during winter.  Clean up any spills in your garage right away. This includes oil or grease spills. What can I do in the bathroom?  Use night lights.  Install grab bars by the toilet and in the tub and shower. Do not use towel bars as grab bars.  Use non-skid mats or decals in the tub or shower.  If you need to sit down in the shower, use a plastic, non-slip stool.  Keep the floor dry. Clean up any water that spills on the floor as soon as it happens.  Remove soap buildup in the tub or shower  regularly.  Attach bath mats securely with double-sided non-slip rug tape.  Do not have throw rugs and other things on the floor that can make you trip. What can I do in the bedroom?  Use night lights.  Make sure that you have a light by your bed that is easy to reach.  Do not use any sheets or blankets that are too big for your bed. They should not hang down onto the floor.  Have a firm chair that has side arms. You can use this for support while you get dressed.  Do not have throw rugs and other things on the floor that can make you trip. What can I do in the kitchen?  Clean up any spills right away.  Avoid walking on wet floors.  Keep items that you use a lot in easy-to-reach places.  If you need to reach something above you, use a strong step stool that has a grab bar.  Keep electrical cords out of the way.  Do not use floor polish or wax that makes floors slippery. If you must use wax, use non-skid floor wax.  Do not have throw rugs and other things on the floor that  can make you trip. What can I do with my stairs?  Do not leave any items on the stairs.  Make sure that there are handrails on both sides of the stairs and use them. Fix handrails that are broken or loose. Make sure that handrails are as long as the stairways.  Check any carpeting to make sure that it is firmly attached to the stairs. Fix any carpet that is loose or worn.  Avoid having throw rugs at the top or bottom of the stairs. If you do have throw rugs, attach them to the floor with carpet tape.  Make sure that you have a light switch at the top of the stairs and the bottom of the stairs. If you do not have them, ask someone to add them for you. What else can I do to help prevent falls?  Wear shoes that:  Do not have high heels.  Have rubber bottoms.  Are comfortable and fit you well.  Are closed at the toe. Do not wear sandals.  If you use a stepladder:  Make sure that it is fully  opened. Do not climb a closed stepladder.  Make sure that both sides of the stepladder are locked into place.  Ask someone to hold it for you, if possible.  Clearly mark and make sure that you can see:  Any grab bars or handrails.  First and last steps.  Where the edge of each step is.  Use tools that help you move around (mobility aids) if they are needed. These include:  Canes.  Walkers.  Scooters.  Crutches.  Turn on the lights when you go into a dark area. Replace any light bulbs as soon as they burn out.  Set up your furniture so you have a clear path. Avoid moving your furniture around.  If any of your floors are uneven, fix them.  If there are any pets around you, be aware of where they are.  Review your medicines with your doctor. Some medicines can make you feel dizzy. This can increase your chance of falling. Ask your doctor what other things that you can do to help prevent falls. This information is not intended to replace advice given to you by your health care provider. Make sure you discuss any questions you have with your health care provider. Document Released: 04/06/2009 Document Revised: 11/16/2015 Document Reviewed: 07/15/2014 Elsevier Interactive Patient Education  2017 [employer].

## 2019-07-15 LAB — CMP14+EGFR
ALT: 20 IU/L (ref 0–32)
AST: 25 IU/L (ref 0–40)
Albumin/Globulin Ratio: 1.3 (ref 1.2–2.2)
Albumin: 4 g/dL (ref 3.7–4.7)
Alkaline Phosphatase: 66 IU/L (ref 39–117)
BUN/Creatinine Ratio: 14 (ref 12–28)
BUN: 12 mg/dL (ref 8–27)
Bilirubin Total: 0.4 mg/dL (ref 0.0–1.2)
CO2: 25 mmol/L (ref 20–29)
Calcium: 10.9 mg/dL — ABNORMAL HIGH (ref 8.7–10.3)
Chloride: 103 mmol/L (ref 96–106)
Creatinine, Ser: 0.86 mg/dL (ref 0.57–1.00)
GFR calc Af Amer: 78 mL/min/{1.73_m2} (ref >59)
GFR calc non Af Amer: 67 mL/min/{1.73_m2} (ref >59)
Globulin, Total: 3.1 g/dL (ref 1.5–4.5)
Glucose: 111 mg/dL — ABNORMAL HIGH (ref 65–99)
Potassium: 4.2 mmol/L (ref 3.5–5.2)
Sodium: 140 mmol/L (ref 134–144)
Total Protein: 7.1 g/dL (ref 6.0–8.5)

## 2019-07-15 LAB — HEMOGLOBIN A1C
Est. average glucose Bld gHb Est-mCnc: 128 mg/dL
Hgb A1c MFr Bld: 6.1 % — ABNORMAL HIGH (ref 4.8–5.6)

## 2019-07-15 LAB — LIPID PANEL
Chol/HDL Ratio: 2.7 ratio (ref 0.0–4.4)
Cholesterol, Total: 156 mg/dL (ref 100–199)
HDL: 58 mg/dL (ref >39)
LDL Chol Calc (NIH): 82 mg/dL (ref 0–99)
Triglycerides: 83 mg/dL (ref 0–149)
VLDL Cholesterol Cal: 16 mg/dL (ref 5–40)

## 2019-07-15 LAB — TSH: TSH: 2.18 u[IU]/mL (ref 0.450–4.500)

## 2019-08-19 ENCOUNTER — Other Ambulatory Visit: Payer: Self-pay | Admitting: *Deleted

## 2019-08-19 ENCOUNTER — Telehealth: Payer: Self-pay | Admitting: Hematology and Oncology

## 2019-08-19 DIAGNOSIS — Z170 Estrogen receptor positive status [ER+]: Secondary | ICD-10-CM

## 2019-08-19 DIAGNOSIS — C50511 Malignant neoplasm of lower-outer quadrant of right female breast: Secondary | ICD-10-CM

## 2019-08-19 MED ORDER — ANASTROZOLE 1 MG PO TABS: 1.0000 mg | ORAL_TABLET | Freq: Every day | ORAL | 0 refills | Status: DC

## 2019-08-19 NOTE — Telephone Encounter (Signed)
Called pt per 2/25 sch message - no answer - left message and mailed letter

## 2019-09-21 ENCOUNTER — Telehealth: Payer: Self-pay

## 2019-09-21 NOTE — Telephone Encounter (Signed)
The pt ws notified that her samples of Livalo is ready for pickup.

## 2019-10-07 NOTE — Progress Notes (Signed)
Patient Care Team: Glendale Chard, MD as PCP - General (Internal Medicine) Jovita Kussmaul, MD as Consulting Physician (General Surgery) Nicholas Lose, MD as Consulting Physician (Hematology and Oncology) Thea Silversmith, MD as Consulting Physician (Radiation Oncology) Rockwell Germany, RN as Registered Nurse Mauro Kaufmann, RN as Registered Nurse Holley Bouche, NP (Inactive) as Nurse Practitioner (Nurse Practitioner) Sylvan Cheese, NP as Nurse Practitioner (Hematology and Oncology)  DIAGNOSIS:    ICD-10-CM   1. Malignant neoplasm of lower-outer quadrant of right breast of female, estrogen receptor positive ([place])  C50.511    Z17.0     SUMMARY OF ONCOLOGIC HISTORY: Oncology History  Breast cancer of lower-outer quadrant of right female breast ([place])  08/31/2014 Mammogram   Right breast: irregular mass with spiculated margin and heterogenous calcifications central to the nipple, middle depth   08/31/2014 Breast US   Right breast: 1.5 cm irregular mass, spiculared margin, right breast LOQ, middle depth, hypoechoic with posterior acoustic shadowing   09/01/2014 Initial Biopsy   Right breast core needle bx: invasive ductal carcinoma with DCIS, grade 2, ER+ (100%), PR+ (29%), HER-2 negative (ratio 1.00), Ki-67 21%,    09/08/2014 Breast MRI   Right breast: There is a spiculated enhancing mass with washout kinetics in the lateral right breast at the level of the nipple mid to posterior third depth measuring 1.7 x 1.8 x 1.7 cm with associated clip   09/08/2014 Clinical Stage   Stage IA: T1c N0   09/30/2014 Definitive Surgery   Right lumpectomy/[facility] Marlou Starks): Invasive ductal carcinoma 1.8 cm with DCIS ER+ (100%), PR+ (29%), HER-2 repeated and remains negative (ratio 1.00), Ki-67 21%. 1 N removed and negative for malignancy (0/1).   09/30/2014 Pathologic Stage   Stage IA: pT1c pN0    09/30/2014 Oncotype testing   Score: 17 (11% ROR). No chemotherapy.   11/10/2014 - 12/09/2014  Radiation Therapy   Adjuvant RT Pablo Ledger): Right breast/ 42.72 Gy over 21 fractions. Right breast boost/ 10 Gy over 5 fractions   12/23/2014 -  Anti-estrogen oral therapy   Anastrozole 1 mg daily. Planned duration of therapy 5-10 years.   05/09/2015 Survivorship   Survivorship visit completed and copy of care plan provided to patient.     CHIEF COMPLIANT: Follow-up of right breast cancer on anastrozole therapy  INTERVAL HISTORY: Cynthia Barton is a 74 y.o. with above-mentioned history of right breast cancer treated with lumpectomy, radiation, and who is currently on anti-estrogen therapy with anastrozole. She presents to the clinic today for annual follow-up.   She is tolerating anastrozole extremely well without any problems or concerns.  She had her COVID-19 vaccines and did extremely well  ALLERGIES:  has No Known Allergies.  MEDICATIONS:  Current Outpatient Medications  Medication Sig Dispense Refill  . anastrozole (ARIMIDEX) 1 MG tablet Take 1 tablet (1 mg total) by mouth daily. 90 tablet 0  . Calcium Carbonate-Vitamin D (CALCIUM 600+D) 600-400 MG-UNIT tablet Take 1 tablet by mouth daily.    . Cholecalciferol (VITAMIN D3) 125 MCG (5000 UT) CAPS Take by mouth.    . hydrochlorothiazide (MICROZIDE) 12.5 MG capsule Take 1 capsule (12.5 mg total) by mouth daily. 90 capsule 2  . lisinopril (ZESTRIL) 10 MG tablet Take 1 tablet by mouth once daily 90 tablet 0  . Multiple Vitamins-Minerals (MULTIVITAMIN WITH MINERALS) tablet Take 1 tablet by mouth daily.    . Pitavastatin Calcium (LIVALO) 2 MG TABS Take 1 tablet (2 mg total) by mouth 3 (three) times a week.  Takes on Monday Wednesday and Friday 36 tablet 3   No current facility-administered medications for this visit.    PHYSICAL EXAMINATION: ECOG PERFORMANCE STATUS: 1 - Symptomatic but completely ambulatory  Vitals:   10/08/19 1151  BP: (!) 156/92  Pulse: 87  Resp: 16  Temp: 98.5 F (36.9 C)  SpO2: 99%   Filed Weights    10/08/19 1151  Weight: 210 lb 9.6 oz (95.5 kg)    BREAST: No palpable masses or nodules in either right or left breasts. No palpable axillary supraclavicular or infraclavicular adenopathy no breast tenderness or nipple discharge. (exam performed in the presence of a chaperone)  LABORATORY DATA:  I have reviewed the data as listed CMP Latest Ref Rng & Units 07/14/2019 01/12/2019 07/09/2018  Glucose 65 - 99 mg/dL 111(H) 109(H) 95  BUN 8 - 27 mg/dL '12 12 11  ' Creatinine 0.57 - 1.00 mg/dL 0.86 0.87 0.75  Sodium 134 - 144 mmol/L 140 137 140  Potassium 3.5 - 5.2 mmol/L 4.2 3.8 4.1  Chloride 96 - 106 mmol/L 103 98 101  CO2 20 - 29 mmol/L '25 23 26  ' Calcium 8.7 - 10.3 mg/dL 10.9(H) 10.6(H) 10.6(H)  Total Protein 6.0 - 8.5 g/dL 7.1 7.5 -  Total Bilirubin 0.0 - 1.2 mg/dL 0.4 0.5 -  Alkaline Phos 39 - 117 IU/L 66 65 -  AST 0 - 40 IU/L 25 26 -  ALT 0 - 32 IU/L 20 20 -    Lab Results  Component Value Date   WBC 5.8 01/12/2019   HGB 13.9 01/12/2019   HCT 43.0 01/12/2019   MCV 87 01/12/2019   PLT 185 01/12/2019   NEUTROABS 4.5 04/02/2017    ASSESSMENT & PLAN:  Breast cancer of lower-outer quadrant of right female breast Right lumpectomy 09/30/2014: 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 29%, HER-2 negative ratio 1, Ki-67 21%, T1 cN0 M0 stage IA Oncotype Dx 17 (Low Risk) S/P XRT  Current treatment: Antiestrogen therapy once daily for 5 years started 12/23/2014  I discussed the pros and cons of extended adjuvant therapy.  There is data to support 7 years of antiestrogen therapy being superior to 5.  We discussed that the biggest risk would be worsening bone density.  Anastrozole toxicities: 1. Occasional hot flashes 2. Muscle aches and pains  Breast cancer surveillance:  1. Breast exam4/16/2021: No palpable nodules or concerns 2. Mammogram 3/17/2020at [facility]: Postsurgical changes breast density category B 3. Bone density 04/07/2019: T score -1.9  osteopenia: Continue with  calcium and vitamin D and weightbearing exercises  Return to clinic in1 yearfor follow-up.    No orders of the defined types were placed in this encounter.  The patient has a good understanding of the overall plan. she agrees with it. she will call with any problems that may develop before the next visit here.  Total time spent: 20 mins including face to face time and time spent for planning, charting and coordination of care  Nicholas Lose, MD 10/08/2019  I, Cloyde Reams Dorshimer, am acting as scribe for Dr. Nicholas Lose.  I have reviewed the above documentation for accuracy and completeness, and I agree with the above.

## 2019-10-08 ENCOUNTER — Inpatient Hospital Stay: Payer: Medicare PPO | Attending: Hematology and Oncology | Admitting: Hematology and Oncology

## 2019-10-08 ENCOUNTER — Other Ambulatory Visit: Payer: Self-pay

## 2019-10-08 DIAGNOSIS — Z923 Personal history of irradiation: Secondary | ICD-10-CM | POA: Diagnosis not present

## 2019-10-08 DIAGNOSIS — Z170 Estrogen receptor positive status [ER+]: Secondary | ICD-10-CM | POA: Diagnosis not present

## 2019-10-08 DIAGNOSIS — Z79811 Long term (current) use of aromatase inhibitors: Secondary | ICD-10-CM | POA: Diagnosis not present

## 2019-10-08 DIAGNOSIS — N951 Menopausal and female climacteric states: Secondary | ICD-10-CM | POA: Insufficient documentation

## 2019-10-08 DIAGNOSIS — M8580 Other specified disorders of bone density and structure, unspecified site: Secondary | ICD-10-CM | POA: Diagnosis not present

## 2019-10-08 DIAGNOSIS — C50511 Malignant neoplasm of lower-outer quadrant of right female breast: Secondary | ICD-10-CM | POA: Insufficient documentation

## 2019-10-08 DIAGNOSIS — Z79899 Other long term (current) drug therapy: Secondary | ICD-10-CM | POA: Diagnosis not present

## 2019-10-08 MED ORDER — ANASTROZOLE 1 MG PO TABS: 1.0000 mg | ORAL_TABLET | Freq: Every day | ORAL | 3 refills | Status: DC

## 2019-10-08 NOTE — Assessment & Plan Note (Signed)
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 29%, HER-2 negative ratio 1, Ki-67 21%, T1 cN0 M0 stage IA Oncotype Dx 17 (Low Risk) S/P XRT  Current treatment: Antiestrogen therapy once daily for 5 years started 12/23/2014  I discussed the pros and cons of extended adjuvant therapy.  There is data to support 7 years of antiestrogen therapy being superior to 5.  We discussed that the biggest risk would be worsening bone density.  Anastrozole toxicities: 1. Occasional hot flashes 2. Muscle aches and pains  Breast cancer surveillance:  1. Breast exam4/16/2021: No palpable nodules or concerns 2. Mammogram 3/17/2020at [facility]: Postsurgical changes breast density category B 3. Bone density 04/07/2019: T score -1.9  osteopenia: Continue with calcium and vitamin D and weightbearing exercises  Return to clinic in1 yearfor follow-up. 

## 2019-10-14 ENCOUNTER — Telehealth: Payer: Self-pay | Admitting: Hematology and Oncology

## 2019-10-14 NOTE — Telephone Encounter (Signed)
Scheduled per 04/16 los, patient has been called and notified. 

## 2019-10-25 DIAGNOSIS — R928 Other abnormal and inconclusive findings on diagnostic imaging of breast: Secondary | ICD-10-CM | POA: Diagnosis not present

## 2019-10-25 LAB — HM MAMMOGRAPHY

## 2019-10-26 ENCOUNTER — Encounter: Payer: Self-pay | Admitting: Internal Medicine

## 2019-11-23 ENCOUNTER — Telehealth: Payer: Self-pay

## 2019-11-23 NOTE — Telephone Encounter (Signed)
I called the pt to let her know that her samples of Livalo is ready for pickup.

## 2019-12-19 ENCOUNTER — Other Ambulatory Visit: Payer: Self-pay | Admitting: Internal Medicine

## 2020-01-13 ENCOUNTER — Other Ambulatory Visit: Payer: Self-pay

## 2020-01-18 ENCOUNTER — Other Ambulatory Visit: Payer: Self-pay

## 2020-01-18 ENCOUNTER — Encounter: Payer: Self-pay | Admitting: Internal Medicine

## 2020-01-18 ENCOUNTER — Ambulatory Visit: Payer: Medicare PPO | Admitting: Internal Medicine

## 2020-01-18 VITALS — BP 130/78 | HR 78 | Temp 97.6°F | Ht 58.6 in | Wt 202.4 lb

## 2020-01-18 DIAGNOSIS — R202 Paresthesia of skin: Secondary | ICD-10-CM | POA: Diagnosis not present

## 2020-01-18 DIAGNOSIS — Z0000 Encounter for general adult medical examination without abnormal findings: Secondary | ICD-10-CM | POA: Diagnosis not present

## 2020-01-18 DIAGNOSIS — Z6841 Body Mass Index (BMI) 40.0 and over, adult: Secondary | ICD-10-CM

## 2020-01-18 DIAGNOSIS — R7309 Other abnormal glucose: Secondary | ICD-10-CM | POA: Diagnosis not present

## 2020-01-18 DIAGNOSIS — I129 Hypertensive chronic kidney disease with stage 1 through stage 4 chronic kidney disease, or unspecified chronic kidney disease: Secondary | ICD-10-CM

## 2020-01-18 DIAGNOSIS — E7800 Pure hypercholesterolemia, unspecified: Secondary | ICD-10-CM | POA: Diagnosis not present

## 2020-01-18 DIAGNOSIS — Z79899 Other long term (current) drug therapy: Secondary | ICD-10-CM

## 2020-01-18 DIAGNOSIS — E559 Vitamin D deficiency, unspecified: Secondary | ICD-10-CM | POA: Diagnosis not present

## 2020-01-18 DIAGNOSIS — N182 Chronic kidney disease, stage 2 (mild): Secondary | ICD-10-CM

## 2020-01-18 LAB — POCT URINALYSIS DIPSTICK
Bilirubin, UA: NEGATIVE
Glucose, UA: NEGATIVE
Ketones, UA: NEGATIVE
Nitrite, UA: NEGATIVE
Protein, UA: NEGATIVE
Spec Grav, UA: 1.025 (ref 1.010–1.025)
Urobilinogen, UA: 0.2 E.U./dL
pH, UA: 5.5 (ref 5.0–8.0)

## 2020-01-18 NOTE — Patient Instructions (Signed)
Health Maintenance, Female Adopting a healthy lifestyle and getting preventive care are important in promoting health and wellness. Ask your health care provider about:  The right schedule for you to have regular tests and exams.  Things you can do on your own to prevent diseases and keep yourself healthy. What should I know about diet, weight, and exercise? Eat a healthy diet   Eat a diet that includes plenty of vegetables, fruits, low-fat dairy products, and lean protein.  Do not eat a lot of foods that are high in solid fats, added sugars, or sodium. Maintain a healthy weight Body mass index (BMI) is used to identify weight problems. It estimates body fat based on height and weight. Your health care provider can help determine your BMI and help you achieve or maintain a healthy weight. Get regular exercise Get regular exercise. This is one of the most important things you can do for your health. Most adults should:  Exercise for at least 150 minutes each week. The exercise should increase your heart rate and make you sweat (moderate-intensity exercise).  Do strengthening exercises at least twice a week. This is in addition to the moderate-intensity exercise.  Spend less time sitting. Even light physical activity can be beneficial. Watch cholesterol and blood lipids Have your blood tested for lipids and cholesterol at 74 years of age, then have this test every 5 years. Have your cholesterol levels checked more often if:  Your lipid or cholesterol levels are high.  You are older than 74 years of age.  You are at high risk for heart disease. What should I know about cancer screening? Depending on your health history and family history, you may need to have cancer screening at various ages. This may include screening for:  Breast cancer.  Cervical cancer.  Colorectal cancer.  Skin cancer.  Lung cancer. What should I know about heart disease, diabetes, and high blood  pressure? Blood pressure and heart disease  High blood pressure causes heart disease and increases the risk of stroke. This is more likely to develop in people who have high blood pressure readings, are of African descent, or are overweight.  Have your blood pressure checked: ? Every 3-5 years if you are 18-39 years of age. ? Every year if you are 40 years old or older. Diabetes Have regular diabetes screenings. This checks your fasting blood sugar level. Have the screening done:  Once every three years after age 40 if you are at a normal weight and have a low risk for diabetes.  More often and at a younger age if you are overweight or have a high risk for diabetes. What should I know about preventing infection? Hepatitis B If you have a higher risk for hepatitis B, you should be screened for this virus. Talk with your health care provider to find out if you are at risk for hepatitis B infection. Hepatitis C Testing is recommended for:  Everyone born from 1945 through 1965.  Anyone with known risk factors for hepatitis C. Sexually transmitted infections (STIs)  Get screened for STIs, including gonorrhea and chlamydia, if: ? You are sexually active and are younger than 74 years of age. ? You are older than 74 years of age and your health care provider tells you that you are at risk for this type of infection. ? Your sexual activity has changed since you were last screened, and you are at increased risk for chlamydia or gonorrhea. Ask your health care provider if   you are at risk.  Ask your health care provider about whether you are at high risk for HIV. Your health care provider may recommend a prescription medicine to help prevent HIV infection. If you choose to take medicine to prevent HIV, you should first get tested for HIV. You should then be tested every 3 months for as long as you are taking the medicine. Pregnancy  If you are about to stop having your period (premenopausal) and  you may become pregnant, seek counseling before you get pregnant.  Take 400 to 800 micrograms (mcg) of folic acid every day if you become pregnant.  Ask for birth control (contraception) if you want to prevent pregnancy. Osteoporosis and menopause Osteoporosis is a disease in which the bones lose minerals and strength with aging. This can result in bone fractures. If you are 65 years old or older, or if you are at risk for osteoporosis and fractures, ask your health care provider if you should:  Be screened for bone loss.  Take a calcium or vitamin D supplement to lower your risk of fractures.  Be given hormone replacement therapy (HRT) to treat symptoms of menopause. Follow these instructions at home: Lifestyle  Do not use any products that contain nicotine or tobacco, such as cigarettes, e-cigarettes, and chewing tobacco. If you need help quitting, ask your health care provider.  Do not use street drugs.  Do not share needles.  Ask your health care provider for help if you need support or information about quitting drugs. Alcohol use  Do not drink alcohol if: ? Your health care provider tells you not to drink. ? You are pregnant, may be pregnant, or are planning to become pregnant.  If you drink alcohol: ? Limit how much you use to 0-1 drink a day. ? Limit intake if you are breastfeeding.  Be aware of how much alcohol is in your drink. In the [place] one drink equals one 12 oz bottle of beer (355 mL), one 5 oz glass of wine (148 mL), or one 1 oz glass of hard liquor (44 mL). General instructions  Schedule regular health, dental, and eye exams.  Stay current with your vaccines.  Tell your health care provider if: ? You often feel depressed. ? You have ever been abused or do not feel safe at home. Summary  Adopting a healthy lifestyle and getting preventive care are important in promoting health and wellness.  Follow your health care provider's instructions about healthy  diet, exercising, and getting tested or screened for diseases.  Follow your health care provider's instructions on monitoring your cholesterol and blood pressure. This information is not intended to replace advice given to you by your health care provider. Make sure you discuss any questions you have with your health care provider. Document Revised: 06/03/2018 Document Reviewed: 06/03/2018 Elsevier Patient Education  2020 [employer]  

## 2020-01-18 NOTE — Progress Notes (Signed)
I,Tianna Badgett,acting as a Education administrator for [name], MD.,have documented all relevant documentation on the behalf of [name], MD,as directed by  [name], MD while in the presence of [name], MD.  This visit occurred during the SARS-CoV-2 public health emergency.  Safety protocols were in place, including screening questions prior to the visit, additional usage of staff PPE, and extensive cleaning of exam room while observing appropriate contact time as indicated for disinfecting solutions.  Subjective:     Patient ID: Cynthia Barton , female    DOB: April 19, 1946 , 74 y.o.   MRN: [id number]   Chief Complaint  Patient presents with  . Annual Exam  . Hypertension    HPI  She is here today for a full physical examination. She is no longer followed by GYN. She reports compliance with meds.    Hypertension This is a chronic problem. The current episode started more than 1 year ago. The problem has been gradually improving since onset. The problem is controlled. Pertinent negatives include no blurred vision, chest pain, palpitations or shortness of breath. Risk factors for coronary artery disease include obesity, post-menopausal state and sedentary lifestyle. Past treatments include diuretics. The current treatment provides moderate improvement. Hypertensive end-organ damage includes kidney disease.     Past Medical History:  Diagnosis Date  . Breast cancer ([facility]) 09/07/2014   ER+/PR+/Her2- right breast  . Breast cancer of lower-outer quadrant of right female breast ([facility]) 09/06/2014  . Hyperlipidemia   . Hypertension   . Radiation 11/10/14-12/09/14   Right Breast  . Uterine prolapse 2016  . Wears dentures    top  . Wears glasses      Family History  Problem Relation Age of Onset  . Heart disease Father   . Melanoma Mother   . Kidney failure Sister 25  . Heart disease Maternal Uncle      Current Outpatient Medications:  .  anastrozole (ARIMIDEX) 1 MG tablet,  Take 1 tablet (1 mg total) by mouth daily., Disp: 90 tablet, Rfl: 3 .  Calcium Carbonate-Vitamin D (CALCIUM 600+D) 600-400 MG-UNIT tablet, Take 1 tablet by mouth daily., Disp: , Rfl:  .  Cholecalciferol (VITAMIN D3) 125 MCG (5000 UT) CAPS, Take by mouth., Disp: , Rfl:  .  hydrochlorothiazide (MICROZIDE) 12.5 MG capsule, Take 1 capsule (12.5 mg total) by mouth daily., Disp: 90 capsule, Rfl: 2 .  lisinopril (ZESTRIL) 10 MG tablet, Take 1 tablet by mouth once daily, Disp: 90 tablet, Rfl: 0 .  Multiple Vitamins-Minerals (MULTIVITAMIN WITH MINERALS) tablet, Take 1 tablet by mouth daily., Disp: , Rfl:  .  Pitavastatin Calcium (LIVALO) 2 MG TABS, Take 1 tablet (2 mg total) by mouth 3 (three) times a week. Takes on Monday Wednesday and Friday, Disp: 36 tablet, Rfl: 3   No Known Allergies    The patient states she uses post menopausal status for birth control. Last LMP was No LMP recorded. Patient is postmenopausal.. Negative for Dysmenorrhea. Negative for: breast discharge, breast lump(s), breast pain and breast self exam. Associated symptoms include abnormal vaginal bleeding. Pertinent negatives include abnormal bleeding (hematology), anxiety, decreased libido, depression, difficulty falling sleep, dyspareunia, history of infertility, nocturia, sexual dysfunction, sleep disturbances, urinary incontinence, urinary urgency, vaginal discharge and vaginal itching. Diet regular.The patient states her exercise level is  intermittent. [facility]   . The patient's tobacco use is:  Social History   Tobacco Use  Smoking Status Never Smoker  Smokeless Tobacco Never Used  . She has been exposed to passive smoke. The patient's alcohol use is:  Social History   Substance and Sexual Activity  Alcohol Use Not Currently  . Alcohol/week: 0.0 standard drinks    Review of Systems  Constitutional: Negative.   HENT: Negative.   Eyes: Negative.  Negative for blurred vision.  Respiratory:  Negative.  Negative for shortness of breath.   Cardiovascular: Negative.  Negative for chest pain and palpitations.  Gastrointestinal: Negative.   Endocrine: Negative.   Genitourinary: Negative.   Musculoskeletal: Negative.   Skin: Negative.   Allergic/Immunologic: Negative.   Neurological: Positive for numbness.       She c/o numbness/tingling in both hands. Sx started at least six months ago. Denies h/o carpal tunnel syndrome. Denies weakness.   Hematological: Negative.   Psychiatric/Behavioral: Negative.      Today's Vitals   01/18/20 0854  BP: (!) 130/78  Pulse: 78  Temp: 97.6 F (36.4 C)  TempSrc: Oral  Weight: 202 lb 6.4 oz (91.8 kg)  Height: 4' 10.6" (1.488 m)  PainSc: 0-No pain   Body mass index is 41.44 kg/m.   Objective:  Physical Exam Constitutional:      General: She is not in acute distress.    Appearance: Normal appearance. She is well-developed. She is obese.  HENT:     Head: Normocephalic and atraumatic.     Right Ear: Hearing, tympanic membrane, ear canal and external ear normal. There is no impacted cerumen.     Left Ear: Hearing, tympanic membrane, ear canal and external ear normal. There is no impacted cerumen.     Nose:     Comments: Deferred, masked    Mouth/Throat:     Comments: Deferred, masked Eyes:     General: Lids are normal.     Extraocular Movements: Extraocular movements intact.     Conjunctiva/sclera: Conjunctivae normal.     Pupils: Pupils are equal, round, and reactive to light.     Funduscopic exam:    Right eye: No papilledema.        Left eye: No papilledema.  Neck:     Thyroid: No thyroid mass.     Vascular: No carotid bruit.  Cardiovascular:     Rate and Rhythm: Normal rate and regular rhythm.     Pulses: Normal pulses.     Heart sounds: Normal heart sounds. No murmur heard.   Pulmonary:     Effort: Pulmonary effort is normal.     Breath sounds: Normal breath sounds.  Chest:     Breasts: Tanner Score is 5.         Right: Normal.        Left: Normal.  Abdominal:     General: Abdomen is flat. Bowel sounds are normal. There is no distension.     Palpations: Abdomen is soft.     Tenderness: There is no abdominal tenderness.  Genitourinary:    Rectum: Guaiac result negative.  Musculoskeletal:        General: No swelling. Normal range of motion.     Cervical back: Full passive range of motion without pain, normal range of motion and neck supple.     Right lower leg: No edema.     Left lower leg: No edema.  Skin:    General: Skin is warm and dry.     Capillary Refill: Capillary refill takes less than 2 seconds.  Neurological:     General: No focal deficit present.  Mental Status: She is alert and oriented to person, place, and time.     Cranial Nerves: No cranial nerve deficit.     Sensory: No sensory deficit.  Psychiatric:        Mood and Affect: Mood normal.        Behavior: Behavior normal.        Thought Content: Thought content normal.        Judgment: Judgment normal.         Assessment And Plan:     1. Routine general medical examination at health care facility  A full exam was performed.  Importance of monthly self breast exams was discussed with the patient. PATIENT IS ADVISED TO GET 30-45 MINUTES REGULAR EXERCISE NO LESS THAN FOUR TO FIVE DAYS PER WEEK - BOTH WEIGHTBEARING EXERCISES AND AEROBIC ARE RECOMMENDED.  PATIENT IS ADVISED TO FOLLOW A HEALTHY DIET WITH AT LEAST SIX FRUITS/VEGGIES PER DAY, DECREASE INTAKE OF RED MEAT, AND TO INCREASE FISH INTAKE TO TWO DAYS PER WEEK.  MEATS/FISH SHOULD NOT BE FRIED, BAKED OR BROILED IS PREFERABLE.  I SUGGEST WEARING SPF 50 SUNSCREEN ON EXPOSED PARTS AND ESPECIALLY WHEN IN THE DIRECT SUNLIGHT FOR AN EXTENDED PERIOD OF TIME.  PLEASE AVOID FAST FOOD RESTAURANTS AND INCREASE YOUR WATER INTAKE.  2. Benign hypertension with CKD (chronic kidney disease), stage II  Chronic, controlled. She will continue with current meds for now. Encouraged to avoid  adding salt to her foods. EKG performed, NSR w/o acute changes. She will rto in six months for re-evaluation.   - CBC - CMP14+EGFR - Lipid panel - EKG 12-Lead - POCT Urinalysis Dipstick (81002)  3. Pure hypercholesterolemia  Chronic, encouraged to increase her daily activity and to avoid fried foods and fast foods.   4. Other abnormal glucose  HER A1C HAS BEEN ELEVATED IN THE PAST. I WILL CHECK AN A1C, BMET TODAY. SHE WAS ENCOURAGED TO AVOID SUGARY BEVERAGES AND PROCESSED FOODS INCLUDNG BREADS, RICE AND PASTA.  - Hemoglobin A1c  5. Vitamin D deficiency  I WILL CHECK A VIT D LEVEL AND SUPPLEMENT AS NEEDED.  ALSO ENCOURAGED TO SPEND 15 MINUTES IN THE SUN DAILY.  - VITAMIN D 25 Hydroxy (Vit-D Deficiency, Fractures)  6. Class 3 severe obesity due to excess calories with serious comorbidity and body mass index (BMI) of 40.0 to 44.9 in adult [facility])  She was congratulated on her 8 pound weight loss.  She is encouraged to initially strive for BMI less than 35 to decrease cardiac risk. She is advised to exercise no less than 150 minutes per week.   Wt Readings from Last 3 Encounters:  01/18/20 202 lb 6.4 oz (91.8 kg)  10/08/19 210 lb 9.6 oz (95.5 kg)  07/14/19 207 lb 12.8 oz (94.3 kg)   7. Drug therapy  She c/o UE paresthesias. I will check vitamin B12 level. If persistent, I will check nerve conduction study.   - Vitamin B12  8. Paresthesia of hand, bilateral  I will refer her for b/l UE nerve conduction study.   - Ambulatory referral to Neurology   Patient was given opportunity to ask questions. Patient verbalized understanding of the plan and was able to repeat key elements of the plan. All questions were answered to their satisfaction.   [name], MD   I, [name], MD, have reviewed all documentation for this visit. The documentation on 01/18/20 for the exam, diagnosis, procedures, and orders are all accurate and complete.  THE PATIENT IS  ENCOURAGED TO  PRACTICE SOCIAL DISTANCING DUE TO THE COVID-19 PANDEMIC.

## 2020-01-19 LAB — HEMOGLOBIN A1C
Est. average glucose Bld gHb Est-mCnc: 137 mg/dL
Hgb A1c MFr Bld: 6.4 % — ABNORMAL HIGH (ref 4.8–5.6)

## 2020-01-19 LAB — CMP14+EGFR
ALT: 22 IU/L (ref 0–32)
AST: 28 IU/L (ref 0–40)
Albumin/Globulin Ratio: 1.4 (ref 1.2–2.2)
Albumin: 4.2 g/dL (ref 3.7–4.7)
Alkaline Phosphatase: 63 IU/L (ref 48–121)
BUN/Creatinine Ratio: 18 (ref 12–28)
BUN: 14 mg/dL (ref 8–27)
Bilirubin Total: 0.4 mg/dL (ref 0.0–1.2)
CO2: 25 mmol/L (ref 20–29)
Calcium: 11.3 mg/dL — ABNORMAL HIGH (ref 8.7–10.3)
Chloride: 101 mmol/L (ref 96–106)
Creatinine, Ser: 0.8 mg/dL (ref 0.57–1.00)
GFR calc Af Amer: 84 mL/min/{1.73_m2} (ref >59)
GFR calc non Af Amer: 73 mL/min/{1.73_m2} (ref >59)
Globulin, Total: 3 g/dL (ref 1.5–4.5)
Glucose: 109 mg/dL — ABNORMAL HIGH (ref 65–99)
Potassium: 4.1 mmol/L (ref 3.5–5.2)
Sodium: 138 mmol/L (ref 134–144)
Total Protein: 7.2 g/dL (ref 6.0–8.5)

## 2020-01-19 LAB — CBC
Hematocrit: 43.2 % (ref 34.0–46.6)
Hemoglobin: 13.8 g/dL (ref 11.1–15.9)
MCH: 27.9 pg (ref 26.6–33.0)
MCHC: 31.9 g/dL (ref 31.5–35.7)
MCV: 87 fL (ref 79–97)
Platelets: 216 10*3/uL (ref 150–450)
RBC: 4.95 x10E6/uL (ref 3.77–5.28)
RDW: 13.2 % (ref 11.7–15.4)
WBC: 5.6 10*3/uL (ref 3.4–10.8)

## 2020-01-19 LAB — VITAMIN D 25 HYDROXY (VIT D DEFICIENCY, FRACTURES): Vit D, 25-Hydroxy: 64.1 ng/mL (ref 30.0–100.0)

## 2020-01-19 LAB — VITAMIN B12: Vitamin B-12: 1239 pg/mL (ref 232–1245)

## 2020-01-25 ENCOUNTER — Telehealth: Payer: Self-pay

## 2020-01-25 NOTE — Chronic Care Management (AMB) (Signed)
° ° °  Chronic Care Management Pharmacy Assistant   Name: Cynthia Barton  MRN: [id number] DOB: [birth year]  Reason for Encounter: Medication Review/ Patient Assistance   PCP : Glendale Chard, MD  Allergies:  No Known Allergies  Medications: Outpatient Encounter Medications as of 01/25/2020  Medication Sig   anastrozole (ARIMIDEX) 1 MG tablet Take 1 tablet (1 mg total) by mouth daily.   Calcium Carbonate-Vitamin D (CALCIUM 600+D) 600-400 MG-UNIT tablet Take 1 tablet by mouth daily.   Cholecalciferol (VITAMIN D3) 125 MCG (5000 UT) CAPS Take by mouth.   hydrochlorothiazide (MICROZIDE) 12.5 MG capsule Take 1 capsule (12.5 mg total) by mouth daily.   lisinopril (ZESTRIL) 10 MG tablet Take 1 tablet by mouth once daily   Multiple Vitamins-Minerals (MULTIVITAMIN WITH MINERALS) tablet Take 1 tablet by mouth daily.   Pitavastatin Calcium (LIVALO) 2 MG TABS Take 1 tablet (2 mg total) by mouth 3 (three) times a week. Takes on Monday Wednesday and Friday   No facility-administered encounter medications on file as of 01/25/2020.    Current Diagnosis: Patient Active Problem List   Diagnosis Date Noted   Pure hypercholesterolemia 01/18/2020   Vitamin D deficiency 01/18/2020   Class 3 severe obesity due to excess calories with serious comorbidity and body mass index (BMI) of 40.0 to 44.9 in adult [name] 07/14/2019   Chronic renal disease, stage II 04/23/2018   Other abnormal glucose 04/23/2018   Benign hypertension with CKD (chronic kidney disease), stage II 11/27/2017   Hip pain 12/30/2014   Breast cancer of lower-outer quadrant of right female breast ([name]) 09/06/2014     Follow-Up:  Patient Assistance Coordination - Patient assistance renewal needed for Livalo 2 mg, patient takes one tablet three times a week, Monday, Wednesday and Friday. Form filled out and printed, awaiting signature from patient and MD.   02/18/2020- Patient called by Judithann Sheen, CPA to schedule an appointment to  sign Patient Assistant Forms. Patient states that she can come in on Sept 2, 2021 @ 12:30 am.  Patient does not need to bring proof of income, already in chart. Patient voiced understanding without questions to come in for appointment. Jannette Fogo, CPP notified.  Pattricia Boss, [place] Pharmacist Assistant [phone]

## 2020-02-01 ENCOUNTER — Telehealth: Payer: Self-pay | Admitting: *Deleted

## 2020-02-01 NOTE — Telephone Encounter (Signed)
Received call from [facility] on behalf of pt regarding the need for an attending physician statement.  Left a call back number of [phone].  Attempt x1 to return call, no answer, LVM.

## 2020-02-02 ENCOUNTER — Telehealth: Payer: Self-pay

## 2020-02-02 NOTE — Telephone Encounter (Signed)
-----   Message from Glendale Chard, MD sent at 02/02/2020 10:27 AM EDT ----- Faythe Ghee. Find out what she needs statement to say and write the letter so it can be ready Monday. Is it a letter that needs to be signed? Or is it paperwork that needs to be completed? Does Renae Gloss have the paperwork? For it to be day 9, this is the first I have heard of this.  ----- Message ----- From: Michelle Nasuti, CMA Sent: 02/02/2020   8:56 AM EDT To: Glendale Chard, MD  Ms. San said that it's because she is trying to change her life insurance policy and that she's needing the statement to get her money back from her old life Set designer. ----- Message ----- From: Glendale Chard, MD Sent: 02/02/2020   7:59 AM EDT To: Michelle Nasuti, CMA  What statement is needed? What is this for? No one has spoken to me about this.  ----- Message ----- From: Michelle Nasuti, CMA Sent: 02/01/2020  10:47 AM EDT To: Glendale Chard, MD  Pecola Lawless with [employer] called about a physician statement that needed to be completed and returned by next Monday for the pt and that it's urgent.  Ms Cassandria Anger said she spoke to medical records and was told 7 - 10 business days and that today is day 9.  Ms. Cassandria Anger was told that Dr. Baird Cancer is out of the office and I will make sure she gets the message when she returns on Monday. [phone], fax [phone].

## 2020-02-02 NOTE — Telephone Encounter (Signed)
I called and spoke with Deya a representative with [employer] [facility] and asked what is it that they are requesting for the pt and was told that they sent a medical records request and that they needed records from Dr. Nicholas Lose related to the pt's cancer.  Deya was told that that provider isn't in this office and that the pt and Pecola Lawless said that the pt needed a physician statement from Dr. Glendale Chard. Deya said no that they requested records from Dr. Nicholas Lose. I called and left the pt a message to call the office back.

## 2020-02-02 NOTE — Telephone Encounter (Signed)
-----   Message from Glendale Chard, MD sent at 02/02/2020  7:59 AM EDT ----- What statement is needed? What is this for? No one has spoken to me about this.  ----- Message ----- From: Michelle Nasuti, CMA Sent: 02/01/2020  10:47 AM EDT To: Glendale Chard, MD  Pecola Lawless with [employer] called about a physician statement that needed to be completed and returned by next Monday for the pt and that it's urgent.  Ms Cassandria Anger said she spoke to medical records and was told 7 - 10 business days and that today is day 9.  Ms. Cassandria Anger was told that Dr. Baird Cancer is out of the office and I will make sure she gets the message when she returns on Monday. [phone], fax [phone].

## 2020-02-07 ENCOUNTER — Telehealth: Payer: Self-pay

## 2020-02-07 NOTE — Telephone Encounter (Signed)
I notified the pt that I spoke to [facility] last Thursday and was told that they didn't need any records from Dr. Baird Cancer, they needed the records from the pt's cancer doctor, Dr. Lindi Adie.  The pt said yes that she spoke to them this morning and that she is going to sign a release form at Dr. Geralyn Flash office.  Cynthia Barton was also notified that I left a message for [facility] also.

## 2020-02-10 LAB — LIPID PANEL
Chol/HDL Ratio: 3.1 ratio (ref 0.0–4.4)
Cholesterol, Total: 169 mg/dL (ref 100–199)
HDL: 54 mg/dL (ref >39)
LDL Chol Calc (NIH): 99 mg/dL (ref 0–99)
Triglycerides: 84 mg/dL (ref 0–149)
VLDL Cholesterol Cal: 16 mg/dL (ref 5–40)

## 2020-02-10 LAB — SPECIMEN STATUS REPORT

## 2020-02-23 ENCOUNTER — Ambulatory Visit (INDEPENDENT_AMBULATORY_CARE_PROVIDER_SITE_OTHER): Payer: Medicare PPO | Admitting: Neurology

## 2020-02-23 ENCOUNTER — Encounter: Payer: Self-pay | Admitting: Neurology

## 2020-02-23 ENCOUNTER — Ambulatory Visit: Payer: Medicare PPO | Admitting: Neurology

## 2020-02-23 ENCOUNTER — Other Ambulatory Visit: Payer: Self-pay

## 2020-02-23 DIAGNOSIS — G5603 Carpal tunnel syndrome, bilateral upper limbs: Secondary | ICD-10-CM

## 2020-02-23 HISTORY — DX: Carpal tunnel syndrome, bilateral upper limbs: G56.03

## 2020-02-23 NOTE — Progress Notes (Signed)
Please refer to EMG and nerve conduction procedure note.  

## 2020-02-23 NOTE — Progress Notes (Signed)
[place]    Nerve / Sites Muscle Latency Ref. Amplitude Ref. Rel Amp Segments Distance Velocity Ref. Area    ms ms mV mV %  cm m/s m/s mVms  L Median - APB     Wrist APB 6.1 ?4.4 6.5 ?4.0 100 Wrist - APB 7   27.1     Upper arm APB 10.1  6.4  98.5 Upper arm - Wrist 20 51 ?49 28.0  R Median - APB     Wrist APB NR ?4.4 NR ?4.0 NR Wrist - APB 7   NR     Upper arm APB NR  NR  NR Upper arm - Wrist 21 NR ?49 NR  L Ulnar - ADM     Wrist ADM 2.8 ?3.3 11.5 ?6.0 100 Wrist - ADM 7   29.8     B.Elbow ADM 6.1  10.8  94.7 B.Elbow - Wrist 19 58 ?49 25.8     A.Elbow ADM 7.7  10.9  101 A.Elbow - B.Elbow 10 61 ?49 26.8  R Ulnar - ADM     Wrist ADM 2.7 ?3.3 11.0 ?6.0 100 Wrist - ADM 7   22.7     B.Elbow ADM 6.3  10.5  95.5 B.Elbow - Wrist 20 56 ?49 22.8     A.Elbow ADM 8.0  9.2  87 A.Elbow - B.Elbow 10 57 ?49 21.9             SNC    Nerve / Sites Rec. Site Peak Lat Ref.  Amp Ref. Segments Distance    ms ms V V  cm  L Median - Orthodromic (Dig II, Mid palm)     Dig II Wrist 4.3 ?3.4 10 ?10 Dig II - Wrist 13  R Median - Orthodromic (Dig II, Mid palm)     Dig II Wrist NR ?3.4 NR ?10 Dig II - Wrist 13  L Ulnar - Orthodromic, (Dig V, Mid palm)     Dig V Wrist 2.8 ?3.1 6 ?5 Dig V - Wrist 11  R Ulnar - Orthodromic, (Dig V, Mid palm)     Dig V Wrist 2.8 ?3.1 8 ?5 Dig V - Wrist 37             F  Wave    Nerve F Lat Ref.   ms ms  L Ulnar - ADM 26.1 ?32.0  R Ulnar - ADM 26.3 ?32.0

## 2020-02-23 NOTE — Procedures (Signed)
° ° ° °  HISTORY:  Cynthia Barton is a 74 year old patient with a 2-year history of numbness in the hands is worse in the evening hours.  She denies any neck pain or pain down the arms on either side.  She is being evaluated for a possible neuropathy or a cervical radiculopathy.   NERVE CONDUCTION STUDIES:  Nerve conduction studies were performed on both upper extremities.  The distal motor latency for the left median nerve was prolonged but with a normal motor amplitude.  No response was seen for the right median nerve.  The distal motor latencies and motor amplitudes for the ulnar nerves were normal bilaterally with normal nerve conduction velocities seen for the these nerves.  The nerve conduction velocity for the left median nerve was normal.  The sensory latencies for the median nerves were prolonged on the left and absent on the right and normal for the ulnar nerves bilaterally.  The ulnar F-wave latencies were within normal limits bilaterally.  EMG STUDIES:  EMG study was performed on the right upper extremity:  The first dorsal interosseous muscle reveals 2 to 4 K units with full recruitment. No fibrillations or positive waves were noted. The abductor pollicis brevis muscle reveals 2 to 4 K units with slightly reduced recruitment. No fibrillations or positive waves were noted. The extensor indicis proprius muscle reveals 1 to 3 K units with full recruitment. No fibrillations or positive waves were noted. The pronator teres muscle reveals 2 to 3 K units with full recruitment. No fibrillations or positive waves were noted. The biceps muscle reveals 1 to 2 K units with full recruitment. No fibrillations or positive waves were noted. The triceps muscle reveals 2 to 4 K units with full recruitment. No fibrillations or positive waves were noted. The anterior deltoid muscle reveals 2 to 3 K units with full recruitment. No fibrillations or positive waves were noted. The cervical paraspinal muscles  were tested at 2 levels. No abnormalities of insertional activity were seen at either level tested. There was poor relaxation.   IMPRESSION:  Nerve conduction studies done on both upper extremities shows evidence of bilateral carpal tunnel syndrome, more significant on the right than the left.  EMG evaluation of the right upper extremity shows no evidence of an overlying cervical radiculopathy.  Jill Alexanders MD 02/23/2020 2:15 PM  [facility] [address] [place], [place] [address]  Phone [phone] Fax [phone]

## 2020-02-24 ENCOUNTER — Other Ambulatory Visit: Payer: Self-pay

## 2020-02-24 ENCOUNTER — Ambulatory Visit: Payer: Self-pay

## 2020-02-24 DIAGNOSIS — E7800 Pure hypercholesterolemia, unspecified: Secondary | ICD-10-CM

## 2020-02-24 DIAGNOSIS — N182 Chronic kidney disease, stage 2 (mild): Secondary | ICD-10-CM

## 2020-02-24 DIAGNOSIS — I129 Hypertensive chronic kidney disease with stage 1 through stage 4 chronic kidney disease, or unspecified chronic kidney disease: Secondary | ICD-10-CM

## 2020-02-24 NOTE — Chronic Care Management (AMB) (Signed)
Chronic Care Management Pharmacy  Name: Cynthia Barton  MRN: [id number] DOB: 07-13-1945  Chief Complaint/ HPI  [employer],  74 y.o. , female presents for their Initial CCM visit with the clinical pharmacist In office.  PCP : Glendale Chard, MD  Their chronic conditions include: Hypertension, Hyperlipidemia and Vitamin D Deficiency  Office Visits: 01/18/20 OV: Annual exam. HTN check. HTN well controlled, continue current medications. EKG showed NSR without acute changes. HgbA1c 6.4% in prediabetic range. Increase exercise and limit sugary food and beverages. Kidney and liver function are normal. Calcium is high (pt reported taking calcium 1286m once daily). Referred to Neurology for bilateral upper extremity nerve conduction study for paresthesia of both hands. B12 level normal.  11/23/19 Telephone call: Pt notified samples of Livalo available at office for pickup.  09/21/19 Telephone call: Pt notified samples of Livalo available at office for pickup.  Consult Visits: 02/23/20 Neurology OV w/ Dr. WJannifer Franklin Presents for evaluation and nerve conduction study due to numbness in hands worsening in evening hours. Nerve conduction studies show evidence of bilateral carpal tunnel syndrome more significant on right than left. EMG showed no evidence of overlying cervical radiculopathy.   10/08/19 Hematology/Oncology OV w/ Dr. GLindi Adie History of right breast cancer treated with lumpectomy, radiation and now on anti-estrogen therapy with anastrozole. Anastrozole started 12/23/14. Discussed pro and cons of extended adjuvant therapy (7 years vs 5 years). Biggest risk would be worsening bone density. Continue calcium, vitamin D, and weight bearing exercises. Follow up in 1 year.  CCM Encounters:  Medications: Outpatient Encounter Medications as of 02/24/2020  Medication Sig  . anastrozole (ARIMIDEX) 1 MG tablet Take 1 tablet (1 mg total) by mouth daily.  . Calcium Carbonate-Vitamin D (CALCIUM 600+D) 600-400  MG-UNIT tablet Take 1 tablet by mouth every other day. 12078m . Cholecalciferol (VITAMIN D3) 125 MCG (5000 UT) CAPS Take by mouth.  . hydrochlorothiazide (MICROZIDE) 12.5 MG capsule Take 1 capsule (12.5 mg total) by mouth daily.  . Marland Kitchenisinopril (ZESTRIL) 10 MG tablet Take 1 tablet by mouth once daily  . Multiple Vitamins-Minerals (MULTIVITAMIN WITH MINERALS) tablet Take 1 tablet by mouth daily.  . Pitavastatin Calcium (LIVALO) 2 MG TABS Take 1 tablet (2 mg total) by mouth 3 (three) times a week. Takes on Monday Wednesday and Friday   No facility-administered encounter medications on file as of 02/24/2020.    Current Diagnosis/Assessment:  SDOH Interventions     Most Recent Value  SDOH Interventions  Financial Strain Interventions Other (Comment)  [Assisted with Livalo patient assistance program refills]      Goals Addressed            This Visit's Progress   . Pharmacy Care Plan       CARE PLAN ENTRY (see longitudinal plan of care for additional care plan information)  Current Barriers:  . Chronic Disease Management support, education, and care coordination needs related to Hypertension, Hyperlipidemia, Osteopenia, and Prediabetes   Hypertension BP Readings from Last 3 Encounters:  01/18/20 (!) 130/78  10/08/19 (!) 156/92  07/14/19 (!) 142/80   . Pharmacist Clinical Goal(s): o Over the next 180 days, patient will work with PharmD and providers to maintain BP goal <130/80 . Current regimen:  . Lisinopril 1064maily . Hydrochlorothiazide 12.5mg12mily . Interventions: o Provided dietary and exercise recommendations o Discussed appropriate goals for blood pressure (less than 130/80) . Patient self care activities - Over the next 180 days, patient will: o Check BP once weekly, document, and provide  at future appointments o Ensure daily salt intake < 2300 mg/day o Increase exercise to 30 minutes daily, 5 times per week  Hyperlipidemia Lab Results  Component Value Date/Time    LDLCALC 99 01/18/2020 09:59 AM   . Pharmacist Clinical Goal(s): o Over the next 180 days, patient will work with PharmD and providers to maintain LDL goal < 100 . Current regimen:  o Livalo 69m Monday, Wednesday, and Friday . Interventions: o Patient signed application for refills of Livalo and provided proof of income documents o Discussed cholesterol levels are at goal o Provided dietary and exercise recommendations . Patient self care activities - Over the next 180 days, patient will: o Continue Livalo three times weekly o Notify PharmD if any problems with Livalo patient assistance shipments o Increase exercise to 30 minutes daily, 5 times per week  Prediabetes Lab Results  Component Value Date/Time   HGBA1C 6.4 (H) 01/18/2020 09:59 AM   HGBA1C 6.1 (H) 07/14/2019 09:37 AM   HGBA1C 6.1 11/27/2017 12:00 AM   . Pharmacist Clinical Goal(s): o Over the next 180 days, patient will work with PharmD and providers to maintain A1c goal <6.5% . Current regimen:  o N/A . Interventions: o Provided dietary and exercise recommendations o Discussed appropriate goals for Hemoglobin A1c (<5.7% normal, 5.7-6.4% prediabetes) o Patient education regarding the importance of diet and exercise to lower HgbA1c . Patient self care activities - Over the next 180 days, patient will: o Check blood sugar if symptomatic, document, and provide at future appointments o Contact provider with any episodes of hypoglycemia o Increase exercise to 30 minutes daily, 5 times per week  Osteopenia . Pharmacist Clinical Goal(s) o Over the next 90 days, patient will work with PharmD and providers to build and maintain bone density . Current regimen:  . Cholecalciferol 5000 units daily . Calcium carbonate/Vitamin D 12064m400 IU every other day . Interventions: o Discussed patient's elevated calcium level at last office visit o Determined patient started taking calcium 120060mupplement every other day instead  of daily o Provided patient education regarding calcium content in foods o Discussed 1200m107mtal daily calcium intake recommended from diet and supplements o Discussed the importance of weight bearing exercises three times weekly . Patient self care activities - Over the next 90 days, patient will: o Determine how much calcium she is getting through dietary sources daily o Review calcium content in multivitamin o Begin weight bearing exercises three days per week  Medication management . Pharmacist Clinical Goal(s): o Over the next 180 days, patient will work with PharmD and providers to maintain optimal medication adherence . Current pharmacy: [facility] . Interventions o Comprehensive medication review performed. o Continue current medication management strategy . Patient self care activities - Over the next 180 days, patient will: o Focus on medication adherence by using a pill box o Take medications as prescribed o Report any questions or concerns to PharmD and/or provider(s)  Initial goal documentation        Prediabetes   A1c goal <6.5%  Recent Relevant Labs: Lab Results  Component Value Date/Time   HGBA1C 6.4 (H) 01/18/2020 09:59 AM   HGBA1C 6.1 (H) 07/14/2019 09:37 AM   HGBA1C 6.1 11/27/2017 12:00 AM   MICROALBUR 30 07/14/2019 10:49 AM   MICROALBUR 10 01/12/2019 11:11 AM    Last diabetic Eye exam: No results found for: HMDIABEYEEXA  Last diabetic Foot exam: No results found for: HMDIABFOOTEX   Checking BG: Never  Recent FBG Readings:  Recent  pre-meal BG readings:  Recent 2hr PP BG readings:   Recent HS BG readings:   Patient has failed these meds in past: N/A Patient is currently controlled on the following medications: . N/A  We discussed:  . Diet extensively o Pt reports typically eating cheerios with a banana and yogurt for breakfast o She also mentioned eating baked and fried chicken, string beans, creamed potatoes, collards, cabbage, fried okra,  fried fish, bakes potato, cole slaw, and hush puppies o Recommend pt focus on eating well-balanced meals and limit carbohydrates o Pt states she does not cook much, eats out at [employer], [employer], [employer], etc o She drinks 4-5 bottles of water daily o Does not drink sodas . Exercise extensively o Pt was going to the gym 4-5 times weekly before COVID o Now pt is walking down the street to the park up to 3 times weekly (for 15-20 minutes each time) o Recommend pt get 30 minutes of moderate intensity exercise daily 5 times a week (150 minutes total per week) . What HgbA1c is and that her goal she be to get back down to normal level (<5.7%) . The importance of diet and exercise to reduce A1c  Plan Continue control with diet and exercise   Hyperlipidemia   LDL goal < 100  Lipid Panel     Component Value Date/Time   CHOL 169 01/18/2020 0959   TRIG 84 01/18/2020 0959   HDL 54 01/18/2020 0959   LDLCALC 99 01/18/2020 0959    Hepatic Function Latest Ref Rng & Units 01/18/2020 07/14/2019 01/12/2019  Total Protein 6.0 - 8.5 g/dL 7.2 7.1 7.5  Albumin 3.7 - 4.7 g/dL 4.2 4.0 4.3  AST 0 - 40 IU/L '28 25 26  ' ALT 0 - 32 IU/L '22 20 20  ' Alk Phosphatase 48 - 121 IU/L 63 66 65  Total Bilirubin 0.0 - 1.2 mg/dL 0.4 0.4 0.5     The 10-year ASCVD risk score Mikey Bussing DC Jr., et al., 2013) is: 13.7%   Values used to calculate the score:     Age: 76 years     Sex: Female     Is Non-Hispanic African American: Yes     Diabetic: No     Tobacco smoker: No     Systolic Blood Pressure: 401 mmHg     Is BP treated: No     HDL Cholesterol: 54 mg/dL     Total Cholesterol: 169 mg/dL   Patient has failed these meds in past: Welchol, Zetia,  Patient is currently controlled on the following medications:  . Livalo 23m Monday, Wednesday, and Friday  We discussed:   . Pt signed application for refills for Livalo patient assistance program o She also provided proof of income . She has already been approved and received 1  shipment from the patient assistance program  Plan Continue current medications   Hypertension   BP goal is:  <130/80  Office blood pressures are  BP Readings from Last 3 Encounters:  01/18/20 (!) 130/78  10/08/19 (!) 156/92  07/14/19 (!) 142/80   Patient checks BP at home infrequently (if she feels bad) Patient home BP readings are ranging: None to provide today  Patient has failed these meds in the past: N/A Patient is currently controlled on the following medications:  . Lisinopril 133mdaily . Hydrochlorothiazide 12.60m49maily  We discussed: . GMarland Kitchenal BP <130/80 . Pt says that when she checks BP at home it is always good  . Denies headaches or  dizziness . Recommend pt check BP once a week and record  Plan Continue current medications   Osteopenia / Osteoporosis   Last DEXA Scan: 04/07/19  T-Score femoral neck: -1.9  T-Score total hip:   T-Score lumbar spine:   T-Score forearm radius:   10-year probability of major osteoporotic fracture:   10-year probability of hip fracture:   Vit D, 25-Hydroxy  Date Value Ref Range Status  01/18/2020 64.1 30.0 - 100.0 ng/mL Final    Comment:    Vitamin D deficiency has been defined by the Institute of Medicine and an Endocrine Society practice guideline as a level of serum 25-OH vitamin D less than 20 ng/mL (1,2). The Endocrine Society went on to further define vitamin D insufficiency as a level between 21 and 29 ng/mL (2). 1. IOM (Institute of Medicine). 2010. Dietary reference    intakes for calcium and D. [place]: The    [employer]. 2. Holick MF, Binkley [place], Bischoff-Ferrari HA, et al.    Evaluation, treatment, and prevention of vitamin D    deficiency: an Endocrine Society clinical practice    guideline. JCEM. 2011 Jul; 96(7):1911-30.     Patient is not a candidate for pharmacologic treatment  Patient has failed these meds in past: N/A Patient is currently controlled on the following medications:   . Cholecalciferol 5000 units daily . Calcium carbonate/Vitamin D 126m/400 IU every other day  We discussed:  Recommend weight-bearing and muscle strengthening exercises for building and maintaining bone density.   Pt has hand weights at home she can use  Pt's calcium was elevated at last office visit (11.3) so she started taking her 12030mcalcium supplement twice weekly (Monday and Friday) instead of daily  Discussed that 120058ms the recommended amount of calcium per day including supplements and diet  Advised pt that I will send her dietary sources of calcium to help her determine how much calcium she is getting through her diet  Depending on how much calcium she is getting through her diet she may be able to take a lower calcium dose (600m67mily) or none at all  Importance of calcium, Vitamin D, and weight- bearing exercises to strengthen bones due to long term anastrozole  Plan Continue current medications and control with diet and exercise  Health Maintenance   Patient is currently on the following medications:  . MuMarland Kitchentivitamin daily . Anastrozole 1mg 80mly  We discussed:  Pt taking anastrozole for history of breast cancer/breast cancer prevention  Therapy has been extended from 5 years to 7 years  Plan Continue current medications   Vaccines   Reviewed and discussed patient's vaccination history.    Immunization History  Administered Date(s) Administered  . Influenza, High Dose Seasonal PF 04/23/2018, 03/22/2019  . PFIZER SARS-COV-2 Vaccination 07/30/2019, 08/20/2019  . Zoster Recombinat (Shingrix) 02/15/2019, 04/16/2019   Plan Review and discuss at follow up  Medication Management   [facility] Uses pill box? No - But pt says she will start using Pt endorses 100% compliance  We discussed:  . Importance of taking each medications daily as directed . Medications are affordable except for Livalo . Takes all  medications in the morning when she eats (by 8am)  Plan Continue current medication management strategy   Follow up: 3 month phone visit  CourtJannette FogormD Clinical Pharmacist [facility] [phone]

## 2020-02-25 NOTE — Patient Instructions (Addendum)
Visit Information  Goals Addressed            This Visit's Progress   . Pharmacy Care Plan       CARE PLAN ENTRY (see longitudinal plan of care for additional care plan information)  Current Barriers:  . Chronic Disease Management support, education, and care coordination needs related to Hypertension, Hyperlipidemia, Osteopenia, and Prediabetes   Hypertension BP Readings from Last 3 Encounters:  01/18/20 (!) 130/78  10/08/19 (!) 156/92  07/14/19 (!) 142/80   . Pharmacist Clinical Goal(s): o Over the next 180 days, patient will work with PharmD and providers to maintain BP goal <130/80 . Current regimen:  . Lisinopril 10mg  daily . Hydrochlorothiazide 12.5mg  daily . Interventions: o Provided dietary and exercise recommendations o Discussed appropriate goals for blood pressure (less than 130/80) . Patient self care activities - Over the next 180 days, patient will: o Check BP once weekly, document, and provide at future appointments o Ensure daily salt intake < 2300 mg/day o Increase exercise to 30 minutes daily, 5 times per week  Hyperlipidemia Lab Results  Component Value Date/Time   LDLCALC 99 01/18/2020 09:59 AM   . Pharmacist Clinical Goal(s): o Over the next 180 days, patient will work with PharmD and providers to maintain LDL goal < 100 . Current regimen:  o Livalo 2mg  Monday, Wednesday, and Friday . Interventions: o Patient signed application for refills of Livalo and provided proof of income documents o Discussed cholesterol levels are at goal o Provided dietary and exercise recommendations . Patient self care activities - Over the next 180 days, patient will: o Continue Livalo three times weekly o Notify PharmD if any problems with Livalo patient assistance shipments o Increase exercise to 30 minutes daily, 5 times per week  Prediabetes Lab Results  Component Value Date/Time   HGBA1C 6.4 (H) 01/18/2020 09:59 AM   HGBA1C 6.1 (H) 07/14/2019 09:37 AM    HGBA1C 6.1 11/27/2017 12:00 AM   . Pharmacist Clinical Goal(s): o Over the next 180 days, patient will work with PharmD and providers to maintain A1c goal <6.5% . Current regimen:  o N/A . Interventions: o Provided dietary and exercise recommendations o Discussed appropriate goals for Hemoglobin A1c (<5.7% normal, 5.7-6.4% prediabetes) o Patient education regarding the importance of diet and exercise to lower HgbA1c . Patient self care activities - Over the next 180 days, patient will: o Check blood sugar if symptomatic, document, and provide at future appointments o Contact provider with any episodes of hypoglycemia o Increase exercise to 30 minutes daily, 5 times per week  Osteopenia . Pharmacist Clinical Goal(s) o Over the next 90 days, patient will work with PharmD and providers to build and maintain bone density . Current regimen:  . Cholecalciferol 5000 units daily . Calcium carbonate/Vitamin D 1200mg /400 IU every other day . Interventions: o Discussed patient's elevated calcium level at last office visit o Determined patient started taking calcium 1200mg  supplement every other day instead of daily o Provided patient education regarding calcium content in foods o Discussed 1200mg  total daily calcium intake recommended from diet and supplements o Discussed the importance of weight bearing exercises three times weekly . Patient self care activities - Over the next 90 days, patient will: o Determine how much calcium she is getting through dietary sources daily o Review calcium content in multivitamin o Begin weight bearing exercises three days per week  Medication management . Pharmacist Clinical Goal(s): o Over the next 180 days, patient will work with  PharmD and providers to maintain optimal medication adherence . Current pharmacy: [facility] . Interventions o Comprehensive medication review performed. o Continue current medication management strategy . Patient self care  activities - Over the next 180 days, patient will: o Focus on medication adherence by using a pill box o Take medications as prescribed o Report any questions or concerns to PharmD and/or provider(s)  Initial goal documentation        Cynthia Barton was given information about Chronic Care Management services today including:  1. CCM service includes personalized support from designated clinical staff supervised by her physician, including individualized plan of care and coordination with other care providers 2. 24/7 contact phone numbers for assistance for urgent and routine care needs. 3. Standard insurance, coinsurance, copays and deductibles apply for chronic care management only during months in which we provide at least 20 minutes of these services. Most insurances cover these services at 100%, however patients may be responsible for any copay, coinsurance and/or deductible if applicable. This service may help you avoid the need for more expensive face-to-face services. 4. Only one practitioner may furnish and bill the service in a calendar month. 5. The patient may stop CCM services at any time (effective at the end of the month) by phone call to the office staff.  Patient agreed to services and verbal consent obtained.   The patient verbalized understanding of instructions provided today and agreed to receive a mailed copy of patient instruction and/or educational materials. Telephone follow up appointment with pharmacy team member scheduled for: 05/31/20 @ 3:30 PM  Jannette Fogo, PharmD Clinical Pharmacist [facility] [phone]   Calcium Content in Foods Calcium is the most abundant mineral in your body. Most of your body's calcium supply is stored in your bones and teeth. Calcium helps many parts of the body function normally, including:  Blood and blood vessels.  Nerves.  Hormones.  Muscles.  Bones and teeth. When your calcium stores are  low, you may be at risk for low bone mass, bone loss, and broken bones (fractures). When you get enough calcium, it helps to support strong bones and teeth throughout your life. Calcium is especially important for:  Children during growth spurts.  Girls during adolescence.  Women who are pregnant or breastfeeding.  Women after their menstrual cycle stops (postmenopause).  Women whose menstrual cycle has stopped due to anorexia nervosa or regular intense exercise.  People who cannot eat or digest dairy products.  Vegans. What are tips for getting more calcium? General information  Try to get most of your calcium from food. Eat foods that are high in calcium.  Some people may benefit from taking calcium supplements. Check with your health care provider or diet and nutrition specialist (dietitian) before starting any calcium supplements. Calcium supplements may interact with certain medicines. Too much calcium may cause other health problems, like constipation and kidney stones.  For the body to absorb calcium, it needs vitamin D. Sources of vitamin D include: ? Skin exposure to direct sunlight. ? Foods, such as egg yolks, liver, saltwater fish, and fortified milk. ? Vitamin D supplements. Check with your health care provider or dietitian before starting any vitamin D supplements. What foods are high in calcium?  High-calcium foods are those that contain more than 100 milligrams (mg) of calcium per serving. Fruits  Fortified orange or other fruit juice, 300 mg per 8 oz serving. Vegetables  Collard greens, 360 mg per 8 oz serving.  Kale, 180 mg  per 8 oz serving.  Bok choy, 160 mg per 8 oz serving. Grains  Fortified ready-to-eat cereals, 100-1,000 mg per 8 oz serving.  Fortified frozen waffles, 200 mg in two waffles. Meats and other proteins  Sardines, canned with bones, 325 mg per 3 oz serving.  Salmon, canned with bones, 180 mg per 3 oz serving.  Canned shrimp, 125 mg  per 3 oz serving.  Baked beans, 160 mg per 4 oz serving. Dairy  Yogurt, plain, low-fat, 310 mg per 6 oz serving.  Milk, 300 mg per 8 oz serving.  American cheese, 195 mg per 1 oz serving.  Cheddar cheese, 205 mg per 1 oz serving.  Cottage cheese 2%, 105 mg per 4 oz serving.  Fortified soy, rice, or almond milk, 300 mg per 8 oz serving. The items listed above may not be a complete list of foods high in calcium. Actual amounts of calcium may be different depending on processing. Contact a dietitian for more information. What foods are lower in calcium? Foods lower in calcium are those that contain 50 mg of calcium or less per serving. Fruits  Apple, about 6 mg in one apple.  Banana, about 12 mg in one banana. Vegetables  Lettuce, 19 mg per 2 oz serving.  Tomato, about 11 mg in one tomato. Grains  Rice, 4 mg per 6 oz serving.  Boiled potatoes, 14 mg per 8 oz serving.  White bread, 6 mg in one slice. Meats and other proteins  Egg, 27 mg per 2 oz serving.  Red meat, 7 mg per 4 oz serving.  Chicken, 17 mg per 4 oz serving.  Fish, cod or trout, 20 mg per 4 oz serving. The items listed above may not be a complete list of foods lower in calcium. Actual amounts of calcium may be different depending on processing. Contact a dietitian for more information. Summary  Calcium is an important mineral in the body because it affects many functions. Getting enough calcium helps support strong bones and teeth throughout your life.  Try to get most of your calcium from food.  Calcium supplements may interact with certain medicines. Check with your health care provider before starting any calcium supplements. This information is not intended to replace advice given to you by your health care provider. Make sure you discuss any questions you have with your health care provider. Document Revised: 06/03/2017 Document Reviewed: 06/03/2017 Elsevier Patient Education  2020 [employer].  Prediabetes Eating Plan Prediabetes is a condition that causes blood sugar (glucose) levels to be higher than normal. This increases the risk for developing diabetes. In order to prevent diabetes from developing, your health care provider may recommend a diet and other lifestyle changes to help you:  Control your blood glucose levels.  Improve your cholesterol levels.  Manage your blood pressure. Your health care provider may recommend working with a diet and nutrition specialist (dietitian) to make a meal plan that is best for you. What are tips for following this plan? Lifestyle  Set weight loss goals with the help of your health care team. It is recommended that most people with prediabetes lose 7% of their current body weight.  Exercise for at least 30 minutes at least 5 days a week.  Attend a support group or seek ongoing support from a mental health counselor.  Take over-the-counter and prescription medicines only as told by your health care provider. Reading food labels  Read food labels to check the amount of fat, salt (  sodium), and sugar in prepackaged foods. Avoid foods that have: ? Saturated fats. ? Trans fats. ? Added sugars.  Avoid foods that have more than 300 milligrams (mg) of sodium per serving. Limit your daily sodium intake to less than 2,300 mg each day. Shopping  Avoid buying pre-made and processed foods. Cooking  Cook with olive oil. Do not use butter, lard, or ghee.  Bake, broil, grill, or boil foods. Avoid frying. Meal planning   Work with your dietitian to develop an eating plan that is right for you. This may include: ? Tracking how many calories you take in. Use a food diary, notebook, or mobile application to track what you eat at each meal. ? Using the glycemic index (GI) to plan your meals. The index tells you how quickly a food will raise your blood glucose. Choose low-GI foods. These foods take a longer time to raise blood  glucose.  Consider following a Mediterranean diet. This diet includes: ? Several servings each day of fresh fruits and vegetables. ? Eating fish at least twice a week. ? Several servings each day of whole grains, beans, nuts, and seeds. ? Using olive oil instead of other fats. ? Moderate alcohol consumption. ? Eating small amounts of red meat and whole-fat dairy.  If you have high blood pressure, you may need to limit your sodium intake or follow a diet such as the DASH eating plan. DASH is an eating plan that aims to lower high blood pressure. What foods are recommended? The items listed below may not be a complete list. Talk with your dietitian about what dietary choices are best for you. Grains Whole grains, such as whole-wheat or whole-grain breads, crackers, cereals, and pasta. Unsweetened oatmeal. Bulgur. Barley. Quinoa. Brown rice. Corn or whole-wheat flour tortillas or taco shells. Vegetables Lettuce. Spinach. Peas. Beets. Cauliflower. Cabbage. Broccoli. Carrots. Tomatoes. Squash. Eggplant. Herbs. Peppers. Onions. Cucumbers. Brussels sprouts. Fruits Berries. Bananas. Apples. Oranges. Grapes. Papaya. Mango. Pomegranate. Kiwi. Grapefruit. Cherries. Meats and other protein foods Seafood. Poultry without skin. Lean cuts of pork and beef. Tofu. Eggs. Nuts. Beans. Dairy Low-fat or fat-free dairy products, such as yogurt, cottage cheese, and cheese. Beverages Water. Tea. Coffee. Sugar-free or diet soda. Seltzer water. Lowfat or no-fat milk. Milk alternatives, such as soy or almond milk. Fats and oils Olive oil. Canola oil. Sunflower oil. Grapeseed oil. Avocado. Walnuts. Sweets and desserts Sugar-free or low-fat pudding. Sugar-free or low-fat ice cream and other frozen treats. Seasoning and other foods Herbs. Sodium-free spices. Mustard. Relish. Low-fat, low-sugar ketchup. Low-fat, low-sugar barbecue sauce. Low-fat or fat-free mayonnaise. What foods are not recommended? The items  listed below may not be a complete list. Talk with your dietitian about what dietary choices are best for you. Grains Refined white flour and flour products, such as bread, pasta, snack foods, and cereals. Vegetables Canned vegetables. Frozen vegetables with butter or cream sauce. Fruits Fruits canned with syrup. Meats and other protein foods Fatty cuts of meat. Poultry with skin. Breaded or fried meat. Processed meats. Dairy Full-fat yogurt, cheese, or milk. Beverages Sweetened drinks, such as sweet iced tea and soda. Fats and oils Butter. Lard. Ghee. Sweets and desserts Baked goods, such as cake, cupcakes, pastries, cookies, and cheesecake. Seasoning and other foods Spice mixes with added salt. Ketchup. Barbecue sauce. Mayonnaise. Summary  To prevent diabetes from developing, you may need to make diet and other lifestyle changes to help control blood sugar, improve cholesterol levels, and manage your blood pressure.  Set weight loss  goals with the help of your health care team. It is recommended that most people with prediabetes lose 7 percent of their current body weight.  Consider following a Mediterranean diet that includes plenty of fresh fruits and vegetables, whole grains, beans, nuts, seeds, fish, lean meat, low-fat dairy, and healthy oils. This information is not intended to replace advice given to you by your health care provider. Make sure you discuss any questions you have with your health care provider. Document Revised: 10/02/2018 Document Reviewed: 08/14/2016 Elsevier Patient Education  2020 [employer].

## 2020-03-15 ENCOUNTER — Telehealth: Payer: Self-pay | Admitting: *Deleted

## 2020-03-15 NOTE — Telephone Encounter (Signed)
Pt and daughter called needing physician statements sent for life insurance purposes. Medical release form was faxed and form placed in HIM box. Family has been notified. Advised to give at least 7-10 business days for delivery. Pt daughter verbalized understanding.

## 2020-03-19 ENCOUNTER — Other Ambulatory Visit: Payer: Self-pay | Admitting: Internal Medicine

## 2020-03-20 ENCOUNTER — Telehealth: Payer: Self-pay

## 2020-03-20 DIAGNOSIS — G5600 Carpal tunnel syndrome, unspecified upper limb: Secondary | ICD-10-CM

## 2020-03-20 NOTE — Telephone Encounter (Signed)
-----   Message from Glendale Chard, MD sent at 03/18/2020  4:46 PM EDT ----- Is she aware of nerve conduction study results? She has b/l carpal tunnel. Does she wish to see hand specialist? If yes, please refer her to Dr. Amedeo Plenty, dx: Carpal tunnel syndrome

## 2020-03-20 NOTE — Telephone Encounter (Signed)
The pt said no she wasn't aware of the results and yes she would like to see the hand specialist. Referral placed.

## 2020-04-19 ENCOUNTER — Telehealth: Payer: Self-pay | Admitting: Pharmacist

## 2020-04-19 NOTE — Chronic Care Management (AMB) (Signed)
Chronic Care Management Pharmacy Assistant   Name: Cynthia Barton  MRN: [id number] DOB: [birth year]  Reason for Encounter: Disease State - Hypertension Adherence Call.   Patient Questions:  1.  Have you seen any other providers since your last visit? No   2.  Any changes in your medicines or health? No    PCP : Glendale Chard, MD  Allergies:  No Known Allergies  Medications: Outpatient Encounter Medications as of 04/19/2020  Medication Sig  . anastrozole (ARIMIDEX) 1 MG tablet Take 1 tablet (1 mg total) by mouth daily.  . Calcium Carbonate-Vitamin D (CALCIUM 600+D) 600-400 MG-UNIT tablet Take 1 tablet by mouth every other day. 1200mg   . Cholecalciferol (VITAMIN D3) 125 MCG (5000 UT) CAPS Take by mouth.  . hydrochlorothiazide (MICROZIDE) 12.5 MG capsule Take 1 capsule (12.5 mg total) by mouth daily.  Marland Kitchen lisinopril (ZESTRIL) 10 MG tablet Take 1 tablet by mouth once daily  . Multiple Vitamins-Minerals (MULTIVITAMIN WITH MINERALS) tablet Take 1 tablet by mouth daily.  . Pitavastatin Calcium (LIVALO) 2 MG TABS Take 1 tablet (2 mg total) by mouth 3 (three) times a week. Takes on Monday Wednesday and Friday   No facility-administered encounter medications on file as of 04/19/2020.    Current Diagnosis: Patient Active Problem List   Diagnosis Date Noted  . Bilateral carpal tunnel syndrome 02/23/2020  . Pure hypercholesterolemia 01/18/2020  . Vitamin D deficiency 01/18/2020  . Class 3 severe obesity due to excess calories with serious comorbidity and body mass index (BMI) of 40.0 to 44.9 in adult ([place]) 07/14/2019  . Chronic renal disease, stage II 04/23/2018  . Other abnormal glucose 04/23/2018  . Benign hypertension with CKD (chronic kidney disease), stage II 11/27/2017  . Hip pain 12/30/2014  . Breast cancer of lower-outer quadrant of right female breast ([place]) 09/06/2014     Recent Office Vitals: BP Readings from Last 3 Encounters:  01/18/20 (!) 130/78  10/08/19 (!) 156/92    07/14/19 (!) 142/80   Pulse Readings from Last 3 Encounters:  01/18/20 78  10/08/19 87  07/14/19 96    Wt Readings from Last 3 Encounters:  01/18/20 202 lb 6.4 oz (91.8 kg)  10/08/19 210 lb 9.6 oz (95.5 kg)  07/14/19 207 lb 12.8 oz (94.3 kg)     Kidney Function Lab Results  Component Value Date/Time   CREATININE 0.80 01/18/2020 09:59 AM   CREATININE 0.86 07/14/2019 09:37 AM   CREATININE 0.8 12/22/2014 01:44 PM   CREATININE 0.9 09/07/2014 12:37 PM   GFRNONAA 73 01/18/2020 09:59 AM   GFRAA 84 01/18/2020 09:59 AM    BMP Latest Ref Rng & Units 01/18/2020 07/14/2019 01/12/2019  Glucose 65 - 99 mg/dL 109(H) 111(H) 109(H)  BUN 8 - 27 mg/dL 14 12 12   Creatinine 0.57 - 1.00 mg/dL 0.80 0.86 0.87  BUN/Creat Ratio 12 - 28 18 14 14   Sodium 134 - 144 mmol/L 138 140 137  Potassium 3.5 - 5.2 mmol/L 4.1 4.2 3.8  Chloride 96 - 106 mmol/L 101 103 98  CO2 20 - 29 mmol/L 25 25 23   Calcium 8.7 - 10.3 mg/dL 11.3(H) 10.9(H) 10.6(H)    Reviewed chart prior to disease state call. Spoke with patient regarding BP  Recent Office Vitals: BP Readings from Last 3 Encounters:  01/18/20 (!) 130/78  10/08/19 (!) 156/92  07/14/19 (!) 142/80   Pulse Readings from Last 3 Encounters:  01/18/20 78  10/08/19 87  07/14/19 96    Wt Readings from Last 3  Encounters:  01/18/20 202 lb 6.4 oz (91.8 kg)  10/08/19 210 lb 9.6 oz (95.5 kg)  07/14/19 207 lb 12.8 oz (94.3 kg)     Kidney Function Lab Results  Component Value Date/Time   CREATININE 0.80 01/18/2020 09:59 AM   CREATININE 0.86 07/14/2019 09:37 AM   CREATININE 0.8 12/22/2014 01:44 PM   CREATININE 0.9 09/07/2014 12:37 PM   GFRNONAA 73 01/18/2020 09:59 AM   GFRAA 84 01/18/2020 09:59 AM    BMP Latest Ref Rng & Units 01/18/2020 07/14/2019 01/12/2019  Glucose 65 - 99 mg/dL 109(H) 111(H) 109(H)  BUN 8 - 27 mg/dL 14 12 12   Creatinine 0.57 - 1.00 mg/dL 0.80 0.86 0.87  BUN/Creat Ratio 12 - 28 18 14 14   Sodium 134 - 144 mmol/L 138 140 137  Potassium  3.5 - 5.2 mmol/L 4.1 4.2 3.8  Chloride 96 - 106 mmol/L 101 103 98  CO2 20 - 29 mmol/L 25 25 23   Calcium 8.7 - 10.3 mg/dL 11.3(H) 10.9(H) 10.6(H)    . Current antihypertensive regimen:  o Lisinopril 10 mg one tablet a day o Hydrochlorothiazide 12.5 mg one a day  . How often are you checking your Blood Pressure? when feeling symptomatic   . Current home BP readings: none  . What recent interventions/DTPs have been made by any provider to improve Blood Pressure control since last CPP Visit: Patent states she takes medications as directed by provider.  . Any recent hospitalizations or ED visits since last visit with CPP? No   . What diet changes have been made to improve Blood Pressure Control?  o Patient states she eats what she wants , but she states that she eats healthy  . What exercise is being done to improve your Blood Pressure Control?  o Patient states she has no exercise regimen, but she is active.   Adherence Review: Is the patient currently on ACE/ARB medication?  Yes. Does the patient have >5 day gap between last estimated fill dates? No       Lipid Panel    Component Value Date/Time   CHOL 169 01/18/2020 0959   TRIG 84 01/18/2020 0959   HDL 54 01/18/2020 0959   LDLCALC 99 01/18/2020 0959      Follow-Up:  Pharmacist Review -  Reviewed Chart and Adherence measures. Per [employer], Medication Adherence for hypertension 100% med compliance.  Beverly Milch CPP - Notified   Judithann Sheen, [facility] Clinical Pharmacist Assistant [phone]

## 2020-05-03 ENCOUNTER — Telehealth: Payer: Self-pay

## 2020-05-03 NOTE — Telephone Encounter (Signed)
The pt was given the information for her referral to [facility]  [phone]

## 2020-05-16 ENCOUNTER — Other Ambulatory Visit: Payer: Self-pay | Admitting: Internal Medicine

## 2020-05-17 ENCOUNTER — Other Ambulatory Visit: Payer: Self-pay

## 2020-05-17 MED ORDER — LIVALO 2 MG PO TABS
2.0000 mg | ORAL_TABLET | ORAL | 3 refills | Status: DC
Start: 2020-05-17 — End: 2021-07-31

## 2020-05-29 ENCOUNTER — Telehealth: Payer: Self-pay

## 2020-05-29 NOTE — Telephone Encounter (Signed)
Returned pt call she was asking if we were giving the booster here at the office. Pt had pfizer LVM making pt aware we are currently just giving [facility]

## 2020-05-31 ENCOUNTER — Telehealth: Payer: Self-pay

## 2020-05-31 NOTE — Chronic Care Management (AMB) (Deleted)
Chronic Care Management Pharmacy  Name: Cynthia Barton  MRN: [id number] DOB: [birth year]  Chief Complaint/ HPI  [employer],  74 y.o. , female presents for their Initial CCM visit with the clinical pharmacist In office.  PCP : Glendale Chard, MD  Their chronic conditions include: Hypertension, Hyperlipidemia and Vitamin D Deficiency  Office Visits: 01/18/20 OV: Annual exam. HTN check. HTN well controlled, continue current medications. EKG showed NSR without acute changes. HgbA1c 6.4% in prediabetic range. Increase exercise and limit sugary food and beverages. Kidney and liver function are normal. Calcium is high (pt reported taking calcium 1235m once daily). Referred to Neurology for bilateral upper extremity nerve conduction study for paresthesia of both hands. B12 level normal.  11/23/19 Telephone call: Pt notified samples of Livalo available at office for pickup.  09/21/19 Telephone call: Pt notified samples of Livalo available at office for pickup.  Consult Visits: 02/23/20 Neurology OV w/ Dr. WJannifer Franklin Presents for evaluation and nerve conduction study due to numbness in hands worsening in evening hours. Nerve conduction studies show evidence of bilateral carpal tunnel syndrome more significant on right than left. EMG showed no evidence of overlying cervical radiculopathy.   10/08/19 Hematology/Oncology OV w/ Dr. GLindi Adie History of right breast cancer treated with lumpectomy, radiation and now on anti-estrogen therapy with anastrozole. Anastrozole started 12/23/14. Discussed pro and cons of extended adjuvant therapy (7 years vs 5 years). Biggest risk would be worsening bone density. Continue calcium, vitamin D, and weight bearing exercises. Follow up in 1 year.  CCM Encounters:  Medications: Outpatient Encounter Medications as of 05/31/2020  Medication Sig  . anastrozole (ARIMIDEX) 1 MG tablet Take 1 tablet (1 mg total) by mouth daily.  . Calcium Carbonate-Vitamin D (CALCIUM 600+D) 600-400  MG-UNIT tablet Take 1 tablet by mouth every other day. 12031m . Cholecalciferol (VITAMIN D3) 125 MCG (5000 UT) CAPS Take by mouth.  . hydrochlorothiazide (MICROZIDE) 12.5 MG capsule Take 1 capsule by mouth once daily  . lisinopril (ZESTRIL) 10 MG tablet Take 1 tablet by mouth once daily  . Multiple Vitamins-Minerals (MULTIVITAMIN WITH MINERALS) tablet Take 1 tablet by mouth daily.  . Pitavastatin Calcium (LIVALO) 2 MG TABS Take 1 tablet (2 mg total) by mouth 3 (three) times a week. Takes on Monday Wednesday and Friday   No facility-administered encounter medications on file as of 05/31/2020.    Current Diagnosis/Assessment:    Goals Addressed   None     Prediabetes   A1c goal <6.5%  Recent Relevant Labs: Lab Results  Component Value Date/Time   HGBA1C 6.4 (H) 01/18/2020 09:59 AM   HGBA1C 6.1 (H) 07/14/2019 09:37 AM   HGBA1C 6.1 11/27/2017 12:00 AM   MICROALBUR 30 07/14/2019 10:49 AM   MICROALBUR 10 01/12/2019 11:11 AM    Last diabetic Eye exam: No results found for: HMDIABEYEEXA  Last diabetic Foot exam: No results found for: HMDIABFOOTEX   Checking BG: Never  Recent FBG Readings:  Recent pre-meal BG readings:  Recent 2hr PP BG readings:   Recent HS BG readings:   Patient has failed these meds in past: N/A Patient is currently controlled on the following medications: . N/A  We discussed:  . Diet extensively o Pt reports typically eating cheerios with a banana and yogurt for breakfast o She also mentioned eating baked and fried chicken, string beans, creamed potatoes, collards, cabbage, fried okra, fried fish, bakes potato, cole slaw, and hush puppies o Recommend pt focus on eating well-balanced meals and limit carbohydrates o Pt  states she does not cook much, eats out at [employer], [employer], [employer], etc o She drinks 4-5 bottles of water daily o Does not drink sodas . Exercise extensively o Pt was going to the gym 4-5 times weekly before COVID o Now pt is walking down  the street to the park up to 3 times weekly (for 15-20 minutes each time) o Recommend pt get 30 minutes of moderate intensity exercise daily 5 times a week (150 minutes total per week) . What HgbA1c is and that her goal she be to get back down to normal level (<5.7%) . The importance of diet and exercise to reduce A1c  Plan Continue control with diet and exercise   Hyperlipidemia   LDL goal < 100  Lipid Panel     Component Value Date/Time   CHOL 169 01/18/2020 0959   TRIG 84 01/18/2020 0959   HDL 54 01/18/2020 0959   LDLCALC 99 01/18/2020 0959    Hepatic Function Latest Ref Rng & Units 01/18/2020 07/14/2019 01/12/2019  Total Protein 6.0 - 8.5 g/dL 7.2 7.1 7.5  Albumin 3.7 - 4.7 g/dL 4.2 4.0 4.3  AST 0 - 40 IU/L _0 ALT 0 - 32 IU/L _1 Alk Phosphatase 48 - 121 IU/L 63 66 65  Total Bilirubin 0.0 - 1.2 mg/dL 0.4 0.4 0.5     The 10-year ASCVD risk score Mikey Bussing DC Jr., et al., 2013) is: 13.7%   Values used to calculate the score:     Age: 30 years     Sex: Female     Is Non-Hispanic African American: Yes     Diabetic: No     Tobacco smoker: No     Systolic Blood Pressure: 510 mmHg     Is BP treated: No     HDL Cholesterol: 54 mg/dL     Total Cholesterol: 169 mg/dL   Patient has failed these meds in past: Welchol, Zetia,  Patient is currently controlled on the following medications:  . Livalo 49m Monday, Wednesday, and Friday  We discussed:   . Pt signed application for refills for Livalo patient assistance program o She also provided proof of income . She has already been approved and received 1 shipment from the patient assistance program  Plan Continue current medications   Hypertension   BP goal is:  <130/80  Office blood pressures are  BP Readings from Last 3 Encounters:  01/18/20 (!) 130/78  10/08/19 (!) 156/92  07/14/19 (!) 142/80   Patient checks BP at home infrequently (if she feels bad) Patient home BP readings are ranging: None to provide  today  Patient has failed these meds in the past: N/A Patient is currently controlled on the following medications:  . Lisinopril 143mdaily . Hydrochlorothiazide 12.59m29maily  We discussed: . GMarland Kitchenal BP <130/80 . Pt says that when she checks BP at home it is always good  . Denies headaches or dizziness . Recommend pt check BP once a week and record  Plan Continue current medications   Osteopenia / Osteoporosis   Last DEXA Scan: 04/07/19  T-Score femoral neck: -1.9  T-Score total hip:   T-Score lumbar spine:   T-Score forearm radius:   10-year probability of major osteoporotic fracture:   10-year probability of hip fracture:   Vit D, 25-Hydroxy  Date Value Ref Range Status  01/18/2020 64.1 30.0 - 100.0 ng/mL Final    Comment:    Vitamin D deficiency has been defined by  the [place] practice guideline as a level of serum 25-OH vitamin D less than 20 ng/mL (1,2). The Endocrine Society went on to further define vitamin D insufficiency as a level between 21 and 29 ng/mL (2). 1. IOM (Institute of Medicine). 2010. Dietary reference    intakes for calcium and D. [place]: The    [employer]. 2. Holick MF, Binkley [place], Bischoff-Ferrari HA, et al.    Evaluation, treatment, and prevention of vitamin D    deficiency: an Endocrine Society clinical practice    guideline. JCEM. 2011 Jul; 96(7):1911-30.     Patient is not a candidate for pharmacologic treatment  Patient has failed these meds in past: N/A Patient is currently controlled on the following medications:  . Cholecalciferol 5000 units daily . Calcium carbonate/Vitamin D 128m/400 IU every other day  We discussed:  Recommend weight-bearing and muscle strengthening exercises for building and maintaining bone density.   Pt has hand weights at home she can use  Pt's calcium was elevated at last office visit (11.3) so she started taking her 12026mcalcium supplement  twice weekly (Monday and Friday) instead of daily  Discussed that 120039ms the recommended amount of calcium per day including supplements and diet  Advised pt that I will send her dietary sources of calcium to help her determine how much calcium she is getting through her diet  Depending on how much calcium she is getting through her diet she may be able to take a lower calcium dose (600m41mily) or none at all  Importance of calcium, Vitamin D, and weight- bearing exercises to strengthen bones due to long term anastrozole  Plan Continue current medications and control with diet and exercise  Health Maintenance   Patient is currently on the following medications:  . MuMarland Kitchentivitamin daily . Anastrozole 1mg 40mly  We discussed:  Pt taking anastrozole for history of breast cancer/breast cancer prevention  Therapy has been extended from 5 years to 7 years  Plan Continue current medications   Vaccines   Reviewed and discussed patient's vaccination history.    Immunization History  Administered Date(s) Administered  . Influenza, High Dose Seasonal PF 04/23/2018, 03/22/2019  . PFIZER SARS-COV-2 Vaccination 07/30/2019, 08/20/2019  . Zoster Recombinat (Shingrix) 02/15/2019, 04/16/2019   Plan Review and discuss at follow up  Medication Management   [facility] Uses pill box? No - But pt says she will start using Pt endorses 100% compliance  We discussed:  . Importance of taking each medications daily as directed . Medications are affordable except for Livalo . Takes all medications in the morning when she eats (by 8am)  Plan Continue current medication management strategy   Follow up: 3 month phone visit  CourtJannette FogormD Clinical Pharmacist [facility] [phone]

## 2020-06-02 ENCOUNTER — Telehealth: Payer: Self-pay

## 2020-06-02 NOTE — Chronic Care Management (AMB) (Signed)
Chronic Care Management Pharmacy Assistant   Name: Dayan Kreis  MRN: [id number] DOB: 16-Sep-1945  Reason for Encounter: Disease State Hypertension Adherence Call.   Have you seen any other providers since your last visit? No  Any changes in your medications or health? No      PCP : Glendale Chard, MD  Allergies:  No Known Allergies  Medications: Outpatient Encounter Medications as of 06/02/2020  Medication Sig   anastrozole (ARIMIDEX) 1 MG tablet Take 1 tablet (1 mg total) by mouth daily.   Calcium Carbonate-Vitamin D (CALCIUM 600+D) 600-400 MG-UNIT tablet Take 1 tablet by mouth every other day. 1200mg    Cholecalciferol (VITAMIN D3) 125 MCG (5000 UT) CAPS Take by mouth.   hydrochlorothiazide (MICROZIDE) 12.5 MG capsule Take 1 capsule by mouth once daily   lisinopril (ZESTRIL) 10 MG tablet Take 1 tablet by mouth once daily   Multiple Vitamins-Minerals (MULTIVITAMIN WITH MINERALS) tablet Take 1 tablet by mouth daily.   Pitavastatin Calcium (LIVALO) 2 MG TABS Take 1 tablet (2 mg total) by mouth 3 (three) times a week. Takes on Monday Wednesday and Friday   No facility-administered encounter medications on file as of 06/02/2020.    Current Diagnosis: Patient Active Problem List   Diagnosis Date Noted   Bilateral carpal tunnel syndrome 02/23/2020   Pure hypercholesterolemia 01/18/2020   Vitamin D deficiency 01/18/2020   Class 3 severe obesity due to excess calories with serious comorbidity and body mass index (BMI) of 40.0 to 44.9 in adult [facility]) 07/14/2019   Chronic renal disease, stage II 04/23/2018   Other abnormal glucose 04/23/2018   Benign hypertension with CKD (chronic kidney disease), stage II 11/27/2017   Hip pain 12/30/2014   Breast cancer of lower-outer quadrant of right female breast ([place]) 09/06/2014   Reviewed chart prior to disease state call. Spoke with patient regarding BP  Recent Office Vitals: BP Readings from Last 3 Encounters:   01/18/20 (!) 130/78  10/08/19 (!) 156/92  07/14/19 (!) 142/80   Pulse Readings from Last 3 Encounters:  01/18/20 78  10/08/19 87  07/14/19 96    Wt Readings from Last 3 Encounters:  01/18/20 202 lb 6.4 oz (91.8 kg)  10/08/19 210 lb 9.6 oz (95.5 kg)  07/14/19 207 lb 12.8 oz (94.3 kg)     Kidney Function Lab Results  Component Value Date/Time   CREATININE 0.80 01/18/2020 09:59 AM   CREATININE 0.86 07/14/2019 09:37 AM   CREATININE 0.8 12/22/2014 01:44 PM   CREATININE 0.9 09/07/2014 12:37 PM   GFRNONAA 73 01/18/2020 09:59 AM   GFRAA 84 01/18/2020 09:59 AM    BMP Latest Ref Rng & Units 01/18/2020 07/14/2019 01/12/2019  Glucose 65 - 99 mg/dL 109(H) 111(H) 109(H)  BUN 8 - 27 mg/dL 14 12 12   Creatinine 0.57 - 1.00 mg/dL 0.80 0.86 0.87  BUN/Creat Ratio 12 - 28 18 14 14   Sodium 134 - 144 mmol/L 138 140 137  Potassium 3.5 - 5.2 mmol/L 4.1 4.2 3.8  Chloride 96 - 106 mmol/L 101 103 98  CO2 20 - 29 mmol/L 25 25 23   Calcium 8.7 - 10.3 mg/dL 11.3(H) 10.9(H) 10.6(H)     Current antihypertensive regimen:  o Lisinopril 10 mg one tablet a day o Hydrochlorothiazide 12.5 one tablet a day o   How often are you checking your Blood Pressure? No   Current home BP readings: patient does not check blood pressure unless she feels bad.   What recent interventions/DTPs have been made by any  provider to improve Blood Pressure control since last CPP Visit:  Patient states she takes medication as directed by provider.   Any recent hospitalizations or ED visits since last visit with CPP? No   What diet changes have been made to improve Blood Pressure Control?  o Patient states she has been watching her salt .   What exercise is being done to improve your Blood Pressure Control?  o Patient states she has been active.   Adherence Review: Is the patient currently on ACE/ARB medication? yes  Does the patient have >5 day gap between last estimated fill dates? No    Follow-Up:   Pharmacist Review   Vallie Pearson,CPP Notified  Judithann Sheen, [facility] Clinical Pharmacist Assistant [phone]

## 2020-06-20 ENCOUNTER — Telehealth: Payer: Self-pay

## 2020-06-20 NOTE — Chronic Care Management (AMB) (Signed)
    Chronic Care Management Pharmacy Assistant   Name: Cynthia Barton  MRN: [id number] DOB: 1945-09-09  Reason for Encounter: Medication Review    PCP : Dorothyann Peng, MD  Allergies:  No Known Allergies  Medications: Outpatient Encounter Medications as of 06/20/2020  Medication Sig  . anastrozole (ARIMIDEX) 1 MG tablet Take 1 tablet (1 mg total) by mouth daily.  . Calcium Carbonate-Vitamin D (CALCIUM 600+D) 600-400 MG-UNIT tablet Take 1 tablet by mouth every other day. 1200mg   . Cholecalciferol (VITAMIN D3) 125 MCG (5000 UT) CAPS Take by mouth.  . hydrochlorothiazide (MICROZIDE) 12.5 MG capsule Take 1 capsule by mouth once daily  . lisinopril (ZESTRIL) 10 MG tablet Take 1 tablet by mouth once daily  . Multiple Vitamins-Minerals (MULTIVITAMIN WITH MINERALS) tablet Take 1 tablet by mouth daily.  . Pitavastatin Calcium (LIVALO) 2 MG TABS Take 1 tablet (2 mg total) by mouth 3 (three) times a week. Takes on Monday Wednesday and Friday   No facility-administered encounter medications on file as of 06/20/2020.    Current Diagnosis: Patient Active Problem List   Diagnosis Date Noted  . Bilateral carpal tunnel syndrome 02/23/2020  . Pure hypercholesterolemia 01/18/2020  . Vitamin D deficiency 01/18/2020  . Class 3 severe obesity due to excess calories with serious comorbidity and body mass index (BMI) of 40.0 to 44.9 in adult ([facility]) 07/14/2019  . Chronic renal disease, stage II 04/23/2018  . Other abnormal glucose 04/23/2018  . Benign hypertension with CKD (chronic kidney disease), stage II 11/27/2017  . Hip pain 12/30/2014  . Breast cancer of lower-outer quadrant of right female breast ([facility]) 09/06/2014     Follow-Up:  Pharmacist Review Reviewed chart and adherence measures . Per insurance data medication adherence for hypertension 90-99 % med compliance.  Cynthia Barton,CPP Notified  12-19-1989, [facility] Clinical Pharmacist Assistant [phone]

## 2020-06-22 ENCOUNTER — Other Ambulatory Visit: Payer: Medicare PPO

## 2020-06-22 ENCOUNTER — Other Ambulatory Visit: Payer: Self-pay

## 2020-06-22 DIAGNOSIS — Z20822 Contact with and (suspected) exposure to covid-19: Secondary | ICD-10-CM | POA: Diagnosis not present

## 2020-06-24 LAB — SARS-COV-2, NAA 2 DAY TAT

## 2020-06-24 LAB — NOVEL CORONAVIRUS, NAA: SARS-CoV-2, NAA: NOT DETECTED

## 2020-06-25 DIAGNOSIS — U071 COVID-19: Secondary | ICD-10-CM | POA: Diagnosis not present

## 2020-06-30 ENCOUNTER — Other Ambulatory Visit: Payer: Self-pay

## 2020-06-30 DIAGNOSIS — Z20822 Contact with and (suspected) exposure to covid-19: Secondary | ICD-10-CM

## 2020-07-04 ENCOUNTER — Telehealth: Payer: Self-pay

## 2020-07-04 LAB — NOVEL CORONAVIRUS, NAA: SARS-CoV-2, NAA: DETECTED — AB

## 2020-07-04 NOTE — Telephone Encounter (Signed)
The pt was notified that she her covid results are positive.  The pt was told to quarntine for 10 days, to take zyrtec or allegra vitd c, vita d, zinc, elderberry, tylenol for fever, and body aches and to call the office if she has any symptoms.  The pt said she is only having a little  cold symptoms at this time and feels ok.Marland Kitchen

## 2020-07-20 ENCOUNTER — Ambulatory Visit (INDEPENDENT_AMBULATORY_CARE_PROVIDER_SITE_OTHER): Payer: Medicare PPO

## 2020-07-20 ENCOUNTER — Encounter: Payer: Self-pay | Admitting: Internal Medicine

## 2020-07-20 ENCOUNTER — Other Ambulatory Visit: Payer: Self-pay

## 2020-07-20 ENCOUNTER — Ambulatory Visit: Payer: Medicare PPO | Admitting: Internal Medicine

## 2020-07-20 VITALS — BP 140/82 | HR 80 | Temp 97.9°F | Ht 59.0 in | Wt 199.7 lb

## 2020-07-20 VITALS — BP 140/82 | HR 80 | Temp 97.9°F | Ht 59.0 in | Wt 199.8 lb

## 2020-07-20 DIAGNOSIS — Z23 Encounter for immunization: Secondary | ICD-10-CM | POA: Diagnosis not present

## 2020-07-20 DIAGNOSIS — Z6841 Body Mass Index (BMI) 40.0 and over, adult: Secondary | ICD-10-CM | POA: Diagnosis not present

## 2020-07-20 DIAGNOSIS — Z8616 Personal history of COVID-19: Secondary | ICD-10-CM | POA: Diagnosis not present

## 2020-07-20 DIAGNOSIS — N182 Chronic kidney disease, stage 2 (mild): Secondary | ICD-10-CM

## 2020-07-20 DIAGNOSIS — I129 Hypertensive chronic kidney disease with stage 1 through stage 4 chronic kidney disease, or unspecified chronic kidney disease: Secondary | ICD-10-CM

## 2020-07-20 DIAGNOSIS — R7309 Other abnormal glucose: Secondary | ICD-10-CM

## 2020-07-20 DIAGNOSIS — Z0000 Encounter for general adult medical examination without abnormal findings: Secondary | ICD-10-CM

## 2020-07-20 LAB — POCT URINALYSIS DIPSTICK
Bilirubin, UA: NEGATIVE
Blood, UA: NEGATIVE
Glucose, UA: NEGATIVE
Ketones, UA: NEGATIVE
Nitrite, UA: NEGATIVE
Protein, UA: NEGATIVE
Spec Grav, UA: 1.015 (ref 1.010–1.025)
Urobilinogen, UA: 0.2 E.U./dL
pH, UA: 5.5 (ref 5.0–8.0)

## 2020-07-20 LAB — POCT UA - MICROALBUMIN
Albumin/Creatinine Ratio, Urine, POC: <30
Creatinine, POC: 50 mg/dL
Microalbumin Ur, POC: 10 mg/L

## 2020-07-20 MED ORDER — PREVNAR 13 IM SUSP: 0.5000 mL | INTRAMUSCULAR | 0 refills | Status: AC

## 2020-07-20 NOTE — Addendum Note (Signed)
Addended by: Kellie Simmering on: 07/20/2020 03:27 PM   Modules accepted: Orders

## 2020-07-20 NOTE — Patient Instructions (Signed)

## 2020-07-20 NOTE — Patient Instructions (Signed)
Cynthia Barton , Thank you for taking time to come for your Medicare Wellness Visit. I appreciate your ongoing commitment to your health goals. Please review the following plan we discussed and let me know if I can assist you in the future.   Screening recommendations/referrals: Colonoscopy: completed 07/27/2012 Mammogram: completed 10/25/2019 Bone Density: completed 04/07/2019 Recommended yearly ophthalmology/optometry visit for glaucoma screening and checkup Recommended yearly dental visit for hygiene and checkup  Vaccinations: Influenza vaccine: today Pneumococcal vaccine: sent to pharmacy Tdap vaccine: completed 01/31/2014 Shingles vaccine: discussed   Covid-19: 05/31/2020, 08/20/2019, 07/30/2019  Advanced directives: Please bring a copy of your POA (Power of Attorney) and/or Living Will to your next appointment.   Conditions/risks identified: none  Next appointment: Follow up in one year for your annual wellness visit    Preventive Care 65 Years and Older, Female Preventive care refers to lifestyle choices and visits with your health care provider that can promote health and wellness. What does preventive care include?  A yearly physical exam. This is also called an annual well check.  Dental exams once or twice a year.  Routine eye exams. Ask your health care provider how often you should have your eyes checked.  Personal lifestyle choices, including:  Daily care of your teeth and gums.  Regular physical activity.  Eating a healthy diet.  Avoiding tobacco and drug use.  Limiting alcohol use.  Practicing safe sex.  Taking low-dose aspirin every day.  Taking vitamin and mineral supplements as recommended by your health care provider. What happens during an annual well check? The services and screenings done by your health care provider during your annual well check will depend on your age, overall health, lifestyle risk factors, and family history of disease. Counseling   Your health care provider may ask you questions about your:  Alcohol use.  Tobacco use.  Drug use.  Emotional well-being.  Home and relationship well-being.  Sexual activity.  Eating habits.  History of falls.  Memory and ability to understand (cognition).  Work and work Statistician.  Reproductive health. Screening  You may have the following tests or measurements:  Height, weight, and BMI.  Blood pressure.  Lipid and cholesterol levels. These may be checked every 5 years, or more frequently if you are over 4 years old.  Skin check.  Lung cancer screening. You may have this screening every year starting at age 40 if you have a 30-pack-year history of smoking and currently smoke or have quit within the past 15 years.  Fecal occult blood test (FOBT) of the stool. You may have this test every year starting at age 61.  Flexible sigmoidoscopy or colonoscopy. You may have a sigmoidoscopy every 5 years or a colonoscopy every 10 years starting at age 20.  Hepatitis C blood test.  Hepatitis B blood test.  Sexually transmitted disease (STD) testing.  Diabetes screening. This is done by checking your blood sugar (glucose) after you have not eaten for a while (fasting). You may have this done every 1-3 years.  Bone density scan. This is done to screen for osteoporosis. You may have this done starting at age 21.  Mammogram. This may be done every 1-2 years. Talk to your health care provider about how often you should have regular mammograms. Talk with your health care provider about your test results, treatment options, and if necessary, the need for more tests. Vaccines  Your health care provider may recommend certain vaccines, such as:  Influenza vaccine. This is recommended  every year.  Tetanus, diphtheria, and acellular pertussis (Tdap, Td) vaccine. You may need a Td booster every 10 years.  Zoster vaccine. You may need this after age 33.  Pneumococcal 13-valent  conjugate (PCV13) vaccine. One dose is recommended after age 73.  Pneumococcal polysaccharide (PPSV23) vaccine. One dose is recommended after age 70. Talk to your health care provider about which screenings and vaccines you need and how often you need them. This information is not intended to replace advice given to you by your health care provider. Make sure you discuss any questions you have with your health care provider. Document Released: 07/07/2015 Document Revised: 02/28/2016 Document Reviewed: 04/11/2015 Elsevier Interactive Patient Education  2017 [place] Prevention in the Home Falls can cause injuries. They can happen to people of all ages. There are many things you can do to make your home safe and to help prevent falls. What can I do on the outside of my home?  Regularly fix the edges of walkways and driveways and fix any cracks.  Remove anything that might make you trip as you walk through a door, such as a raised step or threshold.  Trim any bushes or trees on the path to your home.  Use bright outdoor lighting.  Clear any walking paths of anything that might make someone trip, such as rocks or tools.  Regularly check to see if handrails are loose or broken. Make sure that both sides of any steps have handrails.  Any raised decks and porches should have guardrails on the edges.  Have any leaves, snow, or ice cleared regularly.  Use sand or salt on walking paths during winter.  Clean up any spills in your garage right away. This includes oil or grease spills. What can I do in the bathroom?  Use night lights.  Install grab bars by the toilet and in the tub and shower. Do not use towel bars as grab bars.  Use non-skid mats or decals in the tub or shower.  If you need to sit down in the shower, use a plastic, non-slip stool.  Keep the floor dry. Clean up any water that spills on the floor as soon as it happens.  Remove soap buildup in the tub or  shower regularly.  Attach bath mats securely with double-sided non-slip rug tape.  Do not have throw rugs and other things on the floor that can make you trip. What can I do in the bedroom?  Use night lights.  Make sure that you have a light by your bed that is easy to reach.  Do not use any sheets or blankets that are too big for your bed. They should not hang down onto the floor.  Have a firm chair that has side arms. You can use this for support while you get dressed.  Do not have throw rugs and other things on the floor that can make you trip. What can I do in the kitchen?  Clean up any spills right away.  Avoid walking on wet floors.  Keep items that you use a lot in easy-to-reach places.  If you need to reach something above you, use a strong step stool that has a grab bar.  Keep electrical cords out of the way.  Do not use floor polish or wax that makes floors slippery. If you must use wax, use non-skid floor wax.  Do not have throw rugs and other things on the floor that can make you trip. What  can I do with my stairs?  Do not leave any items on the stairs.  Make sure that there are handrails on both sides of the stairs and use them. Fix handrails that are broken or loose. Make sure that handrails are as long as the stairways.  Check any carpeting to make sure that it is firmly attached to the stairs. Fix any carpet that is loose or worn.  Avoid having throw rugs at the top or bottom of the stairs. If you do have throw rugs, attach them to the floor with carpet tape.  Make sure that you have a light switch at the top of the stairs and the bottom of the stairs. If you do not have them, ask someone to add them for you. What else can I do to help prevent falls?  Wear shoes that:  Do not have high heels.  Have rubber bottoms.  Are comfortable and fit you well.  Are closed at the toe. Do not wear sandals.  If you use a stepladder:  Make sure that it is fully  opened. Do not climb a closed stepladder.  Make sure that both sides of the stepladder are locked into place.  Ask someone to hold it for you, if possible.  Clearly mark and make sure that you can see:  Any grab bars or handrails.  First and last steps.  Where the edge of each step is.  Use tools that help you move around (mobility aids) if they are needed. These include:  Canes.  Walkers.  Scooters.  Crutches.  Turn on the lights when you go into a dark area. Replace any light bulbs as soon as they burn out.  Set up your furniture so you have a clear path. Avoid moving your furniture around.  If any of your floors are uneven, fix them.  If there are any pets around you, be aware of where they are.  Review your medicines with your doctor. Some medicines can make you feel dizzy. This can increase your chance of falling. Ask your doctor what other things that you can do to help prevent falls. This information is not intended to replace advice given to you by your health care provider. Make sure you discuss any questions you have with your health care provider. Document Released: 04/06/2009 Document Revised: 11/16/2015 Document Reviewed: 07/15/2014 Elsevier Interactive Patient Education  2017 [employer].

## 2020-07-20 NOTE — Progress Notes (Signed)
I,Yamilka [employer] as a Education administrator for [name], MD.,have documented all relevant documentation on the behalf of [name], MD,as directed by  [name], MD while in the presence of [name], MD. This visit occurred during the SARS-CoV-2 public health emergency.  Safety protocols were in place, including screening questions prior to the visit, additional usage of staff PPE, and extensive cleaning of exam room while observing appropriate contact time as indicated for disinfecting solutions.  Subjective:     Patient ID: Cynthia Barton , female    DOB: 10/30/1945 , 75 y.o.   MRN: [id number]   Chief Complaint  Patient presents with  . Hypertension    HPI  Patient presents today for a blood pressure f/u. She reports compliance with meds. She denies headaches, chest pain and shortness of breath. She recently had COVID. She reports she didn't feel too bad. She was exposed by her granddaughter who had a recent trip to [place]. Her sister was visiting from [place] and she caught COVID as well. Neither required hospitalization.   Hypertension This is a chronic problem. The current episode started more than 1 year ago. The problem has been gradually improving since onset. The problem is controlled. Pertinent negatives include no blurred vision or palpitations. Risk factors for coronary artery disease include dyslipidemia, post-menopausal state, obesity and sedentary lifestyle. The current treatment provides moderate improvement. Compliance problems include exercise.  Hypertensive end-organ damage includes kidney disease.     Past Medical History:  Diagnosis Date  . Bilateral carpal tunnel syndrome 02/23/2020  . Breast cancer ([place]) 09/07/2014   ER+/PR+/Her2- right breast  . Breast cancer of lower-outer quadrant of right female breast ([place]) 09/06/2014  . Hyperlipidemia   . Hypertension   . Radiation 11/10/14-12/09/14   Right Breast  . Uterine prolapse 2016  . Wears  dentures    top  . Wears glasses      Family History  Problem Relation Age of Onset  . Heart disease Father   . Melanoma Mother   . Kidney failure Sister 15  . Heart disease Maternal Uncle      Current Outpatient Medications:  .  anastrozole (ARIMIDEX) 1 MG tablet, Take 1 tablet (1 mg total) by mouth daily., Disp: 90 tablet, Rfl: 3 .  Calcium Carbonate-Vitamin D 600-400 MG-UNIT tablet, Take 1 tablet by mouth every other day. 1250m, Disp: , Rfl:  .  Cholecalciferol (VITAMIN D3) 125 MCG (5000 UT) CAPS, Take by mouth., Disp: , Rfl:  .  hydrochlorothiazide (MICROZIDE) 12.5 MG capsule, Take 1 capsule by mouth once daily, Disp: 90 capsule, Rfl: 0 .  lisinopril (ZESTRIL) 10 MG tablet, Take 1 tablet by mouth once daily, Disp: 90 tablet, Rfl: 0 .  Multiple Vitamins-Minerals (MULTIVITAMIN WITH MINERALS) tablet, Take 1 tablet by mouth daily., Disp: , Rfl:  .  Pitavastatin Calcium (LIVALO) 2 MG TABS, Take 1 tablet (2 mg total) by mouth 3 (three) times a week. Takes on Monday Wednesday and Friday, Disp: 36 tablet, Rfl: 3   No Known Allergies   Review of Systems  Constitutional: Negative.   HENT: Negative.   Eyes: Negative for blurred vision.  Respiratory: Negative.   Cardiovascular: Negative.  Negative for palpitations.  Gastrointestinal: Negative.   Musculoskeletal: Negative.   Skin: Negative.   Neurological: Negative.   Hematological: Negative.   Psychiatric/Behavioral: Negative.      Today's Vitals   07/20/20 0904  BP: 140/82  Pulse: 80  Temp: 97.9 F (36.6 C)  TempSrc: Oral  Weight: 199 lb 11.8 oz (90.6 kg)  Height: _0  (1.499 m)   Body mass index is 40.34 kg/m.  Wt Readings from Last 3 Encounters:  07/20/20 199 lb 11.8 oz (90.6 kg)  07/20/20 199 lb 12.8 oz (90.6 kg)  01/18/20 202 lb 6.4 oz (91.8 kg)    Objective:  Physical Exam Vitals and nursing note reviewed.  Constitutional:      General: She is not in acute distress.    Appearance: Normal appearance.   HENT:     Head: Normocephalic and atraumatic.  Cardiovascular:     Rate and Rhythm: Normal rate and regular rhythm.     Heart sounds: Normal heart sounds.  Pulmonary:     Effort: Pulmonary effort is normal.     Breath sounds: Normal breath sounds.  Abdominal:     General: Bowel sounds are normal.     Palpations: Abdomen is soft.  Musculoskeletal:        General: Normal range of motion.     Cervical back: Normal range of motion and neck supple.  Skin:    General: Skin is warm and dry.     Capillary Refill: Capillary refill takes less than 2 seconds.  Neurological:     General: No focal deficit present.     Mental Status: She is alert and oriented to person, place, and time.  Psychiatric:        Mood and Affect: Mood normal.        Behavior: Behavior normal.        Thought Content: Thought content normal.        Judgment: Judgment normal.         Assessment And Plan:     1. Benign hypertension with CKD (chronic kidney disease), stage II Comments: Chronic, fair control. She will continue with current meds. She has yet to take meds today. She will c/w current meds including for now.  - CMP14+EGFR - CBC no Diff  2. Chronic renal disease, stage II Comments: Chronic, this has been stable. I will check GFR. Cr today. Encouraged to stay well hydrated and to maintain optimal BP less than 130/80.  3. Other abnormal glucose Comments: Her a1c has been elevated in the past. I will recheck hba1c today. She is encouraged to avoid sugary beverages, including diet drinks.  - Hemoglobin A1c  4. Class 3 severe obesity due to excess calories with serious comorbidity and body mass index (BMI) of 40.0 to 44.9 in adult [facility]) Comments: She is encouraged to initially strive for BMI less than 35 to decrease cardiac risk. Advised to aim for at least 150 minutes of exercise per week. She is remind  5. Personal history of COVID-19 Comments: Pt advised repeat testing not necessary. She was adamant  to have repeat testing despite my explanation.  - Novel Coronavirus, NAA ([facility])  6. Immunization due Comments: She was given high dose flu vaccine prior to my assessment. Pt advised I would have waited another 7-10 days given her recent COVID infection.  - Flu Vaccine QUAD High Dose(Fluad)  Patient was given opportunity to ask questions. Patient verbalized understanding of the plan and was able to repeat key elements of the plan. All questions were answered to their satisfaction.  [name], MD   I, [name], MD, have reviewed all documentation for this visit. The documentation on 07/22/20 for the exam, diagnosis, procedures, and orders are all accurate and complete.  THE PATIENT IS ENCOURAGED  TO PRACTICE SOCIAL DISTANCING DUE TO THE COVID-19 PANDEMIC.

## 2020-07-20 NOTE — Progress Notes (Signed)
This visit occurred during the SARS-CoV-2 public health emergency.  Safety protocols were in place, including screening questions prior to the visit, additional usage of staff PPE, and extensive cleaning of exam room while observing appropriate contact time as indicated for disinfecting solutions.  Subjective:   Cynthia Barton is a 75 y.o. female who presents for Medicare Annual (Subsequent) preventive examination.  Review of Systems     Cardiac Risk Factors include: advanced age (>72mn, >>47women);hypertension;obesity (BMI >30kg/m2);sedentary lifestyle     Objective:    Today's Vitals   07/20/20 0850  BP: 140/82  Pulse: 80  Temp: 97.9 F (36.6 C)  TempSrc: Oral  SpO2: 98%  Weight: 199 lb 12.8 oz (90.6 kg)  Height: '4\' 11"'  (1.499 m)   Body mass index is 40.35 kg/m.  Advanced Directives 07/20/2020 07/14/2019 07/09/2018 04/02/2017 10/22/2016 04/24/2016 10/24/2015  Does Patient Have a Medical Advance Directive? Yes No No No No No No  Type of AParamedicof ALyleLiving will - - - - - -  [place] of [place]? No - copy requested - - - - - -  Would patient like information on creating a medical advance directive? - Yes (MAU/Ambulatory/Procedural Areas - Information given) No - Patient declined - - - -    Current Medications (verified) Outpatient Encounter Medications as of 07/20/2020  Medication Sig  . anastrozole (ARIMIDEX) 1 MG tablet Take 1 tablet (1 mg total) by mouth daily.  . Calcium Carbonate-Vitamin D 600-400 MG-UNIT tablet Take 1 tablet by mouth every other day. 12029m . Cholecalciferol (VITAMIN D3) 125 MCG (5000 UT) CAPS Take by mouth.  . hydrochlorothiazide (MICROZIDE) 12.5 MG capsule Take 1 capsule by mouth once daily  . lisinopril (ZESTRIL) 10 MG tablet Take 1 tablet by mouth once daily  . Multiple Vitamins-Minerals (MULTIVITAMIN WITH MINERALS) tablet Take 1 tablet by mouth daily.  . Pitavastatin Calcium (LIVALO) 2 MG TABS  Take 1 tablet (2 mg total) by mouth 3 (three) times a week. Takes on Monday Wednesday and Friday  . pneumococcal 13-valent conjugate vaccine (PREVNAR 13) SUSP injection Inject 0.5 mLs into the muscle tomorrow at 10 am for 1 dose.  . [DISCONTINUED] vitamin C (ASCORBIC ACID) 500 MG tablet Take 500 mg by mouth daily.   No facility-administered encounter medications on file as of 07/20/2020.    Allergies (verified) Patient has no known allergies.   History: Past Medical History:  Diagnosis Date  . Bilateral carpal tunnel syndrome 02/23/2020  . [place]   ER+/PR+/Her2- right breast  . [place] of lower-outer quadrant [place]  . Hyperlipidemia   . Hypertension   . Radiation 11/10/14-12/09/14   Right Breast  . Uterine prolapse 2016  . Wears dentures    top  . Wears glasses    Past Surgical History:  Procedure Laterality Date  . BREAST SURGERY  1980   rt br bx-no anesth  . COLONOSCOPY  07/27/12   mild diverticulosis  . lumpectomy right breast Right 09/30/14  . TUBAL LIGATION     Family History  Problem Relation Age of Onset  . Heart disease Father   . Melanoma Mother   . Kidney failure Sister [id number]. Heart disease Maternal Uncle    Social History   Socioeconomic History  . Marital status: Divorced    Spouse name: Not on file  . Number of children: Not on file  . Years of education: Not on  file  . Highest education level: Not on file  Occupational History  . Occupation: retired  Tobacco Use  . Smoking status: Never Smoker  . Smokeless tobacco: Never Used  Vaping Use  . Vaping Use: Never used  Substance and Sexual Activity  . Alcohol use: Not Currently    Alcohol/week: 0.0 standard drinks  . Drug use: No  . Sexual activity: Not Currently    Comment: 1st intercourse 75 yo-Fewer than 5 partners  Other Topics Concern  . Not on file  Social History Narrative  . Not on file   Social Determinants of Health   Financial  Resource Strain: Low Risk   . Difficulty of Paying Living Expenses: Not hard at all  Food Insecurity: No Food Insecurity  . Worried About Charity fundraiser in the Last Year: Never true  . Ran Out of Food in the Last Year: Never true  Transportation Needs: No Transportation Needs  . Lack of Transportation (Medical): No  . Lack of Transportation (Non-Medical): No  Physical Activity: Inactive  . Days of Exercise per Week: 0 days  . Minutes of Exercise per Session: 0 min  Stress: No Stress Concern Present  . Feeling of Stress : Not at all  Social Connections: Not on file    Tobacco Counseling Counseling given: Not Answered   Clinical Intake:  Pre-visit preparation completed: Yes  Pain : No/denies pain     Nutritional Status: BMI > 30  Obese Nutritional Risks: None Diabetes: No  How often do you need to have someone help you when you read instructions, pamphlets, or other written materials from your doctor or pharmacy?: 1 - Never What is the last grade level you completed in school?: [place]  Diabetic? no  Interpreter Needed?: No  Information entered by :: NAllen LPN   Activities of Daily Living In your present state of health, do you have any difficulty performing the following activities: 07/20/2020  Hearing? N  Vision? N  Difficulty concentrating or making decisions? N  Walking or climbing stairs? N  Dressing or bathing? N  Doing errands, shopping? N  Preparing Food and eating ? N  Using the Toilet? N  In the past six months, have you accidently leaked urine? Y  Comment wears a depends  Do you have problems with loss of bowel control? N  Managing your Medications? N  Managing your Finances? N  Housekeeping or managing your Housekeeping? N  Some recent data might be hidden    Patient Care Team: Glendale Chard, MD as PCP - General (Internal Medicine) Jovita Kussmaul, MD as Consulting Physician (General Surgery) Nicholas Lose, MD as Consulting Physician  (Hematology and Oncology) Thea Silversmith, MD as Consulting Physician (Radiation Oncology) Rockwell Germany, RN as Registered Nurse Mauro Kaufmann, RN as Registered Nurse Holley Bouche, NP (Inactive) as Nurse Practitioner (Nurse Practitioner) Sylvan Cheese, NP as Nurse Practitioner (Hematology and Oncology) Caudill, Kennieth Francois, [facility] (Inactive) as Pharmacist (Pharmacist)  Indicate any recent Medical Services you may have received from other than [facility] providers in the past year (date may be approximate).     Assessment:   This is a routine wellness examination for Cynthia Barton.  Hearing/Vision screen  Hearing Screening   '125Hz'  '250Hz'  '500Hz'  '1000Hz'  '2000Hz'  '3000Hz'  '4000Hz'  '6000Hz'  '8000Hz'   Right ear:           Left ear:           Vision Screening Comments: No regular eye exams  Dietary issues  and exercise activities discussed: Current Exercise Habits: The patient does not participate in regular exercise at present  Goals    .  Patient Stated (pt-stated)      Wants to go on a cruise.    .  Patient Stated      07/20/2020, start exercising and stay healthy    .  Pharmacy Care Plan      CARE PLAN ENTRY (see longitudinal plan of care for additional care plan information)  Current Barriers:  . Chronic Disease Management support, education, and care coordination needs related to Hypertension, Hyperlipidemia, Osteopenia, and Prediabetes   Hypertension BP Readings from Last 3 Encounters:  01/18/20 (!) 130/78  10/08/19 (!) 156/92  07/14/19 (!) 142/80   . Pharmacist Clinical Goal(s): o Over the next 180 days, patient will work with PharmD and providers to maintain BP goal <130/80 . Current regimen:  . Lisinopril 40m daily . Hydrochlorothiazide 12.574mdaily . Interventions: o Provided dietary and exercise recommendations o Discussed appropriate goals for blood pressure (less than 130/80) . Patient self care activities - Over the next 180 days, patient will: o Check BP once  weekly, document, and provide at future appointments o Ensure daily salt intake < 2300 mg/day o Increase exercise to 30 minutes daily, 5 times per week  Hyperlipidemia Lab Results  Component Value Date/Time   LDLCALC 99 01/18/2020 09:59 AM   . Pharmacist Clinical Goal(s): o Over the next 180 days, patient will work with PharmD and providers to maintain LDL goal < 100 . Current regimen:  o Livalo 31m49monday, Wednesday, and Friday . Interventions: o Patient signed application for refills of Livalo and provided proof of income documents o Discussed cholesterol levels are at goal o Provided dietary and exercise recommendations . Patient self care activities - Over the next 180 days, patient will: o Continue Livalo three times weekly o Notify PharmD if any problems with Livalo patient assistance shipments o Increase exercise to 30 minutes daily, 5 times per week  Prediabetes Lab Results  Component Value Date/Time   HGBA1C 6.4 (H) 01/18/2020 09:59 AM   HGBA1C 6.1 (H) 07/14/2019 09:37 AM   HGBA1C 6.1 11/27/2017 12:00 AM   . Pharmacist Clinical Goal(s): o Over the next 180 days, patient will work with PharmD and providers to maintain A1c goal <6.5% . Current regimen:  o N/A . Interventions: o Provided dietary and exercise recommendations o Discussed appropriate goals for Hemoglobin A1c (<5.7% normal, 5.7-6.4% prediabetes) o Patient education regarding the importance of diet and exercise to lower HgbA1c . Patient self care activities - Over the next 180 days, patient will: o Check blood sugar if symptomatic, document, and provide at future appointments o Contact provider with any episodes of hypoglycemia o Increase exercise to 30 minutes daily, 5 times per week  Osteopenia . Pharmacist Clinical Goal(s) o Over the next 90 days, patient will work with PharmD and providers to build and maintain bone density . Current regimen:  . Cholecalciferol 5000 units daily . Calcium  carbonate/Vitamin D 1200m36m0 IU every other day . Interventions: o Discussed patient's elevated calcium level at last office visit o Determined patient started taking calcium 1200mg77mplement every other day instead of daily o Provided patient education regarding calcium content in foods o Discussed 1200mg 79ml daily calcium intake recommended from diet and supplements o Discussed the importance of weight bearing exercises three times weekly . Patient self care activities - Over the next 90 days, patient will: o Determine how much calcium  she is getting through dietary sources daily o Review calcium content in multivitamin o Begin weight bearing exercises three days per week  Medication management . Pharmacist Clinical Goal(s): o Over the next 180 days, patient will work with PharmD and providers to maintain optimal medication adherence . Current pharmacy: [facility] . Interventions o Comprehensive medication review performed. o Continue current medication management strategy . Patient self care activities - Over the next 180 days, patient will: o Focus on medication adherence by using a pill box o Take medications as prescribed o Report any questions or concerns to PharmD and/or provider(s)  Initial goal documentation     .  Weight (lb) < 200 lb (90.7 kg)      07/14/2019, wants to weigh 190 pounds      Depression Screen PHQ 2/9 Scores 07/20/2020 01/17/2020 07/14/2019 01/12/2019 07/09/2018 04/23/2018 10/26/2014  PHQ - 2 Score 0 0 0 0 0 0 0  PHQ- 9 Score - - 0 - 0 - -    Fall Risk Fall Risk  07/20/2020 07/14/2019 01/12/2019 07/09/2018 04/23/2018  Falls in the past year? 1 0 0 0 No  Comment missed a step - - - -  Number falls in past yr: 0 - - - -  Injury with Fall? 0 - - - -  Risk for fall due to : Medication side effect Medication side effect - Medication side effect -  Follow up Falls evaluation completed;Education provided;Falls prevention discussed - - Falls prevention  discussed;Education provided -    FALL RISK PREVENTION PERTAINING TO THE HOME:  Any stairs in or around the home? Yes  If so, are there any without handrails? No  Home free of loose throw rugs in walkways, pet beds, electrical cords, etc? Yes  Adequate lighting in your home to reduce risk of falls? Yes   ASSISTIVE DEVICES UTILIZED TO PREVENT FALLS:  Life alert? No  Use of a cane, walker or w/c? No  Grab bars in the bathroom? No  Shower chair or bench in shower? No  Elevated toilet seat or a handicapped toilet? Yes   TIMED UP AND GO:  Was the test performed? No .  Gait steady and fast without use of assistive device  Cognitive Function:     6CIT Screen 07/20/2020 07/14/2019 07/09/2018  What Year? 0 points 0 points 0 points  What month? 0 points 0 points 0 points  What time? 0 points 0 points 0 points  Count back from 20 2 points 0 points 0 points  Months in reverse 2 points 0 points 0 points  Repeat phrase 6 points 2 points 2 points  Total Score '10 2 2    ' Immunizations Immunization History  Administered Date(s) Administered  . Influenza, High Dose Seasonal PF 04/23/2018, 03/22/2019  . PFIZER(Purple Top)SARS-COV-2 Vaccination 07/30/2019, 08/20/2019, 05/31/2020  . Zoster Recombinat (Shingrix) 02/15/2019, 04/16/2019    TDAP status: Up to date  Flu Vaccine status: Completed at today's visit  Pneumococcal vaccine status: Due, Education has been provided regarding the importance of this vaccine. Advised may receive this vaccine at local pharmacy or Health Dept. Aware to provide a copy of the vaccination record if obtained from local pharmacy or Health Dept. Verbalized acceptance and understanding.  Covid-19 vaccine status: Completed vaccines  Qualifies for Shingles Vaccine? Yes   Zostavax completed No   Shingrix Completed?: Yes  Screening Tests Health Maintenance  Topic Date Due  . PNA vac Low Risk Adult (1 of 2 - PCV13) Never done  .  INFLUENZA VACCINE  01/23/2020   . COVID-19 Vaccine (4 - Booster for Pfizer series) 11/29/2020  . MAMMOGRAM  10/24/2021  . COLONOSCOPY (Pts 45-60yr Insurance coverage will need to be confirmed)  07/27/2022  . TETANUS/TDAP  02/01/2024  . DEXA SCAN  Completed  . Hepatitis C Screening  Completed    Health Maintenance  Health Maintenance Due  Topic Date Due  . PNA vac Low Risk Adult (1 of 2 - PCV13) Never done  . INFLUENZA VACCINE  01/23/2020    Colorectal cancer screening: Type of screening: Colonoscopy. Completed 07/27/2012. Repeat every 10 years  Mammogram status: Completed 10/25/2019. Repeat every year  Bone Density status: Completed 04/07/2019. Results reflect: Bone density results: OSTEOPENIA. Repeat every 2 years.  Lung Cancer Screening: (Low Dose CT Chest recommended if Age 75-80years, 30 pack-year currently smoking OR have quit w/in 15years.) does not qualify.   Lung Cancer Screening Referral: no   Additional Screening:  Hepatitis C Screening: does qualify; Completed 06/15/2012  Vision Screening: Recommended annual ophthalmology exams for early detection of glaucoma and other disorders of the eye. Is the patient up to date with their annual eye exam?  No  Who is the provider or what is the name of the office in which the patient attends annual eye exams? none If pt is not established with a provider, would they like to be referred to a provider to establish care? No .   Dental Screening: Recommended annual dental exams for proper oral hygiene  Community Resource Referral / Chronic Care Management: CRR required this visit?  No   CCM required this visit?  No      Plan:     I have personally reviewed and noted the following in the patient's chart:   . Medical and social history . Use of alcohol, tobacco or illicit drugs  . Current medications and supplements . Functional ability and status . Nutritional status . Physical activity . Advanced directives . List of other  physicians . Hospitalizations, surgeries, and ER visits in previous 12 months . Vitals . Screenings to include cognitive, depression, and falls . Referrals and appointments  In addition, I have reviewed and discussed with patient certain preventive protocols, quality metrics, and best practice recommendations. A written personalized care plan for preventive services as well as general preventive health recommendations were provided to patient.     NKellie Simmering LPN   [id number]  Nurse Notes:

## 2020-07-21 LAB — CBC
Hematocrit: 43 % (ref 34.0–46.6)
Hemoglobin: 14.1 g/dL (ref 11.1–15.9)
MCH: 28 pg (ref 26.6–33.0)
MCHC: 32.8 g/dL (ref 31.5–35.7)
MCV: 86 fL (ref 79–97)
Platelets: 211 10*3/uL (ref 150–450)
RBC: 5.03 x10E6/uL (ref 3.77–5.28)
RDW: 13 % (ref 11.7–15.4)
WBC: 6.6 10*3/uL (ref 3.4–10.8)

## 2020-07-21 LAB — CMP14+EGFR
ALT: 19 IU/L (ref 0–32)
AST: 30 IU/L (ref 0–40)
Albumin/Globulin Ratio: 1.4 (ref 1.2–2.2)
Albumin: 4.2 g/dL (ref 3.7–4.7)
Alkaline Phosphatase: 71 IU/L (ref 44–121)
BUN/Creatinine Ratio: 13 (ref 12–28)
BUN: 11 mg/dL (ref 8–27)
Bilirubin Total: 0.4 mg/dL (ref 0.0–1.2)
CO2: 24 mmol/L (ref 20–29)
Calcium: 11.2 mg/dL — ABNORMAL HIGH (ref 8.7–10.3)
Chloride: 100 mmol/L (ref 96–106)
Creatinine, Ser: 0.87 mg/dL (ref 0.57–1.00)
GFR calc Af Amer: 76 mL/min/{1.73_m2} (ref >59)
GFR calc non Af Amer: 66 mL/min/{1.73_m2} (ref >59)
Globulin, Total: 2.9 g/dL (ref 1.5–4.5)
Glucose: 104 mg/dL — ABNORMAL HIGH (ref 65–99)
Potassium: 4.1 mmol/L (ref 3.5–5.2)
Sodium: 139 mmol/L (ref 134–144)
Total Protein: 7.1 g/dL (ref 6.0–8.5)

## 2020-07-21 LAB — SARS-COV-2, NAA 2 DAY TAT

## 2020-07-21 LAB — HEMOGLOBIN A1C
Est. average glucose Bld gHb Est-mCnc: 134 mg/dL
Hgb A1c MFr Bld: 6.3 % — ABNORMAL HIGH (ref 4.8–5.6)

## 2020-07-21 LAB — NOVEL CORONAVIRUS, NAA: SARS-CoV-2, NAA: NOT DETECTED

## 2020-07-25 ENCOUNTER — Telehealth: Payer: Self-pay

## 2020-07-25 NOTE — Chronic Care Management (AMB) (Addendum)
Chronic Care Management Pharmacy Assistant   Name: Cynthia Barton  MRN: [id number] DOB: 1945/12/18  Reason for Encounter: Hypertension Adherence Call  Patient Questions:  1.  Have you seen any other providers since your last visit? Yes, 07/20/20-Allen, Marissa Calamity, LPN (AWV). 07/20/20-Sanders, Bailey Mech, MD (OV).     2.  Any changes in your medicines or health? No    PCP : Glendale Chard, MD  Allergies:  No Known Allergies  Medications: Outpatient Encounter Medications as of 07/25/2020  Medication Sig   anastrozole (ARIMIDEX) 1 MG tablet Take 1 tablet (1 mg total) by mouth daily.   Calcium Carbonate-Vitamin D 600-400 MG-UNIT tablet Take 1 tablet by mouth every other day. 1200mg    Cholecalciferol (VITAMIN D3) 125 MCG (5000 UT) CAPS Take by mouth.   hydrochlorothiazide (MICROZIDE) 12.5 MG capsule Take 1 capsule by mouth once daily   lisinopril (ZESTRIL) 10 MG tablet Take 1 tablet by mouth once daily   Multiple Vitamins-Minerals (MULTIVITAMIN WITH MINERALS) tablet Take 1 tablet by mouth daily.   Pitavastatin Calcium (LIVALO) 2 MG TABS Take 1 tablet (2 mg total) by mouth 3 (three) times a week. Takes on Monday Wednesday and Friday   No facility-administered encounter medications on file as of 07/25/2020.    Current Diagnosis: Patient Active Problem List   Diagnosis Date Noted   Bilateral carpal tunnel syndrome 02/23/2020   Pure hypercholesterolemia 01/18/2020   Vitamin D deficiency 01/18/2020   Class 3 severe obesity due to excess calories with serious comorbidity and body mass index (BMI) of 40.0 to 44.9 in adult [facility]) 07/14/2019   Chronic renal disease, stage II 04/23/2018   Other abnormal glucose 04/23/2018   Benign hypertension with CKD (chronic kidney disease), stage II 11/27/2017   Hip pain 12/30/2014   Breast cancer of lower-outer quadrant of right female breast ([place]) 09/06/2014   Reviewed chart prior to disease state call. Spoke with patient regarding BP  Recent Office  Vitals: BP Readings from Last 3 Encounters:  07/20/20 140/82  07/20/20 140/82  01/18/20 (!) 130/78   Pulse Readings from Last 3 Encounters:  07/20/20 80  07/20/20 80  01/18/20 78    Wt Readings from Last 3 Encounters:  07/20/20 199 lb 11.8 oz (90.6 kg)  07/20/20 199 lb 12.8 oz (90.6 kg)  01/18/20 202 lb 6.4 oz (91.8 kg)     Kidney Function Lab Results  Component Value Date/Time   CREATININE 0.87 07/20/2020 11:23 AM   CREATININE 0.80 01/18/2020 09:59 AM   CREATININE 0.8 12/22/2014 01:44 PM   CREATININE 0.9 09/07/2014 12:37 PM   GFRNONAA 66 07/20/2020 11:23 AM   GFRAA 76 07/20/2020 11:23 AM    BMP Latest Ref Rng & Units 07/20/2020 01/18/2020 07/14/2019  Glucose 65 - 99 mg/dL 104(H) 109(H) 111(H)  BUN 8 - 27 mg/dL 11 14 12   Creatinine 0.57 - 1.00 mg/dL 0.87 0.80 0.86  BUN/Creat Ratio 12 - 28 13 18 14   Sodium 134 - 144 mmol/L 139 138 140  Potassium 3.5 - 5.2 mmol/L 4.1 4.1 4.2  Chloride 96 - 106 mmol/L 100 101 103  CO2 20 - 29 mmol/L 24 25 25   Calcium 8.7 - 10.3 mg/dL 11.2(H) 11.3(H) 10.9(H)    Current antihypertensive regimen:  Lisinopril 10mg  daily Hydrochlorothiazide 12.5mg  daily  How often are you checking your Blood Pressure?  Patient states she checks every 2 weeks and when not feeling well.    Current home BP readings: 140/82 from last visit with Dr. Baird Cancer per patient.  What recent interventions/DTPs have been made by any provider to improve Blood Pressure control since last CPP Visit:  Recent interventions was to check blood pressure once weekly, document, and provide at the future appointments, ensure daily salt intake <2300 mg/day, increase exercise to 30 minutes daily, 5 times per week. Patient states she is taking her medications as prescribed.  Any recent hospitalizations or ED visits since last visit with CPP? No   What diet changes have been made to improve Blood Pressure Control?  Patient states she eats cereal, yogurt, and a banana for breakfast. A  bottle of water to drink when taking her medications. May skip lunch and eat dinner around 4 pm. Patient stated sometimes she may have dinner at [employer] and eat the beef stew with a starch and vegetable. Patient also stated she does eat fried chicken wings, maybe fish, french fries sometimes, a salad, and other different veggies.  What exercise is being done to improve your Blood Pressure Control?  Patient reports she is physically active in the house for at least 30 minutes doing house chores. "I don't just sit around" per patient.  Adherence Review: Is the patient currently on ACE/ARB medication? Yes Does the patient have >5 day gap between last estimated fill dates? No    Goals Addressed             This Visit's Progress    Pharmacy Care Plan   Not on track    [place] (see longitudinal plan of care for additional care plan information)  Current Barriers:  Chronic Disease Management support, education, and care coordination needs related to Hypertension, Hyperlipidemia, Osteopenia, and Prediabetes   Hypertension BP Readings from Last 3 Encounters:  01/18/20 (!) 130/78  10/08/19 (!) 156/92  07/14/19 (!) 142/80   Pharmacist Clinical Goal(s): Over the next 180 days, patient will work with PharmD and providers to maintain BP goal <130/80 Current regimen:  Lisinopril 10mg  daily Hydrochlorothiazide 12.5mg  daily Interventions: Provided dietary and exercise recommendations Discussed appropriate goals for blood pressure (less than 130/80) Patient self care activities - Over the next 180 days, patient will: Check BP once weekly, document, and provide at future appointments Ensure daily salt intake < 2300 mg/day Increase exercise to 30 minutes daily, 5 times per week  Hyperlipidemia Lab Results  Component Value Date/Time   LDLCALC 99 01/18/2020 09:59 AM   Pharmacist Clinical Goal(s): Over the next 180 days, patient will work with PharmD and providers to  maintain LDL goal < 100 Current regimen:  Livalo 2mg  Monday, Wednesday, and Friday Interventions: Patient signed application for refills of Livalo and provided proof of income documents Discussed cholesterol levels are at goal Provided dietary and exercise recommendations Patient self care activities - Over the next 180 days, patient will: Continue Livalo three times weekly Notify PharmD if any problems with Livalo patient assistance shipments Increase exercise to 30 minutes daily, 5 times per week  Prediabetes Lab Results  Component Value Date/Time   HGBA1C 6.4 (H) 01/18/2020 09:59 AM   HGBA1C 6.1 (H) 07/14/2019 09:37 AM   HGBA1C 6.1 11/27/2017 12:00 AM   Pharmacist Clinical Goal(s): Over the next 180 days, patient will work with PharmD and providers to maintain A1c goal <6.5% Current regimen:  N/A Interventions: Provided dietary and exercise recommendations Discussed appropriate goals for Hemoglobin A1c (<5.7% normal, 5.7-6.4% prediabetes) Patient education regarding the importance of diet and exercise to lower HgbA1c Patient self care activities - Over the next 180 days, patient will: Check  blood sugar if symptomatic, document, and provide at future appointments Contact provider with any episodes of hypoglycemia Increase exercise to 30 minutes daily, 5 times per week  Osteopenia Pharmacist Clinical Goal(s) Over the next 90 days, patient will work with PharmD and providers to build and maintain bone density Current regimen:  Cholecalciferol 5000 units daily Calcium carbonate/Vitamin D 1200mg /400 IU every other day Interventions: Discussed patient's elevated calcium level at last office visit Determined patient started taking calcium 1200mg  supplement every other day instead of daily Provided patient education regarding calcium content in foods Discussed 1200mg  total daily calcium intake recommended from diet and supplements Discussed the importance of weight bearing  exercises three times weekly Patient self care activities - Over the next 90 days, patient will: Determine how much calcium she is getting through dietary sources daily Review calcium content in multivitamin Begin weight bearing exercises three days per week  Medication management Pharmacist Clinical Goal(s): Over the next 180 days, patient will work with PharmD and providers to maintain optimal medication adherence Current pharmacy: [facility] Interventions Comprehensive medication review performed. Continue current medication management strategy Patient self care activities - Over the next 180 days, patient will: Focus on medication adherence by using a pill box Take medications as prescribed Report any questions or concerns to PharmD and/or provider(s)  Initial goal documentation         Follow-Up:  Pharmacist Review  Orlando Penner, CPP Notified.  Raynelle Highland, [place] Pharmacist Assistant [phone]  I have reviewed the care management and care coordination activities outlined in this encounter and I am certifying that I agree with the content of this note. No further action required.  9 minutes spent in review, coordination, and documentation.  Mayford Knife, [facility] 08/01/20 1:18 PM

## 2020-08-21 ENCOUNTER — Telehealth: Payer: Self-pay

## 2020-08-21 ENCOUNTER — Other Ambulatory Visit: Payer: Self-pay | Admitting: Internal Medicine

## 2020-08-21 NOTE — Chronic Care Management (AMB) (Signed)
    Chronic Care Management Pharmacy Assistant   Name: Nissi Doffing  MRN: [id number] DOB: [birth year]  Reason for Encounter: Medication Review   PCP : Glendale Chard, MD  Allergies:  No Known Allergies  Medications: Outpatient Encounter Medications as of 08/21/2020  Medication Sig  . anastrozole (ARIMIDEX) 1 MG tablet Take 1 tablet (1 mg total) by mouth daily.  . Calcium Carbonate-Vitamin D 600-400 MG-UNIT tablet Take 1 tablet by mouth every other day. $RemoveBe'1200mg'taEVrdowB$   . Cholecalciferol (VITAMIN D3) 125 MCG (5000 UT) CAPS Take by mouth.  . hydrochlorothiazide (MICROZIDE) 12.5 MG capsule Take 1 capsule by mouth once daily  . lisinopril (ZESTRIL) 10 MG tablet Take 1 tablet by mouth once daily  . Multiple Vitamins-Minerals (MULTIVITAMIN WITH MINERALS) tablet Take 1 tablet by mouth daily.  . Pitavastatin Calcium (LIVALO) 2 MG TABS Take 1 tablet (2 mg total) by mouth 3 (three) times a week. Takes on Monday Wednesday and Friday   No facility-administered encounter medications on file as of 08/21/2020.    Current Diagnosis: Patient Active Problem List   Diagnosis Date Noted  . Bilateral carpal tunnel syndrome 02/23/2020  . Pure hypercholesterolemia 01/18/2020  . Vitamin D deficiency 01/18/2020  . Class 3 severe obesity due to excess calories with serious comorbidity and body mass index (BMI) of 40.0 to 44.9 in adult ([place]) 07/14/2019  . Chronic renal disease, stage II 04/23/2018  . Other abnormal glucose 04/23/2018  . Benign hypertension with CKD (chronic kidney disease), stage II 11/27/2017  . Hip pain 12/30/2014  . Breast cancer of lower-outer quadrant of right female breast ([place]) 09/06/2014     Follow-Up:  Pharmacist Review-Reviewed chart and adherence measures. Per [employer] data Total Gaps - all measures are (0) Total Gaps- Star Measures (0).  1. Breast Cancer Screening-Met-Normal Result 2.Colorectal Cancer Screening-Met-Colorectal Cancer Screening Compliant. 3.Medication Adherence for  Hypertension-Met-Med Compliance 90-99%. 4. Wellness Bundle-AWV Performed.  Orlando Penner, CPP Notified.  Raynelle Highland, [place] Pharmacist Assistant [phone] CCM Total Time:13 minutes

## 2020-09-28 ENCOUNTER — Telehealth: Payer: Self-pay

## 2020-09-28 NOTE — Chronic Care Management (AMB) (Signed)
    Chronic Care Management Pharmacy Assistant   Name: Cynthia Barton  MRN: [id number] DOB: 03-01-1946   Reason for Encounter: General Adherence Call     Recent office visits:  None  Recent consult visits:  None  Hospital visits:  None in previous 6 months  Medications: Outpatient Encounter Medications as of 09/28/2020  Medication Sig  . anastrozole (ARIMIDEX) 1 MG tablet Take 1 tablet (1 mg total) by mouth daily.  . Calcium Carbonate-Vitamin D 600-400 MG-UNIT tablet Take 1 tablet by mouth every other day. 1200mg   . Cholecalciferol (VITAMIN D3) 125 MCG (5000 UT) CAPS Take by mouth.  . hydrochlorothiazide (MICROZIDE) 12.5 MG capsule Take 1 capsule by mouth once daily  . lisinopril (ZESTRIL) 10 MG tablet Take 1 tablet by mouth once daily  . Multiple Vitamins-Minerals (MULTIVITAMIN WITH MINERALS) tablet Take 1 tablet by mouth daily.  . Pitavastatin Calcium (LIVALO) 2 MG TABS Take 1 tablet (2 mg total) by mouth 3 (three) times a week. Takes on Monday Wednesday and Friday   No facility-administered encounter medications on file as of 09/28/2020.    Note: Called and spoke with the patient to coordinate any assistance needed. Patient voiced she is doing fine and she is not having any adherence issues with her medications at this time. I told the patient please feel free to call me at number provided over phone call if she is requiring future assistance. Patient voiced understanding.  Patient confirmed to be scheduled for a CCM Call Follow up appointment on 10/24/20 at 9:45 AM.  Patient made aware through call of PCP office visit on 01/18/21 at 10:30 AM.  Teams Message sent to [facility], NT on 09/28/20 at 5:06 PM.  Star Rating Drugs: Pitavastatin 2 MG: No fill history. Lisinopril 10 MG: 90 DS, last filled on 03/20/20 at [facility].   Orlando Penner, CPP Notified.  Raynelle Highland, [place] Pharmacist Assistant [phone] CCM Total Time: 14 Minutes

## 2020-10-02 NOTE — Progress Notes (Signed)
Patient Care Team: Glendale Chard, MD as PCP - General (Internal Medicine) Jovita Kussmaul, MD as Consulting Physician (General Surgery) Nicholas Lose, MD as Consulting Physician (Hematology and Oncology) Thea Silversmith, MD as Consulting Physician (Radiation Oncology) Rockwell Germany, RN as Registered Nurse Mauro Kaufmann, RN as Registered Nurse Holley Bouche, NP (Inactive) as Nurse Practitioner (Nurse Practitioner) Sylvan Cheese, NP as Nurse Practitioner (Hematology and Oncology) Cyril Mourning, [facility] (Inactive) as Pharmacist (Pharmacist)  DIAGNOSIS:    ICD-10-CM   1. Malignant neoplasm of lower-outer quadrant of right breast of female, estrogen receptor positive ([place])  C50.511    Z17.0     SUMMARY OF ONCOLOGIC HISTORY: Oncology History  Breast cancer of lower-outer quadrant of right female breast ([place])  08/31/2014 Mammogram   Right breast: irregular mass with spiculated margin and heterogenous calcifications central to the nipple, middle depth   08/31/2014 Breast US   Right breast: 1.5 cm irregular mass, spiculared margin, right breast LOQ, middle depth, hypoechoic with posterior acoustic shadowing   09/01/2014 Initial Biopsy   Right breast core needle bx: invasive ductal carcinoma with DCIS, grade 2, ER+ (100%), PR+ (29%), HER-2 negative (ratio 1.00), Ki-67 21%,    09/08/2014 Breast MRI   Right breast: There is a spiculated enhancing mass with washout kinetics in the lateral right breast at the level of the nipple mid to posterior third depth measuring 1.7 x 1.8 x 1.7 cm with associated clip   09/08/2014 Clinical Stage   Stage IA: T1c N0   09/30/2014 Definitive Surgery   Right lumpectomy/[facility] Marlou Starks): Invasive ductal carcinoma 1.8 cm with DCIS ER+ (100%), PR+ (29%), HER-2 repeated and remains negative (ratio 1.00), Ki-67 21%. 1 N removed and negative for malignancy (0/1).   09/30/2014 Pathologic Stage   Stage IA: pT1c pN0    09/30/2014 Oncotype testing    Score: 17 (11% ROR). No chemotherapy.   11/10/2014 - 12/09/2014 Radiation Therapy   Adjuvant RT Pablo Ledger): Right breast/ 42.72 Gy over 21 fractions. Right breast boost/ 10 Gy over 5 fractions   12/23/2014 -  Anti-estrogen oral therapy   Anastrozole 1 mg daily. Planned duration of therapy 5-10 years.   05/09/2015 Survivorship   Survivorship visit completed and copy of care plan provided to patient.     CHIEF COMPLIANT: Follow-up of right breast cancer on anastrozole therapy  INTERVAL HISTORY: Cynthia Barton is a 75 y.o. with above-mentioned history of right breast cancer treated with lumpectomy, radiation, and whois currently on anti-estrogentherapy with anastrozole. Mammogram on 10/25/19 showed no evidence of malignancy bilaterally. She presents to the clinic today for annual follow-up.   ALLERGIES:  has No Known Allergies.  MEDICATIONS:  Current Outpatient Medications  Medication Sig Dispense Refill  . anastrozole (ARIMIDEX) 1 MG tablet Take 1 tablet (1 mg total) by mouth daily. 90 tablet 3  . Calcium Carbonate-Vitamin D 600-400 MG-UNIT tablet Take 1 tablet by mouth every other day. 124m    . Cholecalciferol (VITAMIN D3) 125 MCG (5000 UT) CAPS Take by mouth.    . hydrochlorothiazide (MICROZIDE) 12.5 MG capsule Take 1 capsule by mouth once daily 90 capsule 0  . lisinopril (ZESTRIL) 10 MG tablet Take 1 tablet by mouth once daily 90 tablet 0  . Multiple Vitamins-Minerals (MULTIVITAMIN WITH MINERALS) tablet Take 1 tablet by mouth daily.    . Pitavastatin Calcium (LIVALO) 2 MG TABS Take 1 tablet (2 mg total) by mouth 3 (three) times a week. Takes on Monday Wednesday and Friday 36 tablet  3   No current facility-administered medications for this visit.    PHYSICAL EXAMINATION: ECOG PERFORMANCE STATUS: 1 - Symptomatic but completely ambulatory  Vitals:   10/03/20 1201  BP: 133/72  Pulse: 75  Resp: 16  Temp: 98.1 F (36.7 C)  SpO2: 98%   Filed Weights   10/03/20 1201  Weight:  200 lb 9.6 oz (91 kg)    BREAST: No palpable masses or nodules in either right or left breasts. No palpable axillary supraclavicular or infraclavicular adenopathy no breast tenderness or nipple discharge. (exam performed in the presence of a chaperone)  LABORATORY DATA:  I have reviewed the data as listed CMP Latest Ref Rng & Units 07/20/2020 01/18/2020 07/14/2019  Glucose 65 - 99 mg/dL 104(H) 109(H) 111(H)  BUN 8 - 27 mg/dL _0 Creatinine 0.57 - 1.00 mg/dL 0.87 0.80 0.86  Sodium 134 - 144 mmol/L 139 138 140  Potassium 3.5 - 5.2 mmol/L 4.1 4.1 4.2  Chloride 96 - 106 mmol/L 100 101 103  CO2 20 - 29 mmol/L _1 Calcium 8.7 - 10.3 mg/dL 11.2(H) 11.3(H) 10.9(H)  Total Protein 6.0 - 8.5 g/dL 7.1 7.2 7.1  Total Bilirubin 0.0 - 1.2 mg/dL 0.4 0.4 0.4  Alkaline Phos 44 - 121 IU/L 71 63 66  AST 0 - 40 IU/L _2 ALT 0 - 32 IU/L _3 Lab Results  Component Value Date   WBC 6.6 07/20/2020   HGB 14.1 07/20/2020   HCT 43.0 07/20/2020   MCV 86 07/20/2020   PLT 211 07/20/2020   NEUTROABS 4.5 04/02/2017    ASSESSMENT & PLAN:  Breast cancer of lower-outer quadrant of right female breast Right lumpectomy 09/30/2014: 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 29%, HER-2 negative ratio 1, Ki-67 21%, T1 cN0 M0 stage IA Oncotype Dx 17 (Low Risk) S/P XRT  Current treatment: Antiestrogen therapy once daily for 5 years started 12/23/2014  Next year she will complete 7 years of therapy.  After that she will discontinue antiestrogen treatment.  Anastrozole toxicities: 1. Occasional hot flashes 2. Muscle aches and pains  Breast cancer surveillance:  1. Breast exam4/05/2021: No palpable nodules or concerns 2. Mammogram  5/3/2021at [facility]: Postsurgical changes breast density category B 3. Bone density 04/07/2019: T score -1.9  osteopenia: Continue with calcium and vitamin D and weightbearing exercises  Return to clinic in1 yearfor follow-up and subsequently she  can be followed on an as-needed basis..    No orders of the defined types were placed in this encounter.  The patient has a good understanding of the overall plan. she agrees with it. she will call with any problems that may develop before the next visit here.  Total time spent: 20 mins including face to face time and time spent for planning, charting and coordination of care  Rulon Eisenmenger, MD, MPH 10/03/2020  I, Molly Dorshimer, am acting as scribe for Dr. Nicholas Lose.  I have reviewed the above documentation for accuracy and completeness, and I agree with the above.

## 2020-10-03 ENCOUNTER — Other Ambulatory Visit: Payer: Self-pay

## 2020-10-03 ENCOUNTER — Inpatient Hospital Stay: Payer: Medicare PPO | Attending: Hematology and Oncology | Admitting: Hematology and Oncology

## 2020-10-03 DIAGNOSIS — Z79811 Long term (current) use of aromatase inhibitors: Secondary | ICD-10-CM | POA: Insufficient documentation

## 2020-10-03 DIAGNOSIS — Z170 Estrogen receptor positive status [ER+]: Secondary | ICD-10-CM | POA: Diagnosis not present

## 2020-10-03 DIAGNOSIS — Z923 Personal history of irradiation: Secondary | ICD-10-CM | POA: Diagnosis not present

## 2020-10-03 DIAGNOSIS — C50511 Malignant neoplasm of lower-outer quadrant of right female breast: Secondary | ICD-10-CM

## 2020-10-03 DIAGNOSIS — Z79899 Other long term (current) drug therapy: Secondary | ICD-10-CM | POA: Insufficient documentation

## 2020-10-03 DIAGNOSIS — M8580 Other specified disorders of bone density and structure, unspecified site: Secondary | ICD-10-CM | POA: Insufficient documentation

## 2020-10-03 MED ORDER — BLACK ELDERBERRY(BERRY-FLOWER) 575 MG PO CAPS: 1.0000 | ORAL_CAPSULE | Freq: Every day | ORAL | Status: AC

## 2020-10-03 MED ORDER — ANASTROZOLE 1 MG PO TABS: 1.0000 mg | ORAL_TABLET | Freq: Every day | ORAL | 3 refills | Status: DC

## 2020-10-03 MED ORDER — VITAMIN C 1000 MG PO TABS: 1000.0000 mg | ORAL_TABLET | Freq: Every day | ORAL | Status: DC

## 2020-10-03 NOTE — Assessment & Plan Note (Signed)
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 29%, HER-2 negative ratio 1, Ki-67 21%, T1 cN0 M0 stage IA Oncotype Dx 17 (Low Risk) S/P XRT  Current treatment: Antiestrogen therapy once daily for 5 years started 12/23/2014  I discussed the pros and cons of extended adjuvant therapy.  There is data to support 7 years of antiestrogen therapy being superior to 5.  We discussed that the biggest risk would be worsening bone density.  Anastrozole toxicities: 1. Occasional hot flashes 2. Muscle aches and pains  Breast cancer surveillance:  1. Breast exam4/05/2021: No palpable nodules or concerns 2. Mammogram  5/3/2021at [facility]: Postsurgical changes breast density category B 3. Bone density 04/07/2019: T score -1.9  osteopenia: Continue with calcium and vitamin D and weightbearing exercises  Return to clinic in1 yearfor follow-up.

## 2020-10-06 ENCOUNTER — Ambulatory Visit: Payer: Medicare PPO | Admitting: Hematology and Oncology

## 2020-10-23 ENCOUNTER — Telehealth: Payer: Self-pay

## 2020-10-23 NOTE — Progress Notes (Signed)
Called patient for appointment reminder with Orlando Penner [facility] on 10-24-2020 at 9:45. Reminded patient to have medications/supplements and any logs near by to review. Patient verbalized understanding.  [place]  [phone]

## 2020-10-24 ENCOUNTER — Ambulatory Visit (INDEPENDENT_AMBULATORY_CARE_PROVIDER_SITE_OTHER): Payer: Medicare PPO

## 2020-10-24 DIAGNOSIS — N182 Chronic kidney disease, stage 2 (mild): Secondary | ICD-10-CM

## 2020-10-24 DIAGNOSIS — I129 Hypertensive chronic kidney disease with stage 1 through stage 4 chronic kidney disease, or unspecified chronic kidney disease: Secondary | ICD-10-CM

## 2020-10-24 NOTE — Progress Notes (Signed)
Chronic Care Management Pharmacy Note  11/25/8935 Name:  Danikah Budzik MRN:  [id number] DOB:  [birth year]  Subjective: Cynthia Barton is an 75 y.o. year old female who is a primary patient of Glendale Chard, MD.  The CCM team was consulted for assistance with disease management and care coordination needs.  Patient reports that she had a wonderful birthday with her children and grandchildren. She really enjoyed herself and she was given gifts. Patient reports that her normal routine is watching the Good Morning show, and then fix her cereal, take your medication and drink 8 ounces of water. She usually goes outside and sits on the patio or sometimes she does gardening. She has been retired for about five years. She sleeps very well, she can stay up until 2 AM and watch TV and then she wakes up around 7:30. Sometimes she takes a nap on her recliner during the day but not everyday. She eats her dinner between 4 pm and 5 pm. She is going to [employer] for her play daughters graduation with her doctorate.   Engaged with patient by telephone for follow up visit in response to provider referral for pharmacy case management and/or care coordination services.   Consent to Services:  The patient was given information about Chronic Care Management services, agreed to services, and gave verbal consent prior to initiation of services.  Please see initial visit note for detailed documentation.   Patient Care Team: Glendale Chard, MD as PCP - General (Internal Medicine) Jovita Kussmaul, MD as Consulting Physician (General Surgery) Nicholas Lose, MD as Consulting Physician (Hematology and Oncology) Thea Silversmith, MD as Consulting Physician (Radiation Oncology) Rockwell Germany, RN as Registered Nurse Mauro Kaufmann, RN as Registered Nurse Holley Bouche, NP (Inactive) as Nurse Practitioner (Nurse Practitioner) Sylvan Cheese, NP as Nurse Practitioner (Hematology and Oncology) Cyril Mourning, [facility] (Inactive) as Pharmacist (Pharmacist)  Recent office visits: 07/20/2020 PCP OV   Recent consult visits: 11/07/2020- Mammogram  10/03/2020- Dr. Nicholas Lose Oncology OV - continue Anastrazole 1 mg tablet   Hospital visits: None in previous 6 months  Objective:  Lab Results  Component Value Date   CREATININE 0.87 07/20/2020   BUN 11 07/20/2020   GFRNONAA 66 07/20/2020   GFRAA 76 07/20/2020   NA 139 07/20/2020   K 4.1 07/20/2020   CALCIUM 11.2 (H) 07/20/2020   CO2 24 07/20/2020   GLUCOSE 104 (H) 07/20/2020    Lab Results  Component Value Date/Time   HGBA1C 6.3 (H) 07/20/2020 11:23 AM   HGBA1C 6.4 (H) 01/18/2020 09:59 AM   HGBA1C 6.1 11/27/2017 12:00 AM   MICROALBUR 10 07/20/2020 03:24 PM   MICROALBUR 30 07/14/2019 10:49 AM    Last diabetic Eye exam: No results found for: HMDIABEYEEXA  Last diabetic Foot exam: No results found for: HMDIABFOOTEX   Lab Results  Component Value Date   CHOL 169 01/18/2020   HDL 54 01/18/2020   LDLCALC 99 01/18/2020   TRIG 84 01/18/2020   CHOLHDL 3.1 01/18/2020    Hepatic Function Latest Ref Rng & Units 07/20/2020 01/18/2020 07/14/2019  Total Protein 6.0 - 8.5 g/dL 7.1 7.2 7.1  Albumin 3.7 - 4.7 g/dL 4.2 4.2 4.0  AST 0 - 40 IU/L '30 28 25  ' ALT 0 - 32 IU/L '19 22 20  ' Alk Phosphatase 44 - 121 IU/L 71 63 66  Total Bilirubin 0.0 - 1.2 mg/dL 0.4 0.4 0.4    Lab Results  Component Value Date/Time  TSH 2.180 07/14/2019 09:37 AM    CBC Latest Ref Rng & Units 07/20/2020 01/18/2020 01/12/2019  WBC 3.4 - 10.8 x10E3/uL 6.6 5.6 5.8  Hemoglobin 11.1 - 15.9 g/dL 14.1 13.8 13.9  Hematocrit 34.0 - 46.6 % 43.0 43.2 43.0  Platelets 150 - 450 x10E3/uL 211 216 185    Lab Results  Component Value Date/Time   VD25OH 64.1 01/18/2020 09:59 AM    Clinical ASCVD: No  The 10-year ASCVD risk score Mikey Bussing DC Jr., et al., 2013) is: 15.1%   Values used to calculate the score:     Age: 67 years     Sex: Female     Is Non-Hispanic African  American: Yes     Diabetic: No     Tobacco smoker: No     Systolic Blood Pressure: 981 mmHg     Is BP treated: No     HDL Cholesterol: 54 mg/dL     Total Cholesterol: 169 mg/dL    Depression screen [facility] 2/9 07/20/2020 01/17/2020 07/14/2019  Decreased Interest 0 0 0  Down, Depressed, Hopeless 0 0 0  PHQ - 2 Score 0 0 0  Altered sleeping - - 0  Tired, decreased energy - - 0  Change in appetite - - 0  Feeling bad or failure about yourself  - - 0  Trouble concentrating - - 0  Moving slowly or fidgety/restless - - 0  Suicidal thoughts - - 0  PHQ-9 Score - - 0  Difficult doing work/chores - - Not difficult at all     Social History   Tobacco Use  Smoking Status Never Smoker  Smokeless Tobacco Never Used   BP Readings from Last 3 Encounters:  10/03/20 133/72  07/20/20 140/82  07/20/20 140/82   Pulse Readings from Last 3 Encounters:  10/03/20 75  07/20/20 80  07/20/20 80   Wt Readings from Last 3 Encounters:  10/03/20 200 lb 9.6 oz (91 kg)  07/20/20 199 lb 11.8 oz (90.6 kg)  07/20/20 199 lb 12.8 oz (90.6 kg)   BMI Readings from Last 3 Encounters:  10/03/20 40.52 kg/m  07/20/20 40.34 kg/m  07/20/20 40.35 kg/m    Assessment/Interventions: Review of patient past medical history, allergies, medications, health status, including review of consultants reports, laboratory and other test data, was performed as part of comprehensive evaluation and provision of chronic care management services.   SDOH:  (Social Determinants of Health) assessments and interventions performed: Yes  SDOH Screenings   Alcohol Screen: Not on file  Depression (PHQ2-9): Low Risk   . PHQ-2 Score: 0  Financial Resource Strain: Low Risk   . Difficulty of Paying Living Expenses: Not hard at all  Food Insecurity: No Food Insecurity  . Worried About Charity fundraiser in the Last Year: Never true  . Ran Out of Food in the Last Year: Never true  Housing: Not on file  Physical Activity: Inactive  .  Days of Exercise per Week: 0 days  . Minutes of Exercise per Session: 0 min  Social Connections: Not on file  Stress: No Stress Concern Present  . Feeling of Stress : Not at all  Tobacco Use: Low Risk   . Smoking Tobacco Use: Never Smoker  . Smokeless Tobacco Use: Never Used  Transportation Needs: No Transportation Needs  . Lack of Transportation (Medical): No  . Lack of Transportation (Non-Medical): No    CCM Care Plan  No Known Allergies  Medications Reviewed Today    Reviewed by  Mayford Knife, RPH (Pharmacist) on 10/24/20 at 1037  Med List Status: <None>  Medication Order Taking? Sig Documenting Provider Last Dose Status Informant  anastrozole (ARIMIDEX) 1 MG tablet [id number] Yes Take 1 tablet (1 mg total) by mouth daily. Nicholas Lose, MD Taking Active   Ascorbic Acid (VITAMIN C) 1000 MG tablet [id number] Yes Take 1 tablet (1,000 mg total) by mouth daily. Nicholas Lose, MD Taking Active   Black Elderberry,Berry-Flower, [place] [id number] Yes Take 1 capsule by mouth daily. Nicholas Lose, MD Taking Active   Calcium Carbonate-Vitamin D 600-400 MG-UNIT tablet [id number] Yes Take 1 tablet by mouth every other day. 1226m [provider] Taking Active   Cholecalciferol (VITAMIN D3) 125 MCG (5000 UT) CAPS [id number] Take by mouth. [provider] Taking Active   hydrochlorothiazide (MICROZIDE) 12.5 MG capsule [id number] Take 1 capsule by mouth once daily SGlendale Chard MD Taking Active   lisinopril (ZESTRIL) 10 MG tablet [id number] Take 1 tablet by mouth once daily SGlendale Chard MD Taking Active   Multiple Vitamins-Minerals (MULTIVITAMIN WITH MINERALS) tablet [id number] Take 1 tablet by mouth daily. [provider] Taking Active Self  Pitavastatin Calcium (LIVALO) 2 MG TABS [id number] Take 1 tablet (2 mg total) by mouth 3 (three) times a week. Takes on Monday Wednesday and Friday SGlendale Chard MD Taking Active           Patient Active  Problem List   Diagnosis Date Noted  . Bilateral carpal tunnel syndrome 02/23/2020  . Pure hypercholesterolemia 01/18/2020  . Vitamin D deficiency 01/18/2020  . Class 3 severe obesity due to excess calories with serious comorbidity and body mass index (BMI) of 40.0 to 44.9 in adult ([place] 07/14/2019  . Chronic renal disease, stage II 04/23/2018  . Other abnormal glucose 04/23/2018  . Benign hypertension with CKD (chronic kidney disease), stage II 11/27/2017  . Hip pain 12/30/2014  . Breast cancer of lower-outer quadrant of right female breast ([place] 09/06/2014    Immunization History  Administered Date(s) Administered  . Fluad Quad(high Dose 65+) 07/20/2020  . Influenza, High Dose Seasonal PF 04/23/2018, 03/22/2019  . PFIZER(Purple Top)SARS-COV-2 Vaccination 07/30/2019, 08/20/2019, 05/31/2020  . Zoster Recombinat (Shingrix) 02/15/2019, 04/16/2019    Conditions to be addressed/monitored:  Hypertension, Hyperlipidemia and Chronic Kidney Disease  Care Plan : [place] Updates made by PMayford Knife [place] 10/26/2020 12:00 AM    Problem: HTN,HLD, CKD   Priority: High    Long-Range Goal: Disease Management   This Visit's Progress: On track  Priority: High  Note:     Current Barriers:  . Unable to independently monitor therapeutic efficacy  Pharmacist Clinical Goal(s):  .Marland KitchenPatient will achieve adherence to monitoring guidelines and medication adherence to achieve therapeutic efficacy through collaboration with PharmD and provider.   Interventions: . 1:1 collaboration with SGlendale Chard MD regarding development and update of comprehensive plan of care as evidenced by provider attestation and co-signature . Inter-disciplinary care team collaboration (see longitudinal plan of care) . Comprehensive medication review performed; medication list updated in electronic medical record  Hypertension (BP goal <130/80) -Controlled -Current treatment: . Lisinopril 10  mg tablet once per day . Hydrochlorothiazide 12.5 mg capsule daily  -Current home readings: patient is checking her BP once or twice per week. She reports her BP readings have been good.  -Denies hypotensive/hypertensive symptoms -Educated on BP goals and benefits of medications for prevention of heart attack, stroke and kidney damage; Daily salt  intake goal < 2300 mg; Exercise goal of 150 minutes per week; Proper BP monitoring technique; -Counseled to monitor BP at home at least three times per week, document, and provide log at future appointments -Recommended to continue current medication  Hyperlipidemia: (LDL goal < 100) -Controlled -Current treatment: . Livalo 2 mg tablet once per day  -Current dietary patterns: she is eating [employer] - sometimes she has meatloaf, fish, stew beef, spaghetti with a side. She tries to have a full course meal at night. She tries to avoid a lot of fried and fatty foods but she does have [employer] sometimes.  -Current exercise habits: patient [facility] She has silver sneakers that is a free program, but she gets worried when people are coughing. She reports that she has a [place] and a treadmill in her basement. Sometimes she feels lazy.  Patient reports that she also power washes her house. Her goal is to go back to the [facility] five days a week.  -Educated on Cholesterol goals;  Benefits of statin for ASCVD risk reduction; Importance of limiting foods high in cholesterol; -Counseled on diet and exercise extensively Recommended to continue current medication   Chronic Kidney Disease: CKD Stage 2 -Controlled -Current treatment  . Lisinopril 10 mg tablet daily  . Pitavastatin 2 mg tablet three times per week on M,W,F.  . Hydrochlorothiazide 12.5 mg capsule once per day  -Recommended to continue current medication Recommended patient drink at least 64 ounces of water daily.  -Continue close follow up with the PCP team.     Health Maintenance -Vaccine gaps:   -Patient eligible for COVID -19 second booster   -Scheduled patients appointment for 2nd booster to be completed on May 3rd at                                 12:00 at [facility] on [address].  -Current therapy:  . Womens 50 +  Daily Multivitamin - once per day  . Calcium Carbonate - Vitamin D 600- 400 MG - taking 1 tablet by mouth daily  . Vitamin D3 125 MCG (5000 UT) - take by mouth daily  . Black Elderberry, Berry- Flower 575 mg capsule once  -Educated on Herbal supplement research is limited and benefits usually cannot be proven Cost vs benefit of each product must be carefully weighed by individual consumer -Patient is satisfied with current therapy and denies issues -Recommended to continue current medication  -Patient requested that I contact daughter and let her know she is getting the COVID-19 seconds booster. -Notified daughter of second booster shot appointment.     Patient Goals/Self-Care Activities . Patient will:  - take medications as prescribed target a minimum of 150 minutes of moderate intensity exercise weekly  Follow Up Plan: Telephone follow up appointment with care management team member scheduled for: 01/24/2021 The patient has been provided with contact information for the care management team and has been advised to call with any health related questions or concerns.       Medication Assistance: None required.  Patient affirms current coverage meets needs.  Patient's preferred pharmacy is:  [facility] ([facility] [place] - [address] [place]) [place] [address] Phone: [phone] Fax: [phone]  [place]  [place] [address] Phone: [phone] Fax: [phone]  Uses pill box? Yes Pt endorses 90% compliance  We discussed: Benefits of medication synchronization, packaging and  delivery as well as enhanced pharmacist oversight with [employer]. Patient decided to: Continue current medication management strategy  Care Plan and Follow Up Patient Decision:  Patient agrees to Care Plan and Follow-up.  Plan: Telephone follow up appointment with care management team member scheduled for:  01/24/2021 and The patient has been provided with contact information for the care management team and has been advised to call with any health related questions or concerns.  Orlando Penner, PharmD  Clinical Pharmacist [facility] [phone]

## 2020-10-26 NOTE — Patient Instructions (Signed)
Visit Information It was great speaking with you today!  Please let me know if you have any questions about our visit.  Goals Addressed            This Visit's Progress   . Track and Manage My Blood Pressure-Hypertension       Timeframe:  Long-Range Goal Priority:  High Start Date:                             Expected End Date:                       Follow Up Date 01/24/2021   - check blood pressure 3 times per week - choose a place to take my blood pressure (home, clinic or office, retail store) - write blood pressure results in a log or diary    Why is this important?    You won't feel high blood pressure, but it can still hurt your blood vessels.   High blood pressure can cause heart or kidney problems. It can also cause a stroke.   Making lifestyle changes like losing a little weight or eating less salt will help.   Checking your blood pressure at home and at different times of the day can help to control blood pressure.   If the doctor prescribes medicine remember to take it the way the doctor ordered.   Call the office if you cannot afford the medicine or if there are questions about it.     Notes:        Patient Care Plan: CCM Pharmacy Care Plan    Problem Identified: HTN,HLD, CKD   Priority: High    Long-Range Goal: Disease Management   This Visit's Progress: On track  Priority: High  Note:     Current Barriers:  . Unable to independently monitor therapeutic efficacy  Pharmacist Clinical Goal(s):  Marland Kitchen Patient will achieve adherence to monitoring guidelines and medication adherence to achieve therapeutic efficacy through collaboration with PharmD and provider.   Interventions: . 1:1 collaboration with Glendale Chard, MD regarding development and update of comprehensive plan of care as evidenced by provider attestation and co-signature . Inter-disciplinary care team collaboration (see longitudinal plan of care) . Comprehensive medication review  performed; medication list updated in electronic medical record  Hypertension (BP goal <130/80) -Controlled -Current treatment: . Lisinopril 10 mg tablet once per day . Hydrochlorothiazide 12.5 mg capsule daily  -Current home readings: patient is checking her BP once or twice per week. She reports her BP readings have been good.  -Denies hypotensive/hypertensive symptoms -Educated on BP goals and benefits of medications for prevention of heart attack, stroke and kidney damage; Daily salt intake goal < 2300 mg; Exercise goal of 150 minutes per week; Proper BP monitoring technique; -Counseled to monitor BP at home at least three times per week, document, and provide log at future appointments -Recommended to continue current medication  Hyperlipidemia: (LDL goal < 100) -Controlled -Current treatment: . Livalo 2 mg tablet once per day  -Current dietary patterns: she is eating [employer] - sometimes she has meatloaf, fish, stew beef, spaghetti with a side. She tries to have a full course meal at night. She tries to avoid a lot of fried and fatty foods but she does have [employer] sometimes.  -Current exercise habits: patient reports [facility] She has silver sneakers that is a free program,  but she gets worried when people are coughing. She reports that she has a [place] and a treadmill in her basement. Sometimes she feels lazy.  Patient reports that she also power washes her house. Her goal is to go back to the [facility] five days a week.  -Educated on Cholesterol goals;  Benefits of statin for ASCVD risk reduction; Importance of limiting foods high in cholesterol; -Counseled on diet and exercise extensively Recommended to continue current medication   Chronic Kidney Disease: CKD Stage 2 -Controlled -Current treatment  . Lisinopril 10 mg tablet daily  . Pitavastatin 2 mg tablet three times per week on M,W,F.  . Hydrochlorothiazide 12.5 mg capsule once per day   -Recommended to continue current medication Recommended patient drink at least 64 ounces of water daily.  -Continue close follow up with the PCP team.    Health Maintenance -Vaccine gaps:   -Patient eligible for COVID -19 second booster   -Scheduled patients appointment for 2nd booster to be completed on May 3rd at                                 [facility] [address]  -Current therapy:  . Womens 50 +  Daily Multivitamin - once per day  . Calcium Carbonate - Vitamin D 600- 400 MG - taking 1 tablet by mouth daily  . Vitamin D3 125 MCG (5000 UT) - take by mouth daily  . Black Elderberry, Berry- Flower 575 mg capsule once  -Educated on Herbal supplement research is limited and benefits usually cannot be proven Cost vs benefit of each product must be carefully weighed by individual consumer -Patient is satisfied with current therapy and denies issues -Recommended to continue current medication  -Patient requested that I contact daughter and let her know she is getting the COVID-19 seconds booster. -Notified daughter of second booster shot appointment.     Patient Goals/Self-Care Activities . Patient will:  - take medications as prescribed target a minimum of 150 minutes of moderate intensity exercise weekly  Follow Up Plan: Telephone follow up appointment with care management team member scheduled for: 01/24/2021 The patient has been provided with contact information for the care management team and has been advised to call with any health related questions or concerns.       Patient agreed to services and verbal consent obtained.   The patient verbalized understanding of instructions, educational materials, and care plan provided today and agreed to receive a mailed copy of patient instructions, educational materials, and care plan.   Orlando Penner, PharmD Clinical Pharmacist [facility] [phone]

## 2020-11-17 DIAGNOSIS — Z1231 Encounter for screening mammogram for malignant neoplasm of breast: Secondary | ICD-10-CM | POA: Diagnosis not present

## 2020-11-17 LAB — HM MAMMOGRAPHY

## 2020-11-22 DIAGNOSIS — H524 Presbyopia: Secondary | ICD-10-CM | POA: Diagnosis not present

## 2020-11-22 DIAGNOSIS — H40013 Open angle with borderline findings, low risk, bilateral: Secondary | ICD-10-CM | POA: Diagnosis not present

## 2020-11-23 ENCOUNTER — Encounter: Payer: Self-pay | Admitting: Internal Medicine

## 2020-11-27 ENCOUNTER — Other Ambulatory Visit: Payer: Self-pay | Admitting: Hematology and Oncology

## 2020-11-27 ENCOUNTER — Other Ambulatory Visit: Payer: Self-pay | Admitting: Internal Medicine

## 2020-11-27 DIAGNOSIS — C50511 Malignant neoplasm of lower-outer quadrant of right female breast: Secondary | ICD-10-CM

## 2020-11-30 ENCOUNTER — Encounter: Payer: Self-pay | Admitting: Hematology and Oncology

## 2020-12-18 ENCOUNTER — Other Ambulatory Visit: Payer: Self-pay | Admitting: Internal Medicine

## 2020-12-23 ENCOUNTER — Other Ambulatory Visit: Payer: Self-pay | Admitting: Internal Medicine

## 2020-12-28 ENCOUNTER — Encounter: Payer: Self-pay | Admitting: Internal Medicine

## 2020-12-29 ENCOUNTER — Telehealth: Payer: Self-pay

## 2020-12-29 NOTE — Telephone Encounter (Signed)
Pt was notified that her handicap placard application is ready for pickup.

## 2021-01-02 ENCOUNTER — Telehealth: Payer: Self-pay

## 2021-01-02 NOTE — Chronic Care Management (AMB) (Signed)
    Chronic Care Management Pharmacy Assistant   Name: Cynthia Barton  MRN: [id number] DOB: Apr 25, 1946   Reason for Encounter: Disease State/ Hypertension   Recent office visits:  None  Recent consult visits:  None  Hospital visits:  None in previous 6 months  Medications: Outpatient Encounter Medications as of 01/02/2021  Medication Sig   anastrozole (ARIMIDEX) 1 MG tablet Take 1 tablet by mouth once daily   Ascorbic Acid (VITAMIN C) 1000 MG tablet Take 1 tablet (1,000 mg total) by mouth daily.   Black Elderberry,Berry-Flower, 575 MG CAPS Take 1 capsule by mouth daily.   Calcium Carbonate-Vitamin D 600-400 MG-UNIT tablet Take 1 tablet by mouth every other day. 1200mg    Cholecalciferol (VITAMIN D3) 125 MCG (5000 UT) CAPS Take by mouth.   hydrochlorothiazide (MICROZIDE) 12.5 MG capsule Take 1 capsule by mouth once daily   lisinopril (ZESTRIL) 10 MG tablet Take 1 tablet by mouth once daily   Multiple Vitamins-Minerals (MULTIVITAMIN WITH MINERALS) tablet Take 1 tablet by mouth daily.   Pitavastatin Calcium (LIVALO) 2 MG TABS Take 1 tablet (2 mg total) by mouth 3 (three) times a week. Takes on Monday Wednesday and Friday   No facility-administered encounter medications on file as of 01/02/2021.   01-02-2021: 1st attempt Left VM on both lines 01-12-2021: 2nd attempt Left VM on both lines 01-15-2021: 3rd attempt Left VM on both lines  Care Gaps: RAF= 0.662 PCV13 overdue Covid booster overdue   Star Rating Drugs: Lisinopril 10 mg- Last filled 12-18-2020 [facility] Livalo 2 mg- Last filled 05-17-2020 [place] Clinical Pharmacist Assistant [phone]

## 2021-01-18 ENCOUNTER — Other Ambulatory Visit: Payer: Self-pay

## 2021-01-18 ENCOUNTER — Encounter: Payer: Self-pay | Admitting: Internal Medicine

## 2021-01-18 ENCOUNTER — Ambulatory Visit: Payer: Medicare PPO | Admitting: Internal Medicine

## 2021-01-18 VITALS — BP 130/78 | HR 77 | Temp 97.7°F | Ht 59.0 in | Wt 195.8 lb

## 2021-01-18 DIAGNOSIS — N182 Chronic kidney disease, stage 2 (mild): Secondary | ICD-10-CM | POA: Diagnosis not present

## 2021-01-18 DIAGNOSIS — I129 Hypertensive chronic kidney disease with stage 1 through stage 4 chronic kidney disease, or unspecified chronic kidney disease: Secondary | ICD-10-CM

## 2021-01-18 DIAGNOSIS — R7309 Other abnormal glucose: Secondary | ICD-10-CM

## 2021-01-18 DIAGNOSIS — Z6839 Body mass index (BMI) 39.0-39.9, adult: Secondary | ICD-10-CM

## 2021-01-18 LAB — CMP14+EGFR
ALT: 20 IU/L (ref 0–32)
AST: 31 IU/L (ref 0–40)
Albumin/Globulin Ratio: 1.5 (ref 1.2–2.2)
Albumin: 4.1 g/dL (ref 3.7–4.7)
Alkaline Phosphatase: 67 IU/L (ref 44–121)
BUN/Creatinine Ratio: 13 (ref 12–28)
BUN: 11 mg/dL (ref 8–27)
Bilirubin Total: 0.4 mg/dL (ref 0.0–1.2)
CO2: 25 mmol/L (ref 20–29)
Calcium: 11.1 mg/dL — ABNORMAL HIGH (ref 8.7–10.3)
Chloride: 103 mmol/L (ref 96–106)
Creatinine, Ser: 0.83 mg/dL (ref 0.57–1.00)
Globulin, Total: 2.8 g/dL (ref 1.5–4.5)
Glucose: 105 mg/dL — ABNORMAL HIGH (ref 65–99)
Potassium: 4.3 mmol/L (ref 3.5–5.2)
Sodium: 140 mmol/L (ref 134–144)
Total Protein: 6.9 g/dL (ref 6.0–8.5)
eGFR: 73 mL/min/{1.73_m2} (ref >59)

## 2021-01-18 LAB — LIPID PANEL
Chol/HDL Ratio: 2.9 ratio (ref 0.0–4.4)
Cholesterol, Total: 157 mg/dL (ref 100–199)
HDL: 55 mg/dL (ref >39)
LDL Chol Calc (NIH): 87 mg/dL (ref 0–99)
Triglycerides: 76 mg/dL (ref 0–149)
VLDL Cholesterol Cal: 15 mg/dL (ref 5–40)

## 2021-01-18 LAB — HEMOGLOBIN A1C
Est. average glucose Bld gHb Est-mCnc: 131 mg/dL
Hgb A1c MFr Bld: 6.2 % — ABNORMAL HIGH (ref 4.8–5.6)

## 2021-01-18 NOTE — Progress Notes (Signed)
I,Yamilka [employer] as a Education administrator for [name], MD.,have documented all relevant documentation on the behalf of [name], MD,as directed by  [name], MD while in the presence of [name], MD.  This visit occurred during the SARS-CoV-2 public health emergency.  Safety protocols were in place, including screening questions prior to the visit, additional usage of staff PPE, and extensive cleaning of exam room while observing appropriate contact time as indicated for disinfecting solutions.  Subjective:     Patient ID: Cynthia Barton , female    DOB: Jan 29, 1946 , 75 y.o.   MRN: [id number]   Chief Complaint  Patient presents with   Hypertension    HPI  Patient presents today for a blood pressure f/u. She reports compliance with meds. She denies having any headaches, chest pain and shortness of breath. She reports feeling well. Unfortunately states she has had a lot of deaths in her family recently.   Hypertension This is a chronic problem. The current episode started more than 1 year ago. The problem has been gradually improving since onset. The problem is controlled. Pertinent negatives include no blurred vision or palpitations. Risk factors for coronary artery disease include dyslipidemia, post-menopausal state, obesity and sedentary lifestyle. The current treatment provides moderate improvement. Compliance problems include exercise.  Hypertensive end-organ damage includes kidney disease.    Past Medical History:  Diagnosis Date   Bilateral carpal tunnel syndrome 02/23/2020   Breast cancer ([place]) 09/07/2014   ER+/PR+/Her2- right breast   Breast cancer of lower-outer quadrant of right female breast ([place]) 09/06/2014   Hyperlipidemia    Hypertension    Radiation 11/10/14-12/09/14   Right Breast   Uterine prolapse 2016   Wears dentures    top   Wears glasses      Family History  Problem Relation Age of Onset   Heart disease Father    Melanoma Mother     Kidney failure Sister 86   Heart disease Maternal Uncle      Current Outpatient Medications:    anastrozole (ARIMIDEX) 1 MG tablet, Take 1 tablet by mouth once daily, Disp: 90 tablet, Rfl: 3   Ascorbic Acid (VITAMIN C) 1000 MG tablet, Take 1 tablet (1,000 mg total) by mouth daily., Disp: , Rfl:    Black Elderberry,Berry-Flower, 575 MG CAPS, Take 1 capsule by mouth daily., Disp: , Rfl:    Calcium Carbonate-Vitamin D 600-400 MG-UNIT tablet, Take 1 tablet by mouth every other day. 1245m, Disp: , Rfl:    Cholecalciferol (VITAMIN D3) 125 MCG (5000 UT) CAPS, Take by mouth., Disp: , Rfl:    hydrochlorothiazide (MICROZIDE) 12.5 MG capsule, Take 1 capsule by mouth once daily, Disp: 90 capsule, Rfl: 0   lisinopril (ZESTRIL) 10 MG tablet, Take 1 tablet by mouth once daily, Disp: 90 tablet, Rfl: 0   Multiple Vitamins-Minerals (MULTIVITAMIN WITH MINERALS) tablet, Take 1 tablet by mouth daily., Disp: , Rfl:    Pitavastatin Calcium (LIVALO) 2 MG TABS, Take 1 tablet (2 mg total) by mouth 3 (three) times a week. Takes on Monday Wednesday and Friday, Disp: 36 tablet, Rfl: 3   No Known Allergies   Review of Systems  Constitutional: Negative.   Eyes:  Negative for blurred vision.  Respiratory: Negative.    Cardiovascular: Negative.  Negative for palpitations.  Gastrointestinal: Negative.   Psychiatric/Behavioral: Negative.    All other systems reviewed and are negative.   Today's Vitals   01/18/21 1048  BP: 130/78  Pulse: 77  Temp: 97.7 F (36.5 C)  TempSrc: Oral  Weight: 195 lb 12.8 oz (88.8 kg)  Height: '4\' 11"'  (1.499 m)  PainSc: 0-No pain   Body mass index is 39.55 kg/m.  Wt Readings from Last 3 Encounters:  01/18/21 195 lb 12.8 oz (88.8 kg)  10/03/20 200 lb 9.6 oz (91 kg)  07/20/20 199 lb 11.8 oz (90.6 kg)    BP Readings from Last 3 Encounters:  01/18/21 130/78  10/03/20 133/72  07/20/20 140/82    Objective:  Physical Exam Vitals and nursing note reviewed.  Constitutional:       Appearance: Normal appearance.  HENT:     Head: Normocephalic and atraumatic.     Nose:     Comments: Masked     Mouth/Throat:     Comments: Masked  Cardiovascular:     Rate and Rhythm: Normal rate and regular rhythm.     Heart sounds: Normal heart sounds.  Pulmonary:     Effort: Pulmonary effort is normal.     Breath sounds: Normal breath sounds.  Lymphadenopathy:     Cervical: No cervical adenopathy.  Skin:    General: Skin is warm.  Neurological:     General: No focal deficit present.     Mental Status: She is alert.  Psychiatric:        Mood and Affect: Mood normal.        Behavior: Behavior normal.        Assessment And Plan:     1. Benign hypertension with CKD (chronic kidney disease), stage II Comments: Chronic, controlled. NO med changes today. Encouraged to follow low sodium diet.  - CMP14+EGFR - Hemoglobin A1c - Lipid panel  2. Chronic renal disease, stage II Comments: Chronic, importance of adequate hydration was d/w pt.   3. Other abnormal glucose Comments: Her a1c has been elevated in the past. I will recheck this today. Advised to limit her intake of sweetened beverages and refined carbs.   4. Class 2 severe obesity due to excess calories with serious comorbidity and body mass index (BMI) of 39.0 to 39.9 in adult [facility])  She is encouraged to strive for BMI less than 30 to decrease cardiac risk. Advised to aim for at least 150 minutes of exercise per week. She was commended for walking in the mall every morning.    Patient was given opportunity to ask questions. Patient verbalized understanding of the plan and was able to repeat key elements of the plan. All questions were answered to their satisfaction.   I, [name], MD, have reviewed all documentation for this visit. The documentation on 01/18/21 for the exam, diagnosis, procedures, and orders are all accurate and complete.   IF YOU HAVE BEEN REFERRED TO A SPECIALIST, IT MAY TAKE 1-2 WEEKS TO  SCHEDULE/PROCESS THE REFERRAL. IF YOU HAVE NOT HEARD FROM US/SPECIALIST IN TWO WEEKS, PLEASE GIVE [place] A CALL AT [phone].   THE PATIENT IS ENCOURAGED TO PRACTICE SOCIAL DISTANCING DUE TO THE COVID-19 PANDEMIC.

## 2021-01-18 NOTE — Patient Instructions (Signed)

## 2021-01-22 ENCOUNTER — Telehealth: Payer: Self-pay

## 2021-01-22 NOTE — Chronic Care Management (AMB) (Signed)
Called patient to reschedule telephone appointment with Orlando Penner CPP on 01-23-2021 at 9:45. No answer left voicemail to reschedule.  [place] Pharmacist Assistant [phone]

## 2021-01-23 ENCOUNTER — Telehealth: Payer: Self-pay

## 2021-02-05 ENCOUNTER — Other Ambulatory Visit: Payer: Medicare PPO

## 2021-02-05 ENCOUNTER — Other Ambulatory Visit: Payer: Self-pay

## 2021-02-06 LAB — PTH, INTACT AND CALCIUM
Calcium: 10.8 mg/dL — ABNORMAL HIGH (ref 8.7–10.3)
PTH: 26 pg/mL (ref 15–65)

## 2021-02-08 DIAGNOSIS — H40013 Open angle with borderline findings, low risk, bilateral: Secondary | ICD-10-CM | POA: Diagnosis not present

## 2021-02-09 ENCOUNTER — Telehealth: Payer: Self-pay

## 2021-02-09 NOTE — Telephone Encounter (Signed)
Left the patient a message to call back for lab results or view on MyChart.

## 2021-02-09 NOTE — Telephone Encounter (Signed)
-----   Message from Glendale Chard, MD sent at 02/06/2021  9:38 PM EDT ----- Calcium level is slightly lower. Be sure to stay well hydrated.   How are you feeling?  RS

## 2021-02-26 ENCOUNTER — Other Ambulatory Visit: Payer: Self-pay | Admitting: Internal Medicine

## 2021-03-10 ENCOUNTER — Other Ambulatory Visit: Payer: Self-pay | Admitting: Internal Medicine

## 2021-03-15 DIAGNOSIS — Z79811 Long term (current) use of aromatase inhibitors: Secondary | ICD-10-CM | POA: Diagnosis not present

## 2021-03-15 DIAGNOSIS — R32 Unspecified urinary incontinence: Secondary | ICD-10-CM | POA: Diagnosis not present

## 2021-03-15 DIAGNOSIS — I10 Essential (primary) hypertension: Secondary | ICD-10-CM | POA: Diagnosis not present

## 2021-03-15 DIAGNOSIS — M8580 Other specified disorders of bone density and structure, unspecified site: Secondary | ICD-10-CM | POA: Diagnosis not present

## 2021-03-15 DIAGNOSIS — C50919 Malignant neoplasm of unspecified site of unspecified female breast: Secondary | ICD-10-CM | POA: Diagnosis not present

## 2021-03-15 DIAGNOSIS — Z6838 Body mass index (BMI) 38.0-38.9, adult: Secondary | ICD-10-CM | POA: Diagnosis not present

## 2021-03-15 DIAGNOSIS — I739 Peripheral vascular disease, unspecified: Secondary | ICD-10-CM | POA: Diagnosis not present

## 2021-03-15 DIAGNOSIS — E785 Hyperlipidemia, unspecified: Secondary | ICD-10-CM | POA: Diagnosis not present

## 2021-03-25 ENCOUNTER — Other Ambulatory Visit: Payer: Self-pay | Admitting: Internal Medicine

## 2021-03-27 ENCOUNTER — Telehealth: Payer: Self-pay

## 2021-03-27 NOTE — Chronic Care Management (AMB) (Signed)
    Chronic Care Management Pharmacy Assistant   Name: Cynthia Barton  MRN: [id number] DOB: 1945/09/26   Reason for Encounter: Disease State/ Hypertension  Recent office visits:  01-18-2021 Glendale Chard, MD. Glucose= 105, Calcium= 11.1. A1C= 7.2  Recent consult visits:  None  Hospital visits:  None in previous 6 months  Medications: Outpatient Encounter Medications as of 03/27/2021  Medication Sig   anastrozole (ARIMIDEX) 1 MG tablet Take 1 tablet by mouth once daily   Ascorbic Acid (VITAMIN C) 1000 MG tablet Take 1 tablet (1,000 mg total) by mouth daily.   Black Elderberry,Berry-Flower, 575 MG CAPS Take 1 capsule by mouth daily.   Calcium Carbonate-Vitamin D 600-400 MG-UNIT tablet Take 1 tablet by mouth every other day. 1200mg    Cholecalciferol (VITAMIN D3) 125 MCG (5000 UT) CAPS Take by mouth.   hydrochlorothiazide (MICROZIDE) 12.5 MG capsule Take 1 capsule by mouth once daily   lisinopril (ZESTRIL) 10 MG tablet Take 1 tablet by mouth once daily   Multiple Vitamins-Minerals (MULTIVITAMIN WITH MINERALS) tablet Take 1 tablet by mouth daily.   Pitavastatin Calcium (LIVALO) 2 MG TABS Take 1 tablet (2 mg total) by mouth 3 (three) times a week. Takes on Monday Wednesday and Friday   No facility-administered encounter medications on file as of 03/27/2021.   Reviewed chart prior to disease state call. Spoke with patient regarding BP  Recent Office Vitals: BP Readings from Last 3 Encounters:  01/18/21 130/78  10/03/20 133/72  07/20/20 140/82   Pulse Readings from Last 3 Encounters:  01/18/21 77  10/03/20 75  07/20/20 80    Wt Readings from Last 3 Encounters:  01/18/21 195 lb 12.8 oz (88.8 kg)  10/03/20 200 lb 9.6 oz (91 kg)  07/20/20 199 lb 11.8 oz (90.6 kg)     Kidney Function Lab Results  Component Value Date/Time   CREATININE 0.83 01/18/2021 11:24 AM   CREATININE 0.87 07/20/2020 11:23 AM   CREATININE 0.8 12/22/2014 01:44 PM   CREATININE 0.9 09/07/2014 12:37 PM    GFRNONAA 66 07/20/2020 11:23 AM   GFRAA 76 07/20/2020 11:23 AM    BMP Latest Ref Rng & Units 02/05/2021 01/18/2021 07/20/2020  Glucose 65 - 99 mg/dL - 105(H) 104(H)  BUN 8 - 27 mg/dL - 11 11  Creatinine 0.57 - 1.00 mg/dL - 0.83 0.87  BUN/Creat Ratio 12 - 28 - 13 13  Sodium 134 - 144 mmol/L - 140 139  Potassium 3.5 - 5.2 mmol/L - 4.3 4.1  Chloride 96 - 106 mmol/L - 103 100  CO2 20 - 29 mmol/L - 25 24  Calcium 8.7 - 10.3 mg/dL 10.8(H) 11.1(H) 11.2(H)     03-27-2021:1st attempt left VM 04-05-2021: 2nd attempt left VM on both lines 04-09-2021: 3rd attempt left VM on both lines  Care Gaps: PCV13 overdue Covid booster overdue AWV 07-26-2021  Star Rating Drugs: Lisinopril 10 mg- Last filled 03-26-2021 90 DS [facility] Livalo 2 mg- Last filled 05-17-2020 36 DS [facility] patient services  [place] Clinical Pharmacist Assistant [phone]

## 2021-04-10 DIAGNOSIS — H40013 Open angle with borderline findings, low risk, bilateral: Secondary | ICD-10-CM | POA: Diagnosis not present

## 2021-04-10 DIAGNOSIS — H25813 Combined forms of age-related cataract, bilateral: Secondary | ICD-10-CM | POA: Diagnosis not present

## 2021-05-26 ENCOUNTER — Other Ambulatory Visit: Payer: Self-pay | Admitting: Internal Medicine

## 2021-05-28 ENCOUNTER — Other Ambulatory Visit: Payer: Self-pay | Admitting: Internal Medicine

## 2021-06-19 ENCOUNTER — Telehealth: Payer: Self-pay

## 2021-06-19 NOTE — Telephone Encounter (Signed)
The patient was notified samples of livalo is ready for pickup.

## 2021-07-10 ENCOUNTER — Telehealth: Payer: Self-pay

## 2021-07-10 NOTE — Chronic Care Management (AMB) (Signed)
Chronic Care Management Pharmacy Assistant   Name: Cynthia Barton  MRN: [id number] DOB: 1945/09/21  Reason for Encounter: Disease State/ Hypertension  Recent office visits:  01-18-2021 Glendale Chard, MD. Glucose= 105, Calcium= 11.1. A1C= 6.2  Recent consult visits:  None  Hospital visits:  None in previous 6 months  Medications: Outpatient Encounter Medications as of 07/10/2021  Medication Sig   anastrozole (ARIMIDEX) 1 MG tablet Take 1 tablet by mouth once daily   Ascorbic Acid (VITAMIN C) 1000 MG tablet Take 1 tablet (1,000 mg total) by mouth daily.   Black Elderberry,Berry-Flower, 575 MG CAPS Take 1 capsule by mouth daily.   Calcium Carbonate-Vitamin D 600-400 MG-UNIT tablet Take 1 tablet by mouth every other day. 1200mg    Cholecalciferol (VITAMIN D3) 125 MCG (5000 UT) CAPS Take by mouth.   hydrochlorothiazide (MICROZIDE) 12.5 MG capsule Take 1 capsule by mouth once daily   lisinopril (ZESTRIL) 10 MG tablet Take 1 tablet by mouth once daily   Multiple Vitamins-Minerals (MULTIVITAMIN WITH MINERALS) tablet Take 1 tablet by mouth daily.   Pitavastatin Calcium (LIVALO) 2 MG TABS Take 1 tablet (2 mg total) by mouth 3 (three) times a week. Takes on Monday Wednesday and Friday   No facility-administered encounter medications on file as of 07/10/2021.  Reviewed chart prior to disease state call. Spoke with patient regarding BP  Recent Office Vitals: BP Readings from Last 3 Encounters:  01/18/21 130/78  10/03/20 133/72  07/20/20 140/82   Pulse Readings from Last 3 Encounters:  01/18/21 77  10/03/20 75  07/20/20 80    Wt Readings from Last 3 Encounters:  01/18/21 195 lb 12.8 oz (88.8 kg)  10/03/20 200 lb 9.6 oz (91 kg)  07/20/20 199 lb 11.8 oz (90.6 kg)     Kidney Function Lab Results  Component Value Date/Time   CREATININE 0.83 01/18/2021 11:24 AM   CREATININE 0.87 07/20/2020 11:23 AM   CREATININE 0.8 12/22/2014 01:44 PM   CREATININE 0.9 09/07/2014 12:37 PM    GFRNONAA 66 07/20/2020 11:23 AM   GFRAA 76 07/20/2020 11:23 AM    BMP Latest Ref Rng & Units 02/05/2021 01/18/2021 07/20/2020  Glucose 65 - 99 mg/dL - 105(H) 104(H)  BUN 8 - 27 mg/dL - 11 11  Creatinine 0.57 - 1.00 mg/dL - 0.83 0.87  BUN/Creat Ratio 12 - 28 - 13 13  Sodium 134 - 144 mmol/L - 140 139  Potassium 3.5 - 5.2 mmol/L - 4.3 4.1  Chloride 96 - 106 mmol/L - 103 100  CO2 20 - 29 mmol/L - 25 24  Calcium 8.7 - 10.3 mg/dL 10.8(H) 11.1(H) 11.2(H)    Current antihypertensive regimen:  Lisinopril 10 mg daily HCTZ 12.5 mg daily  How often are you checking your Blood Pressure? infrequently  Current home BP readings: 132/80  What recent interventions/DTPs have been made by any provider to improve Blood Pressure control since last CPP Visit:  Educated on BP goals and benefits of medications for prevention of heart attack, stroke and kidney damage; Daily salt intake goal < 2300 mg; Exercise goal of 150 minutes per week; Proper BP monitoring technique; -Counseled to monitor BP at home at least three times per week, document, and provide log at future appointments  Any recent hospitalizations or ED visits since last visit with CPP? No  What diet changes have been made to improve Blood Pressure Control?  Patient states she has limited her salt intake, drinks plenty of water and eats fruits/vegetables.  What exercise  is being done to improve your Blood Pressure Control?  Patient [place] [facility] [place] the [facility] to exercise.   Adherence Review: Is the patient currently on ACE/ARB medication? Yes Does the patient have >5 day gap between last estimated fill dates? No  NOTES: Patient states she hasn't received patient assistance paperwork for livalo. Started application for patient to sign at next appointment with Dr. Baird Cancer on 07-18-2021. Updated [place] PTM to have application at [facility] for patient. Patient has 21 days worth of samples left.  Care  Gaps: PNA Vac overdue Covid booster overdue Flu vaccine overdue AWV 07-26-2021  Star Rating Drugs: Lisinopril 10 mg- Last filled 05-28-2021 90 DS [facility] Livalo 2 mg- Patient assistance   [place] Pharmacist Assistant [phone]

## 2021-07-18 ENCOUNTER — Ambulatory Visit: Payer: Medicare PPO | Admitting: Internal Medicine

## 2021-07-18 ENCOUNTER — Other Ambulatory Visit: Payer: Self-pay

## 2021-07-18 ENCOUNTER — Encounter: Payer: Self-pay | Admitting: Internal Medicine

## 2021-07-18 VITALS — BP 128/74 | HR 71 | Temp 98.1°F | Ht 59.0 in | Wt 191.2 lb

## 2021-07-18 DIAGNOSIS — H918X3 Other specified hearing loss, bilateral: Secondary | ICD-10-CM

## 2021-07-18 DIAGNOSIS — Z170 Estrogen receptor positive status [ER+]: Secondary | ICD-10-CM

## 2021-07-18 DIAGNOSIS — I129 Hypertensive chronic kidney disease with stage 1 through stage 4 chronic kidney disease, or unspecified chronic kidney disease: Secondary | ICD-10-CM

## 2021-07-18 DIAGNOSIS — C50511 Malignant neoplasm of lower-outer quadrant of right female breast: Secondary | ICD-10-CM | POA: Diagnosis not present

## 2021-07-18 DIAGNOSIS — H9190 Unspecified hearing loss, unspecified ear: Secondary | ICD-10-CM | POA: Insufficient documentation

## 2021-07-18 DIAGNOSIS — Z6838 Body mass index (BMI) 38.0-38.9, adult: Secondary | ICD-10-CM | POA: Diagnosis not present

## 2021-07-18 DIAGNOSIS — N182 Chronic kidney disease, stage 2 (mild): Secondary | ICD-10-CM | POA: Diagnosis not present

## 2021-07-18 DIAGNOSIS — R7309 Other abnormal glucose: Secondary | ICD-10-CM

## 2021-07-18 LAB — CMP14+EGFR
ALT: 21 IU/L (ref 0–32)
AST: 24 IU/L (ref 0–40)
Albumin/Globulin Ratio: 1.4 (ref 1.2–2.2)
Albumin: 4.3 g/dL (ref 3.7–4.7)
Alkaline Phosphatase: 66 IU/L (ref 44–121)
BUN/Creatinine Ratio: 16 (ref 12–28)
BUN: 14 mg/dL (ref 8–27)
Bilirubin Total: 0.6 mg/dL (ref 0.0–1.2)
CO2: 24 mmol/L (ref 20–29)
Calcium: 11.1 mg/dL — ABNORMAL HIGH (ref 8.7–10.3)
Chloride: 102 mmol/L (ref 96–106)
Creatinine, Ser: 0.9 mg/dL (ref 0.57–1.00)
Globulin, Total: 3 g/dL (ref 1.5–4.5)
Glucose: 96 mg/dL (ref 70–99)
Potassium: 4.3 mmol/L (ref 3.5–5.2)
Sodium: 139 mmol/L (ref 134–144)
Total Protein: 7.3 g/dL (ref 6.0–8.5)
eGFR: 67 mL/min/{1.73_m2} (ref >59)

## 2021-07-18 LAB — LIPID PANEL
Chol/HDL Ratio: 3 ratio (ref 0.0–4.4)
Cholesterol, Total: 178 mg/dL (ref 100–199)
HDL: 60 mg/dL (ref >39)
LDL Chol Calc (NIH): 101 mg/dL — ABNORMAL HIGH (ref 0–99)
Triglycerides: 91 mg/dL (ref 0–149)
VLDL Cholesterol Cal: 17 mg/dL (ref 5–40)

## 2021-07-18 LAB — HEMOGLOBIN A1C
Est. average glucose Bld gHb Est-mCnc: 131 mg/dL
Hgb A1c MFr Bld: 6.2 % — ABNORMAL HIGH (ref 4.8–5.6)

## 2021-07-18 NOTE — Patient Instructions (Signed)

## 2021-07-18 NOTE — Progress Notes (Signed)
I,Cynthia Barton,acting as a Education administrator for Cynthia Greenland, MD.,have documented all relevant documentation on the behalf of Cynthia Greenland, MD,as directed by  Cynthia Greenland, MD while in the presence of Cynthia Greenland, MD.  This visit occurred during the SARS-CoV-2 public health emergency.  Safety protocols were in place, including screening questions prior to the visit, additional usage of staff PPE, and extensive cleaning of exam room while observing appropriate contact time as indicated for disinfecting solutions.  Subjective:     Patient ID: Cynthia Barton , female    DOB: 1946-02-09 , 76 y.o.   MRN: [id number]   Chief Complaint  Patient presents with   Hypertension    HPI  Patient presents today for a blood pressure f/u. She reports compliance with meds. She denies having any headaches, chest pain and shortness of breath. She reports feeling well. She is accompanied by her daughter Cynthia Barton. She has no specific concerns at this time.   Hypertension This is a chronic problem. The current episode started more than 1 year ago. The problem has been gradually improving since onset. The problem is controlled. Pertinent negatives include no blurred vision or palpitations. Risk factors for coronary artery disease include dyslipidemia, post-menopausal state, obesity and sedentary lifestyle. The current treatment provides moderate improvement. Compliance problems include exercise.  Hypertensive end-organ damage includes kidney disease.    Past Medical History:  Diagnosis Date   Bilateral carpal tunnel syndrome 02/23/2020   Breast cancer ([place]) 09/07/2014   ER+/PR+/Her2- right breast   Breast cancer of lower-outer quadrant of right female breast ([place]) 09/06/2014   Hyperlipidemia    Hypertension    Radiation 11/10/14-12/09/14   Right Breast   Uterine prolapse 2016   Wears dentures    top   Wears glasses      Family History  Problem Relation Age of Onset   Heart disease Father    Melanoma Mother     Kidney failure Sister 28   Heart disease Maternal Uncle      Current Outpatient Medications:    anastrozole (ARIMIDEX) 1 MG tablet, Take 1 tablet by mouth once daily, Disp: 90 tablet, Rfl: 3   Ascorbic Acid (VITAMIN C) 1000 MG tablet, Take 1 tablet (1,000 mg total) by mouth daily., Disp: , Rfl:    Black Elderberry,Berry-Flower, 575 MG CAPS, Take 1 capsule by mouth daily., Disp: , Rfl:    Calcium Carbonate-Vitamin D 600-400 MG-UNIT tablet, Take 1 tablet by mouth every other day. 1275m, Disp: , Rfl:    Cholecalciferol (VITAMIN D3) 125 MCG (5000 UT) CAPS, Take by mouth., Disp: , Rfl:    hydrochlorothiazide (MICROZIDE) 12.5 MG capsule, Take 1 capsule by mouth once daily, Disp: 90 capsule, Rfl: 0   lisinopril (ZESTRIL) 10 MG tablet, Take 1 tablet by mouth once daily, Disp: 90 tablet, Rfl: 0   Multiple Vitamins-Minerals (MULTIVITAMIN WITH MINERALS) tablet, Take 1 tablet by mouth daily., Disp: , Rfl:    Pitavastatin Calcium (LIVALO) 2 MG TABS, Take 1 tablet (2 mg total) by mouth 3 (three) times a week. Takes on Monday Wednesday and Friday, Disp: 36 tablet, Rfl: 3   No Known Allergies   Review of Systems  Constitutional: Negative.   HENT:  Positive for hearing loss.        States she went to audiology exam last week and she needs hearing aids  Eyes:  Negative for blurred vision.  Respiratory: Negative.    Cardiovascular: Negative.  Negative for palpitations.  Gastrointestinal: Negative.  Neurological: Negative.     Today's Vitals   07/18/21 1004  BP: 128/74  Pulse: 71  Temp: 98.1 F (36.7 C)  TempSrc: Oral  Weight: 191 lb 3.2 oz (86.7 kg)  Height: '4\' 11"'  (1.499 m)   Body mass index is 38.62 kg/m.  Wt Readings from Last 3 Encounters:  07/18/21 191 lb 3.2 oz (86.7 kg)  01/18/21 195 lb 12.8 oz (88.8 kg)  10/03/20 200 lb 9.6 oz (91 kg)    Objective:  Physical Exam Vitals and nursing note reviewed.  Constitutional:      Appearance: Normal appearance.  HENT:     Head:  Normocephalic and atraumatic.     Nose:     Comments: Masked     Mouth/Throat:     Comments: Masked  Eyes:     Extraocular Movements: Extraocular movements intact.  Cardiovascular:     Rate and Rhythm: Normal rate and regular rhythm.     Heart sounds: Normal heart sounds.  Pulmonary:     Effort: Pulmonary effort is normal.     Breath sounds: Normal breath sounds.  Musculoskeletal:     Cervical back: Normal range of motion.  Skin:    General: Skin is warm.  Neurological:     General: No focal deficit present.     Mental Status: She is alert.  Psychiatric:        Mood and Affect: Mood normal.        Behavior: Behavior normal.        Assessment And Plan:     1. Benign hypertension with CKD (chronic kidney disease), stage II Comments: Chronic, well controlled. NO med changes. She is encouraged to follow low sodium diet. She will f/u in six months for physical exam.  - Lipid panel - CMP14+EGFR  2. Other abnormal glucose Comments: Her a1c has been elevated. I will recheck an a1c today. Encouraged to limit her intake of sweetened drinks, including diet drinks. - Hemoglobin A1c  3. Malignant neoplasm of lower-outer quadrant of right breast of female, estrogen receptor positive ([place]) Comments: Right lumpectomy 09/30/2014: Path: Invasive ductal carcinoma 1.8cm. She's currently on antiestrogen therapy qd fstarted 12/2014, she will complete tx later this   4. Other specified hearing loss of both ears Comments: She is waiting on shipment of hearing aids. I will request audiometry report.   5. Class 2 severe obesity due to excess calories with serious comorbidity and body mass index (BMI) of 38.0 to 38.9 in adult [facility]) Comments: She has lost 9 lbs since April 2022. She is encouraged to strive for BMI less than 30 to decrease cardiac risk. Advised to aim for at least 150 minutes of exercise per week.   Patient was given opportunity to ask questions. Patient verbalized understanding of  the plan and was able to repeat key elements of the plan. All questions were answered to their satisfaction.   I, Cynthia Greenland, MD, have reviewed all documentation for this visit. The documentation on 07/18/21 for the exam, diagnosis, procedures, and orders are all accurate and complete.   IF YOU HAVE BEEN REFERRED TO A SPECIALIST, IT MAY TAKE 1-2 WEEKS TO SCHEDULE/PROCESS THE REFERRAL. IF YOU HAVE NOT HEARD FROM US/SPECIALIST IN TWO WEEKS, PLEASE GIVE [place] A CALL AT [phone] X [phone].   THE PATIENT IS ENCOURAGED TO PRACTICE SOCIAL DISTANCING DUE TO THE COVID-19 PANDEMIC.

## 2021-07-20 ENCOUNTER — Other Ambulatory Visit: Payer: Self-pay

## 2021-07-20 DIAGNOSIS — E875 Hyperkalemia: Secondary | ICD-10-CM

## 2021-07-23 ENCOUNTER — Other Ambulatory Visit: Payer: Self-pay

## 2021-07-23 ENCOUNTER — Other Ambulatory Visit: Payer: Medicare PPO

## 2021-07-23 DIAGNOSIS — E875 Hyperkalemia: Secondary | ICD-10-CM | POA: Diagnosis not present

## 2021-07-24 LAB — PTH, INTACT AND CALCIUM
Calcium: 11.7 mg/dL — ABNORMAL HIGH (ref 8.7–10.3)
PTH: 25 pg/mL (ref 15–65)

## 2021-07-26 ENCOUNTER — Encounter: Payer: Medicare PPO | Admitting: Internal Medicine

## 2021-07-26 ENCOUNTER — Ambulatory Visit (INDEPENDENT_AMBULATORY_CARE_PROVIDER_SITE_OTHER): Payer: Medicare PPO

## 2021-07-26 ENCOUNTER — Ambulatory Visit: Payer: Medicare PPO | Admitting: Internal Medicine

## 2021-07-26 VITALS — Ht 59.0 in | Wt 191.0 lb

## 2021-07-26 DIAGNOSIS — Z0000 Encounter for general adult medical examination without abnormal findings: Secondary | ICD-10-CM

## 2021-07-26 NOTE — Progress Notes (Addendum)
I connected with [employer] today by telephone and verified that I am speaking with the correct person using two identifiers. Location patient: home Location provider: work Persons participating in the virtual visit: [employer], [employer] LPN.   I discussed the limitations, risks, security and privacy concerns of performing an evaluation and management service by telephone and the availability of in person appointments. I also discussed with the patient that there may be a patient responsible charge related to this service. The patient expressed understanding and verbally consented to this telephonic visit.    Interactive audio and video telecommunications were attempted between this provider and patient, however failed, due to patient having technical difficulties OR patient did not have access to video capability.  We continued and completed visit with audio only.     Vital signs may be patient reported or missing.  Subjective:   Cynthia Barton is a 76 y.o. female who presents for Medicare Annual (Subsequent) preventive examination.  Review of Systems     Cardiac Risk Factors include: advanced age (>51mn, >>57women);hypertension;obesity (BMI >30kg/m2)     Objective:    Today's Vitals   07/26/21 0837  Weight: 191 lb (86.6 kg)  Height: '4\' 11"'  (1.499 m)   Body mass index is 38.58 kg/m.  Advanced Directives 07/26/2021 07/20/2020 07/14/2019 07/09/2018 04/02/2017 10/22/2016 04/24/2016  Does Patient Have a Medical Advance Directive? Yes Yes No No No No No  Type of [place]amed[place] will - - - - -  Copy of HCarri[place]in Chart? No - copy requested No - copy requested - - - - -  Would patient like information on creating a medical advance directive? - - Yes (MAU/Ambulatory/Procedural Areas - Information given) No - Patient declined - - -    Current Medications (verified) Outpatient Encounter  Medications as of 07/26/2021  Medication Sig   anastrozole (ARIMIDEX) 1 MG tablet Take 1 tablet by mouth once daily   Ascorbic Acid (VITAMIN C) 1000 MG tablet Take 1 tablet (1,000 mg total) by mouth daily.   Black Elderberry,Berry-Flower, 575 MG CAPS Take 1 capsule by mouth daily.   Calcium Carbonate-Vitamin D 600-400 MG-UNIT tablet Take 1 tablet by mouth every other day. 12089m  Cholecalciferol (VITAMIN D3) 125 MCG (5000 UT) CAPS Take by mouth.   hydrochlorothiazide (MICROZIDE) 12.5 MG capsule Take 1 capsule by mouth once daily   lisinopril (ZESTRIL) 10 MG tablet Take 1 tablet by mouth once daily   Multiple Vitamins-Minerals (MULTIVITAMIN WITH MINERALS) tablet Take 1 tablet by mouth daily.   Pitavastatin Calcium (LIVALO) 2 MG TABS Take 1 tablet (2 mg total) by mouth 3 (three) times a week. Takes on Monday Wednesday and Friday   No facility-administered encounter medications on file as of 07/26/2021.    Allergies (verified) Patient has no known allergies.   History: Past Medical History:  Diagnosis Date   Bilateral carpal tunnel syndrome 02/23/2020   [place]   ER+/PR+/Her2- right breast   [place] cancer of lower-outer quadrant of right female breast [place]   Hyperlipidemia    Hypertension    Radiation 11/10/14-12/09/14   Right Breast   Uterine prolapse 2016   Wears dentures    top   Wears glasses    Past Surgical History:  Procedure Laterality Date   BREAST SURGERY  1980   rt br bx-no anesth   COLONOSCOPY  07/27/12   mild diverticulosis   lumpectomy  right breast Right 09/30/14   TUBAL LIGATION     Family History  Problem Relation Age of Onset   Heart disease Father    Melanoma Mother    Kidney failure Sister 39   Heart disease Maternal Uncle    Social History   Socioeconomic History   Marital status: Divorced    Spouse name: Not on file   Number of children: Not on file   Years of education: Not on file   Highest education level: Not on file   Occupational History   Occupation: retired  Tobacco Use   Smoking status: Never   Smokeless tobacco: Never  Vaping Use   Vaping Use: Never used  Substance and Sexual Activity   Alcohol use: Not Currently    Alcohol/week: 0.0 standard drinks   Drug use: No   Sexual activity: Not Currently    Comment: 1st intercourse 76 yo-Fewer than 5 partners  Other Topics Concern   Not on file  Social History Narrative   Not on file   Social Determinants of Health   Financial Resource Strain: Low Risk    Difficulty of Paying Living Expenses: Not hard at all  Food Insecurity: No Food Insecurity   Worried About Charity fundraiser in the Last Year: Never true   [place] in the Last Year: Never true  Transportation Needs: No Transportation Needs   Lack of Transportation (Medical): No   Lack of Transportation (Non-Medical): No  Physical Activity: Inactive   Days of Exercise per Week: 0 days   Minutes of Exercise per Session: 0 min  Stress: No Stress Concern Present   Feeling of Stress : Not at all  Social Connections: Not on file    Tobacco Counseling Counseling given: Not Answered   Clinical Intake:  Pre-visit preparation completed: Yes  Pain : No/denies pain     Nutritional Status: BMI > 30  Obese Nutritional Risks: None Diabetes: No  How often do you need to have someone help you when you read instructions, pamphlets, or other written materials from your doctor or pharmacy?: 1 - Never What is the last grade level you completed in school?: college  Diabetic? no  Interpreter Needed?: No  Information entered by :: NAllen LPN   Activities of Daily Living In your present state of health, do you have any difficulty performing the following activities: 07/26/2021  Hearing? N  Comment has hearing aide  Vision? N  Difficulty concentrating or making decisions? N  Walking or climbing stairs? N  Dressing or bathing? N  Doing errands, shopping? N  Preparing Food and  eating ? N  Using the Toilet? N  In the past six months, have you accidently leaked urine? N  Do you have problems with loss of bowel control? N  Managing your Medications? N  Managing your Finances? N  Housekeeping or managing your Housekeeping? N  Some recent data might be hidden    Patient Care Team: Glendale Chard, MD as PCP - General (Internal Medicine) Jovita Kussmaul, MD as Consulting Physician (General Surgery) Nicholas Lose, MD as Consulting Physician (Hematology and Oncology) Thea Silversmith, MD as Consulting Physician (Radiation Oncology) Rockwell Germany, RN as Registered Nurse Mauro Kaufmann, RN as Registered Nurse Holley Bouche, NP (Inactive) as Nurse Practitioner (Nurse Practitioner) Sylvan Cheese, NP as Nurse Practitioner (Hematology and Oncology) Caudill, Kennieth Francois, [facility] (Inactive) as Pharmacist (Pharmacist)  Indicate any recent Medical Services you may have received from other  than [facility] providers in the past year (date may be approximate).     Assessment:   This is a routine wellness examination for Kelsei.  Hearing/Vision screen Vision Screening - Comments:: Regular eye exams, [facility]  Dietary issues and exercise activities discussed: Current Exercise Habits: The patient does not participate in regular exercise at present   Goals Addressed             This Visit's Progress    Patient Stated       07/26/2021, stay healthy       Depression Screen PHQ 2/9 Scores 07/26/2021 07/20/2020 01/17/2020 07/14/2019 01/12/2019 07/09/2018 04/23/2018  PHQ - 2 Score 0 0 0 0 0 0 0  PHQ- 9 Score - - - 0 - 0 -    Fall Risk Fall Risk  07/26/2021 07/20/2020 07/14/2019 01/12/2019 07/09/2018  Falls in the past year? 0 1 0 0 0  Comment - missed a step - - -  Number falls in past yr: - 0 - - -  Injury with Fall? - 0 - - -  Risk for fall due to : Medication side effect Medication side effect Medication side effect - Medication side effect  Follow up  Falls evaluation completed;Education provided;Falls prevention discussed Falls evaluation completed;Education provided;Falls prevention discussed - - Falls prevention discussed;Education provided    FALL RISK PREVENTION PERTAINING TO THE HOME:  Any stairs in or around the home? Yes  If so, are there any without handrails? No  Home free of loose throw rugs in walkways, pet beds, electrical cords, etc? Yes  Adequate lighting in your home to reduce risk of falls? Yes   ASSISTIVE DEVICES UTILIZED TO PREVENT FALLS:  Life alert? No  Use of a cane, walker or w/c? No  Grab bars in the bathroom? No  Shower chair or bench in shower? No  Elevated toilet seat or a handicapped toilet? Yes   TIMED UP AND GO:  Was the test performed? No .      Cognitive Function:     6CIT Screen 07/26/2021 07/20/2020 07/14/2019 07/09/2018  What Year? 0 points 0 points 0 points 0 points  What month? 0 points 0 points 0 points 0 points  What time? 0 points 0 points 0 points 0 points  Count back from 20 4 points 2 points 0 points 0 points  Months in reverse 4 points 2 points 0 points 0 points  Repeat phrase 6 points 6 points 2 points 2 points  Total Score '14 10 2 2    ' Immunizations Immunization History  Administered Date(s) Administered   Fluad Quad(high Dose 65+) 07/20/2020   Influenza, High Dose Seasonal PF 04/23/2018, 03/22/2019   PFIZER Comirnaty(Gray Top)Covid-19 Tri-Sucrose Vaccine 10/24/2020   PFIZER(Purple Top)SARS-COV-2 Vaccination 07/30/2019, 08/20/2019, 05/31/2020   Zoster Recombinat (Shingrix) 02/15/2019, 04/16/2019    TDAP status: Up to date  Flu Vaccine status: Up to date  Pneumococcal vaccine status: Due, Education has been provided regarding the importance of this vaccine. Advised may receive this vaccine at local pharmacy or Health Dept. Aware to provide a copy of the vaccination record if obtained from local pharmacy or Health Dept. Verbalized acceptance and understanding.  Covid-19  vaccine status: Completed vaccines  Qualifies for Shingles Vaccine? Yes   Zostavax completed No   Shingrix Completed?: Yes  Screening Tests Health Maintenance  Topic Date Due   Pneumonia Vaccine 22+ Years old (1 - PCV) Never done   COVID-19 Vaccine (5 - Booster for [place] series) 12/19/2020  INFLUENZA VACCINE  01/22/2021   COLONOSCOPY (Pts 45-65yr Insurance coverage will need to be confirmed)  07/27/2022   TETANUS/TDAP  02/01/2024   DEXA SCAN  Completed   Hepatitis C Screening  Completed   Zoster Vaccines- Shingrix  Completed   HPV VACCINES  Aged Out    Health Maintenance  Health Maintenance Due  Topic Date Due   Pneumonia Vaccine 76 Years old (1 - PCV) Never done   COVID-19 Vaccine (5 - Booster for Pfizer series) 12/19/2020   INFLUENZA VACCINE  01/22/2021    Colorectal cancer screening: Type of screening: Colonoscopy. Completed 07/27/2012. Repeat every 10 years  Mammogram status: Completed 11/17/2020. Repeat every year  Bone Density status: Completed 04/07/2019.  Lung Cancer Screening: (Low Dose CT Chest recommended if Age 76-80years, 30 pack-year currently smoking OR have quit w/in 15years.) does not qualify.   Lung Cancer Screening Referral: no  Additional Screening:  Hepatitis C Screening: does qualify; Completed 06/15/2012  Vision Screening: Recommended annual ophthalmology exams for early detection of glaucoma and other disorders of the eye. Is the patient up to date with their annual eye exam?  Yes  Who is the provider or what is the name of the office in which the patient attends annual eye exams? [facility] If pt is not established with a provider, would they like to be referred to a provider to establish care? No .   Dental Screening: Recommended annual dental exams for proper oral hygiene  Community Resource Referral / Chronic Care Management: CRR required this visit?  No   CCM required this visit?  No      Plan:     I have personally  reviewed and noted the following in the patients chart:   Medical and social history Use of alcohol, tobacco or illicit drugs  Current medications and supplements including opioid prescriptions.  Functional ability and status Nutritional status Physical activity Advanced directives List of other physicians Hospitalizations, surgeries, and ER visits in previous 12 months Vitals Screenings to include cognitive, depression, and falls Referrals and appointments  In addition, I have reviewed and discussed with patient certain preventive protocols, quality metrics, and best practice recommendations. A written personalized care plan for preventive services as well as general preventive health recommendations were provided to patient.     NKellie Simmering LPN   29/12/2818  Nurse Notes: none  Due to this being a virtual visit, the after visit summary with patients personalized plan was offered to patient via mail or my-chart.  per request, patient was mailed a copy of AVS.

## 2021-07-26 NOTE — Patient Instructions (Signed)
Cynthia Barton , Thank you for taking time to come for your Medicare Wellness Visit. I appreciate your ongoing commitment to your health goals. Please review the following plan we discussed and let me know if I can assist you in the future.   Screening recommendations/referrals: Colonoscopy: completed 07/27/2012, due 07/27/2022 Mammogram: completed 11/17/2020, due 11/17/2021 Bone Density: completed 04/07/2019 Recommended yearly ophthalmology/optometry visit for glaucoma screening and checkup Recommended yearly dental visit for hygiene and checkup  Vaccinations: Influenza vaccine: completed per patient Pneumococcal vaccine: due now Tdap vaccine: completed 01/31/2014, due 02/01/2024 Shingles vaccine: completed   Covid-19: 11/13/2020, 05/31/2020, 08/20/2019, 07/30/2019  Advanced directives: Please bring a copy of your POA (Power of Attorney) and/or Living Will to your next appointment.   Conditions/risks identified: none  Next appointment: Follow up in one year for your annual wellness visit    Preventive Care 65 Years and Older, Female Preventive care refers to lifestyle choices and visits with your health care provider that can promote health and wellness. What does preventive care include? A yearly physical exam. This is also called an annual well check. Dental exams once or twice a year. Routine eye exams. Ask your health care provider how often you should have your eyes checked. Personal lifestyle choices, including: Daily care of your teeth and gums. Regular physical activity. Eating a healthy diet. Avoiding tobacco and drug use. Limiting alcohol use. Practicing safe sex. Taking low-dose aspirin every day. Taking vitamin and mineral supplements as recommended by your health care provider. What happens during an annual well check? The services and screenings done by your health care provider during your annual well check will depend on your age, overall health, lifestyle risk factors, and  family history of disease. Counseling  Your health care provider may ask you questions about your: Alcohol use. Tobacco use. Drug use. Emotional well-being. Home and relationship well-being. Sexual activity. Eating habits. History of falls. Memory and ability to understand (cognition). Work and work Statistician. Reproductive health. Screening  You may have the following tests or measurements: Height, weight, and BMI. Blood pressure. Lipid and cholesterol levels. These may be checked every 5 years, or more frequently if you are over 30 years old. Skin check. Lung cancer screening. You may have this screening every year starting at age 75 if you have a 30-pack-year history of smoking and currently smoke or have quit within the past 15 years. Fecal occult blood test (FOBT) of the stool. You may have this test every year starting at age 60. Flexible sigmoidoscopy or colonoscopy. You may have a sigmoidoscopy every 5 years or a colonoscopy every 10 years starting at age 50. Hepatitis C blood test. Hepatitis B blood test. Sexually transmitted disease (STD) testing. Diabetes screening. This is done by checking your blood sugar (glucose) after you have not eaten for a while (fasting). You may have this done every 1-3 years. Bone density scan. This is done to screen for osteoporosis. You may have this done starting at age 76. Mammogram. This may be done every 1-2 years. Talk to your health care provider about how often you should have regular mammograms. Talk with your health care provider about your test results, treatment options, and if necessary, the need for more tests. Vaccines  Your health care provider may recommend certain vaccines, such as: Influenza vaccine. This is recommended every year. Tetanus, diphtheria, and acellular pertussis (Tdap, Td) vaccine. You may need a Td booster every 10 years. Zoster vaccine. You may need this after age 11. Pneumococcal  13-valent conjugate  (PCV13) vaccine. One dose is recommended after age 78. Pneumococcal polysaccharide (PPSV23) vaccine. One dose is recommended after age 58. Talk to your health care provider about which screenings and vaccines you need and how often you need them. This information is not intended to replace advice given to you by your health care provider. Make sure you discuss any questions you have with your health care provider. Document Released: 07/07/2015 Document Revised: 02/28/2016 Document Reviewed: 04/11/2015 Elsevier Interactive Patient Education  2017 [place] Prevention in the Home Falls can cause injuries. They can happen to people of all ages. There are many things you can do to make your home safe and to help prevent falls. What can I do on the outside of my home? Regularly fix the edges of walkways and driveways and fix any cracks. Remove anything that might make you trip as you walk through a door, such as a raised step or threshold. Trim any bushes or trees on the path to your home. Use bright outdoor lighting. Clear any walking paths of anything that might make someone trip, such as rocks or tools. Regularly check to see if handrails are loose or broken. Make sure that both sides of any steps have handrails. Any raised decks and porches should have guardrails on the edges. Have any leaves, snow, or ice cleared regularly. Use sand or salt on walking paths during winter. Clean up any spills in your garage right away. This includes oil or grease spills. What can I do in the bathroom? Use night lights. Install grab bars by the toilet and in the tub and shower. Do not use towel bars as grab bars. Use non-skid mats or decals in the tub or shower. If you need to sit down in the shower, use a plastic, non-slip stool. Keep the floor dry. Clean up any water that spills on the floor as soon as it happens. Remove soap buildup in the tub or shower regularly. Attach bath mats securely with  double-sided non-slip rug tape. Do not have throw rugs and other things on the floor that can make you trip. What can I do in the bedroom? Use night lights. Make sure that you have a light by your bed that is easy to reach. Do not use any sheets or blankets that are too big for your bed. They should not hang down onto the floor. Have a firm chair that has side arms. You can use this for support while you get dressed. Do not have throw rugs and other things on the floor that can make you trip. What can I do in the kitchen? Clean up any spills right away. Avoid walking on wet floors. Keep items that you use a lot in easy-to-reach places. If you need to reach something above you, use a strong step stool that has a grab bar. Keep electrical cords out of the way. Do not use floor polish or wax that makes floors slippery. If you must use wax, use non-skid floor wax. Do not have throw rugs and other things on the floor that can make you trip. What can I do with my stairs? Do not leave any items on the stairs. Make sure that there are handrails on both sides of the stairs and use them. Fix handrails that are broken or loose. Make sure that handrails are as long as the stairways. Check any carpeting to make sure that it is firmly attached to the stairs. Fix any carpet  that is loose or worn. Avoid having throw rugs at the top or bottom of the stairs. If you do have throw rugs, attach them to the floor with carpet tape. Make sure that you have a light switch at the top of the stairs and the bottom of the stairs. If you do not have them, ask someone to add them for you. What else can I do to help prevent falls? Wear shoes that: Do not have high heels. Have rubber bottoms. Are comfortable and fit you well. Are closed at the toe. Do not wear sandals. If you use a stepladder: Make sure that it is fully opened. Do not climb a closed stepladder. Make sure that both sides of the stepladder are locked  into place. Ask someone to hold it for you, if possible. Clearly mark and make sure that you can see: Any grab bars or handrails. First and last steps. Where the edge of each step is. Use tools that help you move around (mobility aids) if they are needed. These include: Canes. Walkers. Scooters. Crutches. Turn on the lights when you go into a dark area. Replace any light bulbs as soon as they burn out. Set up your furniture so you have a clear path. Avoid moving your furniture around. If any of your floors are uneven, fix them. If there are any pets around you, be aware of where they are. Review your medicines with your doctor. Some medicines can make you feel dizzy. This can increase your chance of falling. Ask your doctor what other things that you can do to help prevent falls. This information is not intended to replace advice given to you by your health care provider. Make sure you discuss any questions you have with your health care provider. Document Released: 04/06/2009 Document Revised: 11/16/2015 Document Reviewed: 07/15/2014 Elsevier Interactive Patient Education  2017 [employer].

## 2021-07-31 ENCOUNTER — Other Ambulatory Visit: Payer: Self-pay

## 2021-07-31 MED ORDER — LIVALO 2 MG PO TABS: 2.0000 mg | ORAL_TABLET | ORAL | 1 refills | Status: DC

## 2021-08-09 DIAGNOSIS — R32 Unspecified urinary incontinence: Secondary | ICD-10-CM | POA: Diagnosis not present

## 2021-08-09 DIAGNOSIS — R45851 Suicidal ideations: Secondary | ICD-10-CM | POA: Diagnosis not present

## 2021-08-09 DIAGNOSIS — C50919 Malignant neoplasm of unspecified site of unspecified female breast: Secondary | ICD-10-CM | POA: Diagnosis not present

## 2021-08-09 DIAGNOSIS — Z6839 Body mass index (BMI) 39.0-39.9, adult: Secondary | ICD-10-CM | POA: Diagnosis not present

## 2021-08-09 DIAGNOSIS — M8580 Other specified disorders of bone density and structure, unspecified site: Secondary | ICD-10-CM | POA: Diagnosis not present

## 2021-08-09 DIAGNOSIS — E785 Hyperlipidemia, unspecified: Secondary | ICD-10-CM | POA: Diagnosis not present

## 2021-08-09 DIAGNOSIS — I10 Essential (primary) hypertension: Secondary | ICD-10-CM | POA: Diagnosis not present

## 2021-08-09 DIAGNOSIS — Z7722 Contact with and (suspected) exposure to environmental tobacco smoke (acute) (chronic): Secondary | ICD-10-CM | POA: Diagnosis not present

## 2021-08-11 ENCOUNTER — Other Ambulatory Visit: Payer: Self-pay

## 2021-08-11 ENCOUNTER — Encounter (HOSPITAL_BASED_OUTPATIENT_CLINIC_OR_DEPARTMENT_OTHER): Payer: Self-pay | Admitting: Emergency Medicine

## 2021-08-11 ENCOUNTER — Emergency Department (HOSPITAL_BASED_OUTPATIENT_CLINIC_OR_DEPARTMENT_OTHER): Payer: Medicare PPO

## 2021-08-11 ENCOUNTER — Emergency Department (HOSPITAL_BASED_OUTPATIENT_CLINIC_OR_DEPARTMENT_OTHER)
Admission: EM | Admit: 2021-08-11 | Discharge: 2021-08-11 | Disposition: A | Discharge status: Home / Self Care | Payer: Medicare PPO | Attending: Emergency Medicine | Admitting: Emergency Medicine

## 2021-08-11 DIAGNOSIS — M19071 Primary osteoarthritis, right ankle and foot: Secondary | ICD-10-CM | POA: Diagnosis not present

## 2021-08-11 DIAGNOSIS — M1712 Unilateral primary osteoarthritis, left knee: Secondary | ICD-10-CM | POA: Diagnosis not present

## 2021-08-11 DIAGNOSIS — Z853 Personal history of malignant neoplasm of breast: Secondary | ICD-10-CM | POA: Diagnosis not present

## 2021-08-11 DIAGNOSIS — S8992XA Unspecified injury of left lower leg, initial encounter: Secondary | ICD-10-CM | POA: Diagnosis not present

## 2021-08-11 DIAGNOSIS — S99921A Unspecified injury of right foot, initial encounter: Secondary | ICD-10-CM | POA: Insufficient documentation

## 2021-08-11 DIAGNOSIS — I10 Essential (primary) hypertension: Secondary | ICD-10-CM | POA: Insufficient documentation

## 2021-08-11 DIAGNOSIS — S199XXA Unspecified injury of neck, initial encounter: Secondary | ICD-10-CM | POA: Diagnosis not present

## 2021-08-11 DIAGNOSIS — Z041 Encounter for examination and observation following transport accident: Secondary | ICD-10-CM | POA: Diagnosis not present

## 2021-08-11 DIAGNOSIS — Y92410 Unspecified street and highway as the place of occurrence of the external cause: Secondary | ICD-10-CM | POA: Diagnosis not present

## 2021-08-11 DIAGNOSIS — M25562 Pain in left knee: Secondary | ICD-10-CM | POA: Diagnosis not present
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
## 2021-08-11 NOTE — Discharge Instructions (Signed)
You were evaluated in the Emergency Department and after careful evaluation, we did not find any emergent condition requiring admission or further testing in the hospital.  Your exam/testing today was overall reassuring.  X-rays did not show any broken bones or emergencies.  Recommend Tylenol or Motrin for any lingering soreness.  Please return to the Emergency Department if you experience any worsening of your condition.  Thank you for allowing [place] to be a part of your care.

## 2021-08-11 NOTE — ED Notes (Signed)
Pt states that her neck is now starting to get a little stiff.
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
## 2021-08-13 ENCOUNTER — Telehealth: Payer: Self-pay

## 2021-08-13 NOTE — Telephone Encounter (Signed)
Transition Care Management Follow-up Telephone Call Date of discharge and from where: 08/11/[address]  How have you been since you were released from the hospital? Pt states her knees hurt a little & her big toe. Any questions or concerns? No  Items Reviewed: Did the pt receive and understand the discharge instructions provided? Yes  Medications obtained and verified? Yes  Other? Yes  Any new allergies since your discharge? No  Dietary orders reviewed? Yes Do you have support at home? yes  Home Care and Equipment/Supplies: Were home health services ordered? not applicable If so, what is the name of the agency? N/a  Has the agency set up a time to come to the patient's home? not applicable Were any new equipment or medical supplies ordered?  No What is the name of the medical supply agency? N/a Were you able to get the supplies/equipment? not applicable Do you have any questions related to the use of the equipment or supplies? No  Functional Questionnaire: (I = Independent and D = Dependent) ADLs: i  Bathing/Dressing- i  Meal Prep- i  Eating- i  Maintaining continence- i  Transferring/Ambulation- i  Managing Meds- i  Follow up appointments reviewed:  PCP Hospital f/u appt confirmed? No  Scheduled to see n/a on n/a @ n/a. [facility] f/u appt confirmed? No  Scheduled to see n/a on n/a @ n/a. Are transportation arrangements needed? No  If their condition worsens, is the pt aware to call PCP or go to the Emergency Dept.? Yes Was the patient provided with contact information for the PCP's office or ED? Yes Was to pt encouraged to call back with questions or concerns? Yes

## 2021-08-23 ENCOUNTER — Telehealth: Payer: Self-pay

## 2021-08-23 NOTE — Chronic Care Management (AMB) (Signed)
? ? ?  Chronic Care Management ?Pharmacy Assistant  ? ?Name: Cynthia Barton  MRN: [id number] DOB: 1946/04/01 ? ?Reason for Encounter: Reschedule appointment ?  ?Medications: ?Outpatient Encounter Medications as of 08/23/2021  ?Medication Sig  ? anastrozole (ARIMIDEX) 1 MG tablet Take 1 tablet by mouth once daily  ? Ascorbic Acid (VITAMIN C) 1000 MG tablet Take 1 tablet (1,000 mg total) by mouth daily.  ? Black Elderberry,Berry-Flower, 575 MG CAPS Take 1 capsule by mouth daily.  ? Calcium Carbonate-Vitamin D 600-400 MG-UNIT tablet Take 1 tablet by mouth every other day. 1200mg   ? Cholecalciferol (VITAMIN D3) 125 MCG (5000 UT) CAPS Take by mouth.  ? hydrochlorothiazide (MICROZIDE) 12.5 MG capsule Take 1 capsule by mouth once daily  ? lisinopril (ZESTRIL) 10 MG tablet Take 1 tablet by mouth once daily  ? Multiple Vitamins-Minerals (MULTIVITAMIN WITH MINERALS) tablet Take 1 tablet by mouth daily.  ? Pitavastatin Calcium (LIVALO) 2 MG TABS Take 1 tablet (2 mg total) by mouth 3 (three) times a week. Takes on Monday Wednesday and Friday  ? ?No facility-administered encounter medications on file as of 08/23/2021.  ? ?08-23-2021: Left patient voicemail stating 08-24-2021 appointment with Orlando Penner CPP can be pushed out to June since patient has appointment with PCP on 08-28-2021. Scheduled appointment for 12-07-2021 and informed patient to call back if appointment doesn't work for her. ? ?Jeannette How CMA ?Clinical Pharmacist Assistant ?[phone] ? ?

## 2021-08-24 ENCOUNTER — Other Ambulatory Visit: Payer: Self-pay | Admitting: Internal Medicine

## 2021-08-24 ENCOUNTER — Telehealth: Payer: Medicare PPO

## 2021-08-26 ENCOUNTER — Other Ambulatory Visit: Payer: Self-pay | Admitting: Internal Medicine

## 2021-08-28 ENCOUNTER — Ambulatory Visit (INDEPENDENT_AMBULATORY_CARE_PROVIDER_SITE_OTHER): Payer: Medicare PPO | Admitting: Internal Medicine

## 2021-08-28 ENCOUNTER — Other Ambulatory Visit: Payer: Self-pay

## 2021-08-28 ENCOUNTER — Encounter: Payer: Self-pay | Admitting: Internal Medicine

## 2021-08-28 DIAGNOSIS — M25562 Pain in left knee: Secondary | ICD-10-CM

## 2021-08-28 DIAGNOSIS — M25561 Pain in right knee: Secondary | ICD-10-CM

## 2021-08-28 DIAGNOSIS — Z6838 Body mass index (BMI) 38.0-38.9, adult: Secondary | ICD-10-CM

## 2021-08-28 DIAGNOSIS — M79674 Pain in right toe(s): Secondary | ICD-10-CM | POA: Insufficient documentation

## 2021-08-28 MED ORDER — DICLOFENAC SODIUM 1 % EX GEL: 4.0000 g | Freq: Four times a day (QID) | CUTANEOUS | 0 refills | Status: DC

## 2021-08-28 NOTE — Patient Instructions (Signed)
Acute Knee Pain, Adult Acute knee pain is sudden and may be caused by damage, swelling, or irritation of the muscles and tissues that support the knee. Pain may result from: A fall. An injury to the knee from twisting motions. A hit to the knee. Infection. Acute knee pain may go away on its own with time and rest. If it does not, your health care provider may order tests to find the cause of the pain. These may include: Imaging tests, such as an X-ray, MRI, CT scan, or ultrasound. Joint aspiration. In this test, fluid is removed from the knee and evaluated. Arthroscopy. In this test, a lighted tube is inserted into the knee and an image is projected onto a TV screen. Biopsy. In this test, a sample of tissue is removed from the body and studied under a microscope. Follow these instructions at home: If you have a knee sleeve or brace:  Wear the knee sleeve or brace as told by your health care provider. Remove it only as told by your health care provider. Loosen it if your toes tingle, become numb, or turn cold and blue. Keep it clean. If the knee sleeve or brace is not waterproof: Do not let it get wet. Cover it with a watertight covering when you take a bath or shower.  Activity Rest your knee. Do not do things that cause pain or make pain worse. Avoid high-impact activities or exercises, such as running, jumping rope, or doing jumping jacks. Work with a physical therapist to make a safe exercise program, as recommended by your health care provider. Do exercises as told by your physical therapist. Managing pain, stiffness, and swelling  If directed, put ice on the affected knee. To do this: If you have a removable knee sleeve or brace, remove it as told by your health care provider. Put ice in a plastic bag. Place a towel between your skin and the bag. Leave the ice on for 20 minutes, 2-3 times a day. Remove the ice if your skin turns bright red. This is very important. If you cannot  feel pain, heat, or cold, you have a greater risk of damage to the area. If directed, use an elastic bandage to put pressure (compression) on your injured knee. This may control swelling, give support, and help with discomfort. Raise (elevate) your knee above the level of your heart while you are sitting or lying down. Sleep with a pillow under your knee.  General instructions Take over-the-counter and prescription medicines only as told by your health care provider. Do not use any products that contain nicotine or tobacco, such as cigarettes, e-cigarettes, and chewing tobacco. If you need help quitting, ask your health care provider. If you are overweight, work with your health care provider and a dietitian to set a weight-loss goal that is healthy and reasonable for you. Extra weight can put pressure on your knee. Pay attention to any changes in your symptoms. Keep all follow-up visits. This is important. Contact a health care provider if: Your knee pain continues, changes, or gets worse. You have a fever along with knee pain. Your knee feels warm to the touch or is red. Your knee buckles or locks up. Get help right away if: Your knee swells, and the swelling becomes worse. You cannot move your knee. You have severe pain in your knee that cannot be managed with pain medicine. Summary Acute knee pain can be caused by a fall, an injury, an infection, or damage, swelling,   or irritation of the tissues that support your knee. Your health care provider may perform tests to find out the cause of the pain. Pay attention to any changes in your symptoms. Relieve your pain with rest, medicines, light activity, and the use of ice. Get help right away if your knee swells, you cannot move your knee, or you have severe pain that cannot be managed with medicine. This information is not intended to replace advice given to you by your health care provider. Make sure you discuss any questions you have with  your healthcare provider. Document Revised: 11/24/2019 Document Reviewed: 11/24/2019 Elsevier Patient Education  2022 Elsevier Inc.  
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
## 2021-09-06 ENCOUNTER — Ambulatory Visit: Payer: Medicare PPO | Admitting: Orthopedic Surgery

## 2021-09-06 ENCOUNTER — Ambulatory Visit (INDEPENDENT_AMBULATORY_CARE_PROVIDER_SITE_OTHER): Payer: Medicare PPO

## 2021-09-06 ENCOUNTER — Other Ambulatory Visit: Payer: Self-pay

## 2021-09-06 DIAGNOSIS — M79674 Pain in right toe(s): Secondary | ICD-10-CM | POA: Diagnosis not present

## 2021-09-06 DIAGNOSIS — M25562 Pain in left knee: Secondary | ICD-10-CM

## 2021-09-09 ENCOUNTER — Encounter: Payer: Self-pay | Admitting: Orthopedic Surgery
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
## 2021-09-12 ENCOUNTER — Telehealth: Payer: Self-pay | Admitting: Hematology and Oncology

## 2021-09-12 NOTE — Telephone Encounter (Signed)
Rescheduled appointment per providers. Patient aware.  ? ?

## 2021-09-24 ENCOUNTER — Telehealth: Payer: Self-pay
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
## 2021-10-03 ENCOUNTER — Ambulatory Visit: Payer: Medicare PPO | Admitting: Hematology and Oncology

## 2021-10-10 NOTE — Chronic Care Management (AMB) (Signed)
10-10-2021: Patient called following up on Livalo refills. Reminded patient that refills were requested for the rest oft the year. ? ?Jeannette How CMA ?Clinical Pharmacist Assistant ?[phone] ? ?

## 2021-10-16 ENCOUNTER — Other Ambulatory Visit: Payer: Self-pay

## 2021-10-16 ENCOUNTER — Inpatient Hospital Stay: Payer: Medicare PPO | Attending: Hematology and Oncology | Admitting: Hematology and Oncology

## 2021-10-16 DIAGNOSIS — Z923 Personal history of irradiation: Secondary | ICD-10-CM | POA: Insufficient documentation

## 2021-10-16 DIAGNOSIS — Z79811 Long term (current) use of aromatase inhibitors: Secondary | ICD-10-CM | POA: Diagnosis not present

## 2021-10-16 DIAGNOSIS — Z79899 Other long term (current) drug therapy: Secondary | ICD-10-CM | POA: Insufficient documentation

## 2021-10-16 DIAGNOSIS — C50511 Malignant neoplasm of lower-outer quadrant of right female breast: Secondary | ICD-10-CM | POA: Diagnosis not present

## 2021-10-16 DIAGNOSIS — Z170 Estrogen receptor positive status [ER+]: Secondary | ICD-10-CM | POA: Insufficient documentation

## 2021-10-16 NOTE — Assessment & Plan Note (Signed)
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 29%, HER-2 negative ratio 1, Ki-67 21%, T1 cN0 M0 stage IA Oncotype Dx 17 (Low Risk) S/P XRT ?? ?Current treatment: Antiestrogen therapy once daily for 7 years started 12/23/2014?-December 21, 2021 ? After June 2023, she will discontinue antiestrogen treatment. ?? ?Anastrozole toxicities: ?1. Occasional hot flashes ?2. Muscle aches and pains ?? ?Breast cancer surveillance:  ?1. Breast exam?10/16/2021: No palpable nodules or concerns ?2. Mammogram?08/28/2021?at [facility]: Postsurgical changes breast density category B ?3. Bone density?04/07/2019: T score?-1.9??osteopenia: Continue with calcium and vitamin D and weightbearing exercises ? ?Return to clinic on an as-needed basis ?

## 2021-10-22 ENCOUNTER — Telehealth: Payer: Self-pay
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
## 2021-11-08 ENCOUNTER — Telehealth: Payer: Self-pay

## 2021-11-08 NOTE — Chronic Care Management (AMB) (Signed)
11-08-2021: Left patient a voicemail to reschedule telephone appointment with Orlando Penner on 12-07-2021. Appointment canceled.  [place] Pharmacist Assistant [phone]

## 2021-12-03 ENCOUNTER — Other Ambulatory Visit: Payer: Self-pay | Admitting: Hematology and Oncology

## 2021-12-03 DIAGNOSIS — C50511 Malignant neoplasm of lower-outer quadrant of right female breast: Secondary | ICD-10-CM

## 2021-12-06 DIAGNOSIS — H25813 Combined forms of age-related cataract, bilateral: Secondary | ICD-10-CM | POA: Diagnosis not present

## 2021-12-06 DIAGNOSIS — H40013 Open angle with borderline findings, low risk, bilateral: Secondary | ICD-10-CM | POA: Diagnosis not present

## 2021-12-07 ENCOUNTER — Telehealth: Payer: Medicare PPO

## 2021-12-21 DIAGNOSIS — Z0001 Encounter for general adult medical examination with abnormal findings: Secondary | ICD-10-CM | POA: Diagnosis not present

## 2021-12-21 DIAGNOSIS — Z0000 Encounter for general adult medical examination without abnormal findings: Secondary | ICD-10-CM | POA: Diagnosis not present

## 2021-12-21 DIAGNOSIS — C50511 Malignant neoplasm of lower-outer quadrant of right female breast: Secondary | ICD-10-CM | POA: Diagnosis not present

## 2021-12-21 DIAGNOSIS — E785 Hyperlipidemia, unspecified: Secondary | ICD-10-CM | POA: Diagnosis not present

## 2021-12-21 DIAGNOSIS — I129 Hypertensive chronic kidney disease with stage 1 through stage 4 chronic kidney disease, or unspecified chronic kidney disease: Secondary | ICD-10-CM | POA: Diagnosis not present

## 2021-12-21 DIAGNOSIS — Z170 Estrogen receptor positive status [ER+]: Secondary | ICD-10-CM | POA: Diagnosis not present

## 2022-01-22 DIAGNOSIS — Z1231 Encounter for screening mammogram for malignant neoplasm of breast: Secondary | ICD-10-CM | POA: Diagnosis not present

## 2022-01-22 LAB — HM MAMMOGRAPHY: HM Mammogram: ABNORMAL — AB (ref 0–4)

## 2022-01-28 ENCOUNTER — Telehealth: Payer: Self-pay

## 2022-01-28 NOTE — Chronic Care Management (AMB) (Signed)
Care Gap(s) Not Met that Need to be Addressed:   Controlling High Blood Pressure   Action Taken: Last BP 10-16-2021 155/92. Contacted patient's daughter and she will call back with home readings. 01-28-2022 BP reading 116/81 80.    Follow Up: None needed  [place] Clinical Pharmacist Assistant [phone]

## 2022-02-11 ENCOUNTER — Ambulatory Visit (INDEPENDENT_AMBULATORY_CARE_PROVIDER_SITE_OTHER): Payer: Medicare PPO | Admitting: Internal Medicine

## 2022-02-11 ENCOUNTER — Encounter: Payer: Self-pay | Admitting: Internal Medicine

## 2022-02-11 ENCOUNTER — Other Ambulatory Visit (HOSPITAL_COMMUNITY)
Admission: RE | Admit: 2022-02-11 | Discharge: 2022-02-11 | Disposition: A | Discharge status: Home / Self Care | Payer: Medicare PPO | Source: Ambulatory Visit | Attending: Internal Medicine | Admitting: Internal Medicine

## 2022-02-11 ENCOUNTER — Other Ambulatory Visit: Payer: Self-pay

## 2022-02-11 VITALS — BP 138/74 | HR 87 | Temp 98.0°F | Ht 59.0 in | Wt 187.2 lb

## 2022-02-11 DIAGNOSIS — R7309 Other abnormal glucose: Secondary | ICD-10-CM | POA: Diagnosis not present

## 2022-02-11 DIAGNOSIS — M25561 Pain in right knee: Secondary | ICD-10-CM

## 2022-02-11 DIAGNOSIS — Z6838 Body mass index (BMI) 38.0-38.9, adult: Secondary | ICD-10-CM

## 2022-02-11 DIAGNOSIS — M25562 Pain in left knee: Secondary | ICD-10-CM

## 2022-02-11 DIAGNOSIS — Z01419 Encounter for gynecological examination (general) (routine) without abnormal findings: Secondary | ICD-10-CM | POA: Diagnosis not present

## 2022-02-11 DIAGNOSIS — M25511 Pain in right shoulder: Secondary | ICD-10-CM | POA: Diagnosis not present

## 2022-02-11 DIAGNOSIS — C50511 Malignant neoplasm of lower-outer quadrant of right female breast: Secondary | ICD-10-CM | POA: Diagnosis not present

## 2022-02-11 DIAGNOSIS — G8929 Other chronic pain: Secondary | ICD-10-CM | POA: Diagnosis not present

## 2022-02-11 DIAGNOSIS — N182 Chronic kidney disease, stage 2 (mild): Secondary | ICD-10-CM | POA: Diagnosis not present

## 2022-02-11 DIAGNOSIS — I129 Hypertensive chronic kidney disease with stage 1 through stage 4 chronic kidney disease, or unspecified chronic kidney disease: Secondary | ICD-10-CM | POA: Diagnosis not present

## 2022-02-11 DIAGNOSIS — Z23 Encounter for immunization: Secondary | ICD-10-CM | POA: Diagnosis not present

## 2022-02-11 DIAGNOSIS — Z0000 Encounter for general adult medical examination without abnormal findings: Secondary | ICD-10-CM

## 2022-02-11 DIAGNOSIS — Z1151 Encounter for screening for human papillomavirus (HPV): Secondary | ICD-10-CM | POA: Insufficient documentation

## 2022-02-11 DIAGNOSIS — Z170 Estrogen receptor positive status [ER+]: Secondary | ICD-10-CM

## 2022-02-11 LAB — POCT URINALYSIS DIPSTICK
Bilirubin, UA: NEGATIVE
Blood, UA: NEGATIVE
Glucose, UA: NEGATIVE
Ketones, UA: NEGATIVE
Nitrite, UA: NEGATIVE
Protein, UA: NEGATIVE
Spec Grav, UA: 1.025 (ref 1.010–1.025)
Urobilinogen, UA: 0.2 E.U./dL
pH, UA: 5.5 (ref 5.0–8.0)

## 2022-02-11 LAB — POC HEMOCCULT BLD/STL (OFFICE/1-CARD/DIAGNOSTIC)

## 2022-02-11 NOTE — Progress Notes (Signed)
I,Cynthia Barton,acting as a scribe for Cynthia Greenland, MD.,have documented all relevant documentation on the behalf of Cynthia Greenland, MD,as directed by  Cynthia Greenland, MD while in the presence of Cynthia Greenland, MD.    Subjective:     Patient ID: Cynthia Barton , female    DOB: 1945/12/19 , 76 y.o.   MRN: [id number]   Chief Complaint  Patient presents with   Annual Exam   Hypertension    HPI  She presents today for HM. Patient is here with her daughter. She reports compliance with medications.  Patient would like PAP smear today. She denies headaches, chest pain and shortness of breath.   Hypertension This is a chronic problem. The current episode started more than 1 year ago. The problem has been gradually improving since onset. The problem is controlled. Pertinent negatives include no blurred vision or palpitations. Risk factors for coronary artery disease include dyslipidemia, post-menopausal state, obesity and sedentary lifestyle. The current treatment provides moderate improvement. Compliance problems include exercise.  Hypertensive end-organ damage includes kidney disease.     Past Medical History:  Diagnosis Date   Bilateral carpal tunnel syndrome 02/23/2020   Breast cancer ([place]) 09/07/2014   ER+/PR+/Her2- right breast   Breast cancer of lower-outer quadrant of right female breast ([name]) 09/06/2014   Hyperlipidemia    Hypertension    Radiation 11/10/14-12/09/14   Right Breast   Uterine prolapse 2016   Wears dentures    top   Wears glasses      Family History  Problem Relation Age of Onset   Heart disease Father    Melanoma Mother    Kidney failure Sister 68   Heart disease Maternal Uncle      Current Outpatient Medications:    Ascorbic Acid (VITAMIN C) 1000 MG tablet, Take 1 tablet (1,000 mg total) by mouth daily., Disp: , Rfl:    Black Elderberry,Berry-Flower, 575 MG CAPS, Take 1 capsule by mouth daily., Disp: , Rfl:    Calcium Carbonate-Vitamin D 600-400  MG-UNIT tablet, Take 1 tablet by mouth every other day. 1216m, Disp: , Rfl:    Cholecalciferol (VITAMIN D3) 125 MCG (5000 UT) CAPS, Take by mouth., Disp: , Rfl:    Multiple Vitamins-Minerals (MULTIVITAMIN WITH MINERALS) tablet, Take 1 tablet by mouth daily., Disp: , Rfl:    Pitavastatin Calcium (LIVALO) 2 MG TABS, Take 1 tablet (2 mg total) by mouth 3 (three) times a week. Takes on Monday Wednesday and Friday, Disp: 36 tablet, Rfl: 1   diclofenac Sodium (VOLTAREN) 1 % GEL, Apply 4 g topically 4 (four) times daily. Prn knee pain (Patient not taking: Reported on 02/11/2022), Disp: 350 g, Rfl: 0   hydrochlorothiazide (MICROZIDE) 12.5 MG capsule, Take 1 capsule by mouth once daily, Disp: 90 capsule, Rfl: 0   lisinopril (ZESTRIL) 10 MG tablet, Take 1 tablet by mouth once daily, Disp: 90 tablet, Rfl: 0   No Known Allergies   Review of Systems  Constitutional: Negative.   HENT: Negative.    Eyes: Negative.  Negative for blurred vision.  Respiratory: Negative.    Cardiovascular: Negative.  Negative for palpitations.  Gastrointestinal: Negative.   Endocrine: Negative.   Genitourinary: Negative.   Musculoskeletal:  Positive for arthralgias.       She c/o r shoulder pain and b/l knee pain. Denies fall/trauma. Sx come and go. Admits to waking up with stiffness, usually improves as the day progresses.   Skin: Negative.   Allergic/Immunologic: Negative.  Neurological: Negative.   Hematological: Negative.   Psychiatric/Behavioral: Negative.       Today's Vitals   02/11/22 1415  BP: 138/74  Pulse: 87  Temp: 98 F (36.7 C)  SpO2: 96%  Weight: 187 lb 3.2 oz (84.9 kg)  Height: _0  (1.499 m)  PainSc: 0-No pain   Body mass index is 37.81 kg/m.  Wt Readings from Last 3 Encounters:  02/11/22 187 lb 3.2 oz (84.9 kg)  10/16/21 194 lb (88 kg)  08/28/21 191 lb 12.8 oz (87 kg)    Objective:  Physical Exam Vitals and nursing note reviewed. Exam conducted with a chaperone present.   Constitutional:      Appearance: Normal appearance.  HENT:     Head: Normocephalic and atraumatic.     Right Ear: Tympanic membrane, ear canal and external ear normal.     Left Ear: Tympanic membrane, ear canal and external ear normal.     Nose: Nose normal.     Mouth/Throat:     Mouth: Mucous membranes are moist.     Pharynx: Oropharynx is clear.  Eyes:     Extraocular Movements: Extraocular movements intact.     Conjunctiva/sclera: Conjunctivae normal.     Pupils: Pupils are equal, round, and reactive to light.  Cardiovascular:     Rate and Rhythm: Normal rate and regular rhythm.     Pulses: Normal pulses.     Heart sounds: Normal heart sounds.  Pulmonary:     Effort: Pulmonary effort is normal.     Breath sounds: Normal breath sounds.  Chest:  Breasts:    Tanner Score is 5.     Left: Normal.     Comments: Healed surgical scar on right Abdominal:     General: Abdomen is flat. Bowel sounds are normal.     Palpations: Abdomen is soft.     Hernia: There is no hernia in the left inguinal area or right inguinal area.  Genitourinary:    General: Normal vulva.     Exam position: Lithotomy position.     Tanner stage (genital): 5.     Cervix: Normal.     Rectum: Normal. Guaiac result negative.     Comments: Atrophic vaginal mucosa Musculoskeletal:        General: Normal range of motion.     Cervical back: Normal range of motion and neck supple.  Lymphadenopathy:     Lower Body: No right inguinal adenopathy.  Skin:    General: Skin is warm and dry.  Neurological:     General: No focal deficit present.     Mental Status: She is alert and oriented to person, place, and time.  Psychiatric:        Mood and Affect: Mood normal.        Behavior: Behavior normal.      Assessment And Plan:     1. Encounter for annual health examination Comments: A full exam was performed. Importance of monthly self breast exams was discussed with the patient. PATIENT IS ADVISED TO GET 30-45  MINUTES REGULAR EXERCISE NO LESS THAN FOUR TO FIVE DAYS PER WEEK - BOTH WEIGHTBEARING EXERCISES AND AEROBIC ARE RECOMMENDED.  PATIENT IS ADVISED TO FOLLOW A HEALTHY DIET WITH AT LEAST SIX FRUITS/VEGGIES PER DAY, DECREASE INTAKE OF RED MEAT, AND TO INCREASE FISH INTAKE TO TWO DAYS PER WEEK.  MEATS/FISH SHOULD NOT BE FRIED, BAKED OR BROILED IS PREFERABLE.  IT IS ALSO IMPORTANT TO CUT BACK ON YOUR SUGAR INTAKE. PLEASE AVOID  ANYTHING WITH ADDED SUGAR, CORN SYRUP OR OTHER SWEETENERS. IF YOU MUST USE A SWEETENER, YOU CAN TRY STEVIA. IT IS ALSO IMPORTANT TO AVOID ARTIFICIALLY SWEETENERS AND DIET BEVERAGES. LASTLY, I SUGGEST WEARING SPF 50 SUNSCREEN ON EXPOSED PARTS AND ESPECIALLY WHEN IN THE DIRECT SUNLIGHT FOR AN EXTENDED PERIOD OF TIME.  PLEASE AVOID FAST FOOD RESTAURANTS AND INCREASE YOUR WATER INTAKE.  2. Cervical smear, as part of routine gynecological examination Comments: Pap smear performed. Stool heme negative.  - Cytology -Pap Smear - POC Hemoccult Bld/Stl (1-Cd Office Dx)  3. Benign hypertension with CKD (chronic kidney disease), stage II Comments: Chronic, fair control. Goal BP <130/80. Advised to follow low sodium diet. She will f/u in six months. She will c/w lisinopril 31m/hctz 12.554mqd. F/u 4-6 mos. - POCT Urinalysis Dipstick (81002) - Microalbumin / creatinine urine ratio - EKG 12-Lead - CMP14+EGFR - CBC - Lipid panel - Amb Referral To Provider Referral Exercise Program (P.R.E.P)  4. Chronic renal disease, stage II Comments: Chronic, reminded to avoid NSAIDs and keep BP well controlled. Will also follow urine microalbumin levels.   5. Acute pain of right shoulder Comments: She is advised to apply pain cream to affected area bid-tid prn. I will refer her to Ortho if her sx persist. Possibly related to anastrozole therapy.   6. Chronic pain of both knees Comments: Chronic, her sx are likely due to OA. I will refer her for radiographic studies if sx worsen. Advised to apply Voltaren  gel to front/back/sides of knees tid prn  7. Other abnormal glucose Comments: Her a1 has been elevated in the past. I will recheck this today. She is encouraged to decrease her intake of sugary foods/beverages.  - POCT Urinalysis Dipstick (81002) - Microalbumin / creatinine urine ratio - EKG 12-Lead - Hemoglobin A1c  8. Malignant neoplasm of lower-outer quadrant of right breast of female, estrogen receptor positive ([place]: She has been treated with lumpectomy, radiation, and who is currently on anti-estrogen therapy with anastrozole. Oncology input appreciated.   9. Class 2 severe obesity due to excess calories with serious comorbidity and body mass index (BMI) of 38.0 to 38.9 in adult ([facility] She is encouraged to aim for at least 150 minutes of exercise/week. I will refer her to PREP program, she is in agreement with her treatment plan.  - Amb Referral To Provider Referral Exercise Program (P.R.E.P)  10. Immunization due - Pneumococcal conjugate vaccine 20-valent (Prevnar 20)  Patient was given opportunity to ask questions. Patient verbalized understanding of the plan and was able to repeat key elements of the plan. All questions were answered to their satisfaction.   I, RoMaximino GreenlandMD, have reviewed all documentation for this visit. The documentation on 02/11/22 for the exam, diagnosis, procedures, and orders are all accurate and complete.   IF YOU HAVE BEEN REFERRED TO A SPECIALIST, IT MAY TAKE 1-2 WEEKS TO SCHEDULE/PROCESS THE REFERRAL. IF YOU HAVE NOT HEARD FROM US/SPECIALIST IN TWO WEEKS, PLEASE GIVE [place] CALL AT [phone] X [phone].   THE PATIENT IS ENCOURAGED TO PRACTICE SOCIAL DISTANCING DUE TO THE COVID-19 PANDEMIC.

## 2022-02-11 NOTE — Patient Instructions (Signed)

## 2022-02-12 LAB — CMP14+EGFR
ALT: 20 IU/L (ref 0–32)
AST: 27 IU/L (ref 0–40)
Albumin/Globulin Ratio: 1.3 (ref 1.2–2.2)
Albumin: 4.1 g/dL (ref 3.8–4.8)
Alkaline Phosphatase: 73 IU/L (ref 44–121)
BUN/Creatinine Ratio: 18 (ref 12–28)
BUN: 13 mg/dL (ref 8–27)
Bilirubin Total: 0.6 mg/dL (ref 0.0–1.2)
CO2: 22 mmol/L (ref 20–29)
Calcium: 11.4 mg/dL — ABNORMAL HIGH (ref 8.7–10.3)
Chloride: 101 mmol/L (ref 96–106)
Creatinine, Ser: 0.71 mg/dL (ref 0.57–1.00)
Globulin, Total: 3.1 g/dL (ref 1.5–4.5)
Glucose: 86 mg/dL (ref 70–99)
Potassium: 4.1 mmol/L (ref 3.5–5.2)
Sodium: 139 mmol/L (ref 134–144)
Total Protein: 7.2 g/dL (ref 6.0–8.5)
eGFR: 88 mL/min/{1.73_m2} (ref >59)

## 2022-02-12 LAB — CBC
Hematocrit: 42.5 % (ref 34.0–46.6)
Hemoglobin: 13.9 g/dL (ref 11.1–15.9)
MCH: 28.5 pg (ref 26.6–33.0)
MCHC: 32.7 g/dL (ref 31.5–35.7)
MCV: 87 fL (ref 79–97)
Platelets: 198 10*3/uL (ref 150–450)
RBC: 4.88 x10E6/uL (ref 3.77–5.28)
RDW: 13.7 % (ref 11.7–15.4)
WBC: 6.1 10*3/uL (ref 3.4–10.8)

## 2022-02-12 LAB — LIPID PANEL
Chol/HDL Ratio: 3.3 ratio (ref 0.0–4.4)
Cholesterol, Total: 182 mg/dL (ref 100–199)
HDL: 55 mg/dL (ref >39)
LDL Chol Calc (NIH): 112 mg/dL — ABNORMAL HIGH (ref 0–99)
Triglycerides: 83 mg/dL (ref 0–149)
VLDL Cholesterol Cal: 15 mg/dL (ref 5–40)

## 2022-02-12 LAB — MICROALBUMIN / CREATININE URINE RATIO
Creatinine, Urine: 57.6 mg/dL
Microalb/Creat Ratio: 16 mg/g creat (ref 0–29)
Microalbumin, Urine: 9.3 ug/mL

## 2022-02-12 LAB — HEMOGLOBIN A1C
Est. average glucose Bld gHb Est-mCnc: 128 mg/dL
Hgb A1c MFr Bld: 6.1 % — ABNORMAL HIGH (ref 4.8–5.6)

## 2022-02-14 ENCOUNTER — Telehealth: Payer: Self-pay

## 2022-02-14 NOTE — Telephone Encounter (Signed)
Call to pt reference PREP referral Explained program to pt Interested in starting. Can do [facility], prefers am class Can do 03/18/22 [id number] M/W  Will call her closer to start of class to do intake interview

## 2022-02-15 LAB — CYTOLOGY - PAP
Comment: NEGATIVE
Diagnosis: NEGATIVE
High risk HPV: NEGATIVE

## 2022-03-04 ENCOUNTER — Other Ambulatory Visit: Payer: Self-pay | Admitting: Internal Medicine

## 2022-03-05 ENCOUNTER — Telehealth: Payer: Self-pay

## 2022-03-05 NOTE — Telephone Encounter (Signed)
LVMT pt requesting call back to discuss date change of starting PREP . 9/25 class is full Can she do Oct 17th T/TH 1pm-215p at [facility] instead?

## 2022-03-05 NOTE — Telephone Encounter (Signed)
Call from pt's daughter.  Explained 03/18/22 is full for PREP.  Daughter asked pt if she could do an afternoon class, pt prefers only morning.  Will call her back when I have another morning class starting.

## 2022-05-22 ENCOUNTER — Telehealth: Payer: Self-pay

## 2022-05-22 NOTE — Progress Notes (Signed)
05-22-2022: Patient was informed that Livalo application is missing a signature. Patient stated she will go by the office to sign application from the front desk.   [place] Pharmacist Assistant [phone]

## 2022-06-01 ENCOUNTER — Other Ambulatory Visit: Payer: Self-pay | Admitting: Internal Medicine

## 2022-06-18 ENCOUNTER — Telehealth: Payer: Self-pay

## 2022-06-18 NOTE — Telephone Encounter (Signed)
LM with family requesting call back to discuss starting PREP on 07/01/22.

## 2022-06-26 ENCOUNTER — Other Ambulatory Visit: Payer: Self-pay

## 2022-06-26 MED ORDER — PITAVASTATIN CALCIUM 2 MG PO TABS: 2.0000 mg | ORAL_TABLET | ORAL | 1 refills | Status: DC

## 2022-06-28 ENCOUNTER — Other Ambulatory Visit: Payer: Self-pay

## 2022-06-28 MED ORDER — PITAVASTATIN CALCIUM 2 MG PO TABS: 2.0000 mg | ORAL_TABLET | ORAL | 1 refills | Status: DC

## 2022-07-05 ENCOUNTER — Other Ambulatory Visit: Payer: Self-pay | Admitting: Internal Medicine

## 2022-07-11 ENCOUNTER — Other Ambulatory Visit: Payer: Self-pay

## 2022-07-11 MED ORDER — PRAVASTATIN SODIUM 40 MG PO TABS: 40.0000 mg | ORAL_TABLET | Freq: Every day | ORAL | 2 refills | Status: DC

## 2022-08-08 ENCOUNTER — Encounter: Payer: Self-pay | Admitting: Internal Medicine

## 2022-08-08 ENCOUNTER — Ambulatory Visit: Payer: Medicare HMO | Admitting: Internal Medicine

## 2022-08-08 ENCOUNTER — Ambulatory Visit: Payer: Medicare HMO

## 2022-08-08 VITALS — BP 118/80 | HR 79 | Temp 97.9°F | Ht 59.2 in | Wt 196.2 lb

## 2022-08-08 VITALS — BP 118/80 | HR 79 | Temp 97.9°F | Ht 59.0 in | Wt 196.0 lb

## 2022-08-08 DIAGNOSIS — Z6839 Body mass index (BMI) 39.0-39.9, adult: Secondary | ICD-10-CM

## 2022-08-08 DIAGNOSIS — I129 Hypertensive chronic kidney disease with stage 1 through stage 4 chronic kidney disease, or unspecified chronic kidney disease: Secondary | ICD-10-CM

## 2022-08-08 DIAGNOSIS — Z0000 Encounter for general adult medical examination without abnormal findings: Secondary | ICD-10-CM

## 2022-08-08 DIAGNOSIS — R7309 Other abnormal glucose: Secondary | ICD-10-CM | POA: Diagnosis not present

## 2022-08-08 DIAGNOSIS — N182 Chronic kidney disease, stage 2 (mild): Secondary | ICD-10-CM

## 2022-08-08 NOTE — Patient Instructions (Addendum)
When you run out of Livalo, wait 5 days prior to starting pravastatin. Call Dr. B[facility] when you have started the pravastatin  Hypertension, Adult Hypertension is another name for high blood pressure. High blood pressure forces your heart to work harder to pump blood. This can cause problems over time. There are two numbers in a blood pressure reading. There is a top number (systolic) over a bottom number (diastolic). It is best to have a blood pressure that is below 120/80. What are the causes? The cause of this condition is not known. Some other conditions can lead to high blood pressure. What increases the risk? Some lifestyle factors can make you more likely to develop high blood pressure: Smoking. Not getting enough exercise or physical activity. Being overweight. Having too much fat, sugar, calories, or salt (sodium) in your diet. Drinking too much alcohol. Other risk factors include: Having any of these conditions: Heart disease. Diabetes. High cholesterol. Kidney disease. Obstructive sleep apnea. Having a family history of high blood pressure and high cholesterol. Age. The risk increases with age. Stress. What are the signs or symptoms? High blood pressure may not cause symptoms. Very high blood pressure (hypertensive crisis) may cause: Headache. Fast or uneven heartbeats (palpitations). Shortness of breath. Nosebleed. Vomiting or feeling like you may vomit (nauseous). Changes in how you see. Very bad chest pain. Feeling dizzy. Seizures. How is this treated? This condition is treated by making healthy lifestyle changes, such as: Eating healthy foods. Exercising more. Drinking less alcohol. Your doctor may prescribe medicine if lifestyle changes do not help enough and if: Your top number is above 130. Your bottom number is above 80. Your personal target blood pressure may vary. Follow these instructions at home: Eating and drinking  If told, follow the DASH  eating plan. To follow this plan: Fill one half of your plate at each meal with fruits and vegetables. Fill one fourth of your plate at each meal with whole grains. Whole grains include whole-wheat pasta, brown rice, and whole-grain bread. Eat or drink low-fat dairy products, such as skim milk or low-fat yogurt. Fill one fourth of your plate at each meal with low-fat (lean) proteins. Low-fat proteins include fish, chicken without skin, eggs, beans, and tofu. Avoid fatty meat, cured and processed meat, or chicken with skin. Avoid pre-made or processed food. Limit the amount of salt in your diet to less than 1,500 mg each day. Do not drink alcohol if: Your doctor tells you not to drink. You are pregnant, may be pregnant, or are planning to become pregnant. If you drink alcohol: Limit how much you have to: 0-1 drink a day for women. 0-2 drinks a day for men. Know how much alcohol is in your drink. In the [place] one drink equals one 12 oz bottle of beer (355 mL), one 5 oz glass of wine (148 mL), or one 1 oz glass of hard liquor (44 mL). Lifestyle  Work with your doctor to stay at a healthy weight or to lose weight. Ask your doctor what the best weight is for you. Get at least 30 minutes of exercise that causes your heart to beat faster (aerobic exercise) most days of the week. This may include walking, swimming, or biking. Get at least 30 minutes of exercise that strengthens your muscles (resistance exercise) at least 3 days a week. This may include lifting weights or doing Pilates. Do not smoke or use any products that contain nicotine or tobacco. If you need help quitting, ask  your doctor. Check your blood pressure at home as told by your doctor. Keep all follow-up visits. Medicines Take over-the-counter and prescription medicines only as told by your doctor. Follow directions carefully. Do not skip doses of blood pressure medicine. The medicine does not work as well if you skip doses.  Skipping doses also puts you at risk for problems. Ask your doctor about side effects or reactions to medicines that you should watch for. Contact a doctor if: You think you are having a reaction to the medicine you are taking. You have headaches that keep coming back. You feel dizzy. You have swelling in your ankles. You have trouble with your vision. Get help right away if: You get a very bad headache. You start to feel mixed up (confused). You feel weak or numb. You feel faint. You have very bad pain in your: Chest. Belly (abdomen). You vomit more than once. You have trouble breathing. These symptoms may be an emergency. Get help right away. Call 911. Do not wait to see if the symptoms will go away. Do not drive yourself to the hospital. Summary Hypertension is another name for high blood pressure. High blood pressure forces your heart to work harder to pump blood. For most people, a normal blood pressure is less than 120/80. Making healthy choices can help lower blood pressure. If your blood pressure does not get lower with healthy choices, you may need to take medicine. This information is not intended to replace advice given to you by your health care provider. Make sure you discuss any questions you have with your health care provider. Document Revised: 03/29/2021 Document Reviewed: 03/29/2021 Elsevier Patient Education  [place].

## 2022-08-08 NOTE — Progress Notes (Signed)
Subjective:   Cynthia Barton is a 77 y.o. female who presents for Medicare Annual (Subsequent) preventive examination.  Review of Systems     Cardiac Risk Factors include: advanced age (>57mn, >>34women);hypertension;obesity (BMI >30kg/m2)     Objective:    Today's Vitals   08/08/22 0906  BP: 118/80  Pulse: 79  Temp: 97.9 F (36.6 C)  TempSrc: Oral  SpO2: 95%  Weight: 196 lb 3.2 oz (89 kg)  Height: 4' 11.2" (1.504 m)   Body mass index is 39.36 kg/m.     08/08/2022    9:18 AM 08/11/2021    5:58 PM 07/26/2021    8:42 AM 07/20/2020    9:15 AM 07/14/2019    8:54 AM 07/09/2018    9:41 AM 04/02/2017   11:08 AM  Advanced Directives  Does Patient Have a Medical Advance Directive? Yes Yes Yes Yes No No No  Type of AParamed[place]of [place]Living will [place]Living will HSeven [place] FordLiving will [place]Living will     Does patient want to make changes to medical advance directive?  No - Patient declined       Copy of HHuntsdalein Chart? No - copy requested No - copy requested No - copy requested No - copy requested     Would patient like information on creating a medical advance directive?     Yes (MAU/Ambulatory/Procedural Areas - Information given) No - Patient declined     Current Medications (verified) Outpatient Encounter Medications as of 08/08/2022  Medication Sig   Ascorbic Acid (VITAMIN C) 1000 MG tablet Take 1 tablet (1,000 mg total) by mouth daily.   Black Elderberry,Berry-Flower, 575 MG CAPS Take 1 capsule by mouth daily.   Calcium Carbonate-Vitamin D 600-400 MG-UNIT tablet Take 1 tablet by mouth every other day. 12026m  Cholecalciferol (VITAMIN D3) 125 MCG (5000 UT) CAPS Take by mouth.   hydrochlorothiazide (MICROZIDE) 12.5 MG capsule Take 1 capsule by mouth once daily   lisinopril (ZESTRIL) 10 MG tablet Take 1 tablet by mouth once daily   Multiple Vitamins-Minerals  (MULTIVITAMIN WITH MINERALS) tablet Take 1 tablet by mouth daily.   pravastatin (PRAVACHOL) 40 MG tablet Take 1 tablet (40 mg total) by mouth daily.   diclofenac Sodium (VOLTAREN) 1 % GEL Apply 4 g topically 4 (four) times daily. Prn knee pain (Patient not taking: Reported on 02/11/2022)   Pitavastatin Calcium (LIVALO) 2 MG TABS Take 1 tablet (2 mg total) by mouth 3 (three) times a week. Takes on Monday Wednesday and Friday (Patient not taking: Reported on 08/08/2022)   No facility-administered encounter medications on file as of 08/08/2022.    Allergies (verified) Patient has no known allergies.   History: Past Medical History:  Diagnosis Date   Bilateral carpal tunnel syndrome 02/23/2020   [place] [place]   ER+/PR+/Her2- right breast   [place] lower[place]outer [place] [place]   Hyperlipidemia    Hypertension    Radiation 11/10/14-12/09/14   Right Breast   Uterine prolapse 2016   Wears dentures    top   Wears glasses    Past Surgical History:  Procedure Laterality Date   BREAST SURGERY  1980   rt br bx-no anesth   COLONOSCOPY  07/27/12   mild diverticulosis   lumpectomy right breast Right 09/30/14   TUBAL LIGATION     Family History  Problem Relation Age of Onset  Heart disease Father    Melanoma Mother    Kidney failure Sister 67   Heart disease Maternal Uncle    Social History   Socioeconomic History   Marital status: Divorced    Spouse name: Not on file   Number of children: Not on file   Years of education: Not on file   Highest education level: Not on file  Occupational History   Occupation: retired  Tobacco Use   Smoking status: Never   Smokeless tobacco: Never  Vaping Use   Vaping Use: Never used  Substance and Sexual Activity   Alcohol use: Not Currently    Alcohol/week: 0.0 standard drinks of alcohol   Drug use: No   Sexual activity: Not Currently    Comment: 1st intercourse 77 yo-Fewer than 5 partners   Other Topics Concern   Not on file  Social History Narrative   Not on file   Social Determinants of Health   Financial Resource Strain: Low Risk  (08/08/2022)   Overall Financial Resource Strain (CARDIA)    Difficulty of Paying Living Expenses: Not hard at all  Food Insecurity: No Food Insecurity (08/08/2022)   Hunger Vital Sign    Worried About Running Out of Food in the Last Year: Never true    [place] in the Last Year: Never true  Transportation Needs: No Transportation Needs (08/08/2022)   PRAPARE - Hydrologist (Medical): No    Lack of Transportation (Non-Medical): No  Physical Activity: Inactive (08/08/2022)   Exercise Vital Sign    Days of Exercise per Week: 0 days    Minutes of Exercise per Session: 0 min  Stress: No Stress Concern Present (08/08/2022)   [place]    Feeling of Stress : Not at all  Social Connections: Not on file    Tobacco Counseling Counseling given: Not Answered   Clinical Intake:  Pre-visit preparation completed: Yes  Pain : No/denies pain     Nutritional Status: BMI > 30  Obese Nutritional Risks: None Diabetes: No  How often do you need to have someone help you when you read instructions, pamphlets, or other written materials from your doctor or pharmacy?: 1 - Never  Diabetic? no  Interpreter Needed?: No  Information entered by :: NAllen LPN   Activities of Daily Living    08/08/2022    9:19 AM  In your present state of health, do you have any difficulty performing the following activities:  Hearing? 0  Comment has hearing aids  Vision? 0  Difficulty concentrating or making decisions? 0  Walking or climbing stairs? 1  Dressing or bathing? 0  Doing errands, shopping? 1  Preparing Food and eating ? N  Using the Toilet? N  In the past six months, have you accidently leaked urine? Y  Do you have problems with loss of bowel  control? N  Managing your Medications? N  Managing your Finances? N  Housekeeping or managing your Housekeeping? N    Patient Care Team: Glendale Chard, MD as PCP - General (Internal Medicine) Jovita Kussmaul, MD as Consulting Physician (General Surgery) Nicholas Lose, MD as Consulting Physician (Hematology and Oncology) Thea Silversmith, MD as Consulting Physician (Radiation Oncology) Rockwell Germany, RN as Registered Nurse Mauro Kaufmann, RN as Registered Nurse Holley Bouche, NP (Inactive) as Nurse Practitioner (Nurse Practitioner) Sylvan Cheese, NP as Nurse Practitioner (Hematology and Oncology) Cyril Mourning,  [place] (Inactive) as Pharmacist Warehouse manager)  Indicate any recent Medical Services you may have received from other than [facility] providers in the past year (date may be approximate).     Assessment:   This is a routine wellness examination for Fabiola.  Hearing/Vision screen Vision Screening - Comments:: Regular eye exams, [facility]  Dietary issues and exercise activities discussed: Current Exercise Habits: The patient does not participate in regular exercise at present   Goals Addressed             This Visit's Progress    Patient Stated       08/08/2022, wants to lose weight       Depression Screen    08/08/2022    9:18 AM 02/11/2022    2:30 PM 07/26/2021    8:43 AM 07/20/2020    9:18 AM 01/17/2020   10:05 AM 07/14/2019    8:57 AM 01/12/2019    9:23 AM  PHQ 2/9 Scores  PHQ - 2 Score 0 0 0 0 0 0 0  PHQ- 9 Score      0     Fall Risk    08/08/2022    9:18 AM 02/11/2022    2:19 PM 07/26/2021    8:43 AM 07/20/2020    9:17 AM 07/14/2019    8:56 AM  [place] in the past year? 0 1 0 1 0  Comment    missed a step   Number falls in past yr: 0 0  0   Injury with Fall? 0 1  0   Risk for fall due to : Impaired mobility;Impaired balance/gait History of fall(s) Medication side effect Medication side effect Medication side effect  Follow  up Falls prevention discussed;Education provided;Falls evaluation completed Falls evaluation completed Falls evaluation completed;Education provided;Falls prevention discussed Falls evaluation completed;Education provided;Falls prevention discussed     FALL RISK PREVENTION PERTAINING TO THE HOME:  Any stairs in or around the home? Yes  If so, are there any without handrails? No  Home free of loose throw rugs in walkways, pet beds, electrical cords, etc? Yes  Adequate lighting in your home to reduce risk of falls? Yes   ASSISTIVE DEVICES UTILIZED TO PREVENT FALLS:  Life alert? No  Use of a cane, walker or w/c? Yes  Grab bars in the bathroom? No  Shower chair or bench in shower? No  Elevated toilet seat or a handicapped toilet? Yes   TIMED UP AND GO:  Was the test performed? Yes .  Length of time to ambulate 10 feet: 7 sec.   Gait slow and steady with assistive device  Cognitive Function:        08/08/2022    9:20 AM 07/26/2021    8:44 AM 07/20/2020    9:20 AM 07/14/2019    8:58 AM 07/09/2018    9:46 AM  6CIT Screen  What Year? 0 points 0 points 0 points 0 points 0 points  What month? 0 points 0 points 0 points 0 points 0 points  What time? 0 points 0 points 0 points 0 points 0 points  Count back from 20 2 points 4 points 2 points 0 points 0 points  Months in reverse 2 points 4 points 2 points 0 points 0 points  Repeat phrase 4 points 6 points 6 points 2 points 2 points  Total Score 8 points 14 points 10 points 2 points 2 points    Immunizations Immunization History  Administered Date(s) Administered  Fluad Quad(high Dose 65+) 07/20/2020, 05/30/2022   Influenza, High Dose Seasonal PF 04/23/2018, 03/22/2019   PFIZER Comirnaty(Gray Top)Covid-19 Tri-Sucrose Vaccine 10/24/2020   PFIZER(Purple Top)SARS-COV-2 Vaccination 07/30/2019, 08/20/2019, 05/31/2020   PNEUMOCOCCAL CONJUGATE-20 02/11/2022   Tdap 01/31/2014   Zoster Recombinat (Shingrix) 02/15/2019, 04/16/2019    TDAP  status: Up to date  Flu Vaccine status: Up to date  Pneumococcal vaccine status: Up to date  Covid-19 vaccine status: Completed vaccines  Qualifies for Shingles Vaccine? Yes   Zostavax completed Yes   Shingrix Completed?: Yes  Screening Tests Health Maintenance  Topic Date Due   COVID-19 Vaccine (5 - 2023-24 season) 02/22/2022   Medicare Annual Wellness (AWV)  07/26/2022   DTaP/Tdap/Td (2 - Td or Tdap) 02/01/2024   Pneumonia Vaccine 11+ Years old  Completed   INFLUENZA VACCINE  Completed   DEXA SCAN  Completed   Hepatitis C Screening  Completed   Zoster Vaccines- Shingrix  Completed   HPV VACCINES  Aged Out   COLONOSCOPY (Pts 45-38yr Insurance coverage will need to be confirmed)  Discontinued    Health Maintenance  Health Maintenance Due  Topic Date Due   COVID-19 Vaccine (5 - 2023-24 season) 02/22/2022   Medicare Annual Wellness (AWV)  07/26/2022    Colorectal cancer screening: No longer required.   Mammogram status: Completed 01/22/2022. Repeat every year  Bone Density status: Completed 04/07/2019.   Lung Cancer Screening: (Low Dose CT Chest recommended if Age 77-80years, 30 pack-year currently smoking OR have quit w/in 15years.) does not qualify.   Lung Cancer Screening Referral: no  Additional Screening:  Hepatitis C Screening: does qualify; Completed 06/15/2012  Vision Screening: Recommended annual ophthalmology exams for early detection of glaucoma and other disorders of the eye. Is the patient up to date with their annual eye exam?  Yes  Who is the provider or what is the name of the office in which the patient attends annual [facility] exams[facility] If pt is not established with a provider, would they like to be referred to a provider to establish care? No .   Dental Screening: Recommended annual dental exams for proper oral hygiene  Community Resource Referral / Chronic Care Management: CRR required this visit?  No   CCM required this visit?  No       Plan:     I have personally reviewed and noted the following in the patient's chart:   Medical and social history Use of alcohol, tobacco or illicit drugs  Current medications and supplements including opioid prescriptions. Patient is not currently taking opioid prescriptions. Functional ability and status Nutritional status Physical activity Advanced directives List of other physicians Hospitalizations, surgeries, and ER visits in previous 12 months Vitals Screenings to include cognitive, depression, and falls Referrals and appointments  In addition, I have reviewed and discussed with patient certain preventive protocols, quality metrics, and best practice recommendations. A written personalized care plan for preventive services as well as general preventive health recommendations were provided to patient.     NKellie Simmering LPN   [id number]  Nurse Notes: none

## 2022-08-08 NOTE — Progress Notes (Signed)
I,Cynthia Barton,acting as a scribe for Cynthia Greenland, MD.,have documented all relevant documentation on the behalf of Cynthia Greenland, MD,as directed by  Cynthia Greenland, MD while in the presence of Cynthia Greenland, MD.    Subjective:     Patient ID: Cynthia Barton , female    DOB: 05-06-1946 , 77 y.o.   MRN: [id number]   Chief Complaint  Patient presents with   Hypertension    HPI  Patient presents today for a blood pressure f/u. She reports compliance with meds. She denies having any headaches, chest pain and shortness of breath. She reports feeling well. She is accompanied by her daughter Cynthia Barton. She has no specific concerns at this time.   Hypertension This is a chronic problem. The current episode started more than 1 year ago. The problem has been gradually improving since onset. The problem is controlled. Pertinent negatives include no blurred vision or palpitations. Risk factors for coronary artery disease include dyslipidemia, post-menopausal state, obesity and sedentary lifestyle. The current treatment provides moderate improvement. Compliance problems include exercise.  Hypertensive end-organ damage includes kidney disease.     Past Medical History:  Diagnosis Date   Bilateral carpal tunnel syndrome 02/23/2020   Breast cancer ([place]) 09/07/2014   ER+/PR+/Her2- right breast   Breast cancer of lower-outer quadrant of right female breast ([place]) 09/06/2014   Hyperlipidemia    Hypertension    Radiation 11/10/14-12/09/14   Right Breast   Uterine prolapse 2016   Wears dentures    top   Wears glasses      Family History  Problem Relation Age of Onset   Heart disease Father    Melanoma Mother    Kidney failure Sister 29   Heart disease Maternal Uncle      Current Outpatient Medications:    Ascorbic Acid (VITAMIN C) 1000 MG tablet, Take 1 tablet (1,000 mg total) by mouth daily., Disp: , Rfl:    Black Elderberry,Berry-Flower, 575 MG CAPS, Take 1 capsule by mouth daily., Disp:  , Rfl:    Calcium Carbonate-Vitamin D 600-400 MG-UNIT tablet, Take 1 tablet by mouth every other day. 1278m, Disp: , Rfl:    Cholecalciferol (VITAMIN D3) 125 MCG (5000 UT) CAPS, Take by mouth., Disp: , Rfl:    hydrochlorothiazide (MICROZIDE) 12.5 MG capsule, Take 1 capsule by mouth once daily, Disp: 90 capsule, Rfl: 0   lisinopril (ZESTRIL) 10 MG tablet, Take 1 tablet by mouth once daily, Disp: 90 tablet, Rfl: 0   Multiple Vitamins-Minerals (MULTIVITAMIN WITH MINERALS) tablet, Take 1 tablet by mouth daily., Disp: , Rfl:    pravastatin (PRAVACHOL) 40 MG tablet, Take 1 tablet (40 mg total) by mouth daily., Disp: 90 tablet, Rfl: 2   diclofenac Sodium (VOLTAREN) 1 % GEL, Apply 4 g topically 4 (four) times daily. Prn knee pain (Patient not taking: Reported on 02/11/2022), Disp: 350 g, Rfl: 0   No Known Allergies   Review of Systems  Constitutional: Negative.   Eyes:  Negative for blurred vision.  Respiratory: Negative.    Cardiovascular: Negative.  Negative for palpitations.  Neurological: Negative.   Psychiatric/Behavioral: Negative.       Today's Vitals   08/08/22 0919  BP: 118/80  Pulse: 79  Temp: 97.9 F (36.6 C)  SpO2: 95%  Weight: 196 lb (88.9 kg)  Height: 4' 11"$  (1.499 m)   Body mass index is 39.59 kg/m.  Wt Readings from Last 3 Encounters:  08/08/22 196 lb (88.9 kg)  08/08/22 196 lb  3.2 oz (89 kg)  02/11/22 187 lb 3.2 oz (84.9 kg)    Objective:  Physical Exam Vitals and nursing note reviewed.  Constitutional:      Appearance: Normal appearance. She is obese.  HENT:     Head: Normocephalic and atraumatic.     Nose:     Comments: Masked     Mouth/Throat:     Comments: Masked  Eyes:     Extraocular Movements: Extraocular movements intact.  Cardiovascular:     Rate and Rhythm: Normal rate and regular rhythm.     Heart sounds: Normal heart sounds.  Pulmonary:     Effort: Pulmonary effort is normal.     Breath sounds: Normal breath sounds.  Musculoskeletal:      Cervical back: Normal range of motion.  Skin:    General: Skin is warm.  Neurological:     General: No focal deficit present.     Mental Status: She is alert.  Psychiatric:        Mood and Affect: Mood normal.        Behavior: Behavior normal.      Assessment And Plan:     1. Benign hypertension with CKD (chronic kidney disease), stage II Comments: Chronic, controlled. She wil c/w hctz 12.39m and lisinopril 178mfor now. Encouraged to follow low sodium diet. - CMP14+EGFR - CBC - TSH  2. Other abnormal glucose Comments: Previous labs reviewed, her a1c has been elevated in the past. I will recheck today, encouraged to decrease intake of sugary foods/drinks. - CMP14+EGFR - Hemoglobin A1c  3. Class 2 severe obesity due to excess calories with serious comorbidity and body mass index (BMI) of 39.0 to 39.9 in adult ([facility] She is encouraged to strive for BMI less than 30 to decrease cardiac risk. Advised to aim for at least 150 minutes of exercise per week.    Patient was given opportunity to ask questions. Patient verbalized understanding of the plan and was able to repeat key elements of the plan. All questions were answered to their satisfaction.   I, RoMaximino GreenlandMD, have reviewed all documentation for this visit. The documentation on 08/08/22 for the exam, diagnosis, procedures, and orders are all accurate and complete.   IF YOU HAVE BEEN REFERRED TO A SPECIALIST, IT MAY TAKE 1-2 WEEKS TO SCHEDULE/PROCESS THE REFERRAL. IF YOU HAVE NOT HEARD FROM US/SPECIALIST IN TWO WEEKS, PLEASE GIVE [place] CALL AT [phone].   THE PATIENT IS ENCOURAGED TO PRACTICE SOCIAL DISTANCING DUE TO THE COVID-19 PANDEMIC.

## 2022-08-08 NOTE — Patient Instructions (Signed)
Cynthia Barton , Thank you for taking time to come for your Medicare Wellness Visit. I appreciate your ongoing commitment to your health goals. Please review the following plan we discussed and let me know if I can assist you in the future.   These are the goals we discussed:  Goals       Patient Stated (pt-stated)      Wants to go on a cruise.      Patient Stated      07/20/2020, start exercising and stay healthy      Patient Stated      07/26/2021, stay healthy      Patient Stated      08/08/2022, wants to lose weight      Pharmacy Care Plan      CARE PLAN ENTRY (see longitudinal plan of care for additional care plan information)  Current Barriers:  Chronic Disease Management support, education, and care coordination needs related to Hypertension, Hyperlipidemia, Osteopenia, and Prediabetes   Hypertension BP Readings from Last 3 Encounters:  01/18/20 (!) 130/78  10/08/19 (!) 156/92  07/14/19 (!) 142/80  Pharmacist Clinical Goal(s): Over the next 180 days, patient will work with PharmD and providers to maintain BP goal <130/80 Current regimen:  Lisinopril 38m daily Hydrochlorothiazide 12.578mdaily Interventions: Provided dietary and exercise recommendations Discussed appropriate goals for blood pressure (less than 130/80) Patient self care activities - Over the next 180 days, patient will: Check BP once weekly, document, and provide at future appointments Ensure daily salt intake < 2300 mg/day Increase exercise to 30 minutes daily, 5 times per week  Hyperlipidemia Lab Results  Component Value Date/Time   LDLCALC 99 01/18/2020 09:59 AM  Pharmacist Clinical Goal(s): Over the next 180 days, patient will work with PharmD and providers to maintain LDL goal < 100 Current regimen:  Livalo 80m53monday, Wednesday, and Friday Interventions: Patient signed application for refills of Livalo and provided proof of income documents Discussed cholesterol levels are at goal Provided  dietary and exercise recommendations Patient self care activities - Over the next 180 days, patient will: Continue Livalo three times weekly Notify PharmD if any problems with Livalo patient assistance shipments Increase exercise to 30 minutes daily, 5 times per week  Prediabetes Lab Results  Component Value Date/Time   HGBA1C 6.4 (H) 01/18/2020 09:59 AM   HGBA1C 6.1 (H) 07/14/2019 09:37 AM   HGBA1C 6.1 11/27/2017 12:00 AM  Pharmacist Clinical Goal(s): Over the next 180 days, patient will work with PharmD and providers to maintain A1c goal <6.5% Current regimen:  N/A Interventions: Provided dietary and exercise recommendations Discussed appropriate goals for Hemoglobin A1c (<5.7% normal, 5.7-6.4% prediabetes) Patient education regarding the importance of diet and exercise to lower HgbA1c Patient self care activities - Over the next 180 days, patient will: Check blood sugar if symptomatic, document, and provide at future appointments Contact provider with any episodes of hypoglycemia Increase exercise to 30 minutes daily, 5 times per week  Osteopenia Pharmacist Clinical Goal(s) Over the next 90 days, patient will work with PharmD and providers to build and maintain bone density Current regimen:  Cholecalciferol 5000 units daily Calcium carbonate/Vitamin D 1200m40m0 IU every other day Interventions: Discussed patient's elevated calcium level at last office visit Determined patient started taking calcium 1200mg380mplement every other day instead of daily Provided patient education regarding calcium content in foods Discussed 1200mg 3ml daily calcium intake recommended from diet and supplements Discussed the importance of weight bearing exercises three times weekly Patient self care  activities - Over the next 90 days, patient will: Determine how much calcium she is getting through dietary sources daily Review calcium content in multivitamin Begin weight bearing exercises three  days per week  Medication management Pharmacist Clinical Goal(s): Over the next 180 days, patient will work with PharmD and providers to maintain optimal medication adherence Current pharmacy: [facility] Interventions Comprehensive medication review performed. Continue current medication management strategy Patient self care activities - Over the next 180 days, patient will: Focus on medication adherence by using a pill box Take medications as prescribed Report any questions or concerns to PharmD and/or provider(s)  Initial goal documentation       Track and Manage My Blood Pressure-Hypertension      Timeframe:  Long-Range Goal Priority:  High Start Date:                             Expected End Date:                       Follow Up Date 01/24/2021   - check blood pressure 3 times per week - choose a place to take my blood pressure (home, clinic or office, retail store) - write blood pressure results in a log or diary    Why is this important?   You won't feel high blood pressure, but it can still hurt your blood vessels.  High blood pressure can cause heart or kidney problems. It can also cause a stroke.  Making lifestyle changes like losing a little weight or eating less salt will help.  Checking your blood pressure at home and at different times of the day can help to control blood pressure.  If the doctor prescribes medicine remember to take it the way the doctor ordered.  Call the office if you cannot afford the medicine or if there are questions about it.     Notes:       Weight (lb) < 200 lb (90.7 kg)      07/14/2019, wants to weigh 190 pounds        This is a list of the screening recommended for you and due dates:  Health Maintenance  Topic Date Due   COVID-19 Vaccine (5 - 2023-24 season) 02/22/2022   Medicare Annual Wellness Visit  08/09/2023   DTaP/Tdap/Td vaccine (2 - Td or Tdap) 02/01/2024   Pneumonia Vaccine  Completed   Flu Shot  Completed   DEXA scan  (bone density measurement)  Completed   Hepatitis C Screening: USPSTF Recommendation to screen - Ages 74-79 yo.  Completed   Zoster (Shingles) Vaccine  Completed   HPV Vaccine  Aged Out   Colon Cancer Screening  Discontinued    Advanced directives: Please bring a copy of your POA (Power of Attorney) and/or Living Will to your next appointment.   Conditions/risks identified: none  Next appointment: Follow up in one year for your annual wellness visit    Preventive Care 65 Years and Older, Female Preventive care refers to lifestyle choices and visits with your health care provider that can promote health and wellness. What does preventive care include? A yearly physical exam. This is also called an annual well check. Dental exams once or twice a year. Routine eye exams. Ask your health care provider how often you should have your eyes checked. Personal lifestyle choices, including: Daily care of your teeth and gums. Regular physical activity. Eating a healthy diet.  Avoiding tobacco and drug use. Limiting alcohol use. Practicing safe sex. Taking low-dose aspirin every day. Taking vitamin and mineral supplements as recommended by your health care provider. What happens during an annual well check? The services and screenings done by your health care provider during your annual well check will depend on your age, overall health, lifestyle risk factors, and family history of disease. Counseling  Your health care provider may ask you questions about your: Alcohol use. Tobacco use. Drug use. Emotional well-being. Home and relationship well-being. Sexual activity. Eating habits. History of falls. Memory and ability to understand (cognition). Work and work Statistician. Reproductive health. Screening  You may have the following tests or measurements: Height, weight, and BMI. Blood pressure. Lipid and cholesterol levels. These may be checked every 5 years, or more frequently if  you are over 20 years old. Skin check. Lung cancer screening. You may have this screening every year starting at age 70 if you have a 30-pack-year history of smoking and currently smoke or have quit within the past 15 years. Fecal occult blood test (FOBT) of the stool. You may have this test every year starting at age 59. Flexible sigmoidoscopy or colonoscopy. You may have a sigmoidoscopy every 5 years or a colonoscopy every 10 years starting at age 75. Hepatitis C blood test. Hepatitis B blood test. Sexually transmitted disease (STD) testing. Diabetes screening. This is done by checking your blood sugar (glucose) after you have not eaten for a while (fasting). You may have this done every 1-3 years. Bone density scan. This is done to screen for osteoporosis. You may have this done starting at age 81. Mammogram. This may be done every 1-2 years. Talk to your health care provider about how often you should have regular mammograms. Talk with your health care provider about your test results, treatment options, and if necessary, the need for more tests. Vaccines  Your health care provider may recommend certain vaccines, such as: Influenza vaccine. This is recommended every year. Tetanus, diphtheria, and acellular pertussis (Tdap, Td) vaccine. You may need a Td booster every 10 years. Zoster vaccine. You may need this after age 54. Pneumococcal 13-valent conjugate (PCV13) vaccine. One dose is recommended after age 25. Pneumococcal polysaccharide (PPSV23) vaccine. One dose is recommended after age 34. Talk to your health care provider about which screenings and vaccines you need and how often you need them. This information is not intended to replace advice given to you by your health care provider. Make sure you discuss any questions you have with your health care provider. Document Released: 07/07/2015 Document Revised: 02/28/2016 Document Reviewed: 04/11/2015 Elsevier Interactive Patient  Education  2017 [place] Prevention in the Home Falls can cause injuries. They can happen to people of all ages. There are many things you can do to make your home safe and to help prevent falls. What can I do on the outside of my home? Regularly fix the edges of walkways and driveways and fix any cracks. Remove anything that might make you trip as you walk through a door, such as a raised step or threshold. Trim any bushes or trees on the path to your home. Use bright outdoor lighting. Clear any walking paths of anything that might make someone trip, such as rocks or tools. Regularly check to see if handrails are loose or broken. Make sure that both sides of any steps have handrails. Any raised decks and porches should have guardrails on the edges. Have any leaves,  snow, or ice cleared regularly. Use sand or salt on walking paths during winter. Clean up any spills in your garage right away. This includes oil or grease spills. What can I do in the bathroom? Use night lights. Install grab bars by the toilet and in the tub and shower. Do not use towel bars as grab bars. Use non-skid mats or decals in the tub or shower. If you need to sit down in the shower, use a plastic, non-slip stool. Keep the floor dry. Clean up any water that spills on the floor as soon as it happens. Remove soap buildup in the tub or shower regularly. Attach bath mats securely with double-sided non-slip rug tape. Do not have throw rugs and other things on the floor that can make you trip. What can I do in the bedroom? Use night lights. Make sure that you have a light by your bed that is easy to reach. Do not use any sheets or blankets that are too big for your bed. They should not hang down onto the floor. Have a firm chair that has side arms. You can use this for support while you get dressed. Do not have throw rugs and other things on the floor that can make you trip. What can I do in the  kitchen? Clean up any spills right away. Avoid walking on wet floors. Keep items that you use a lot in easy-to-reach places. If you need to reach something above you, use a strong step stool that has a grab bar. Keep electrical cords out of the way. Do not use floor polish or wax that makes floors slippery. If you must use wax, use non-skid floor wax. Do not have throw rugs and other things on the floor that can make you trip. What can I do with my stairs? Do not leave any items on the stairs. Make sure that there are handrails on both sides of the stairs and use them. Fix handrails that are broken or loose. Make sure that handrails are as long as the stairways. Check any carpeting to make sure that it is firmly attached to the stairs. Fix any carpet that is loose or worn. Avoid having throw rugs at the top or bottom of the stairs. If you do have throw rugs, attach them to the floor with carpet tape. Make sure that you have a light switch at the top of the stairs and the bottom of the stairs. If you do not have them, ask someone to add them for you. What else can I do to help prevent falls? Wear shoes that: Do not have high heels. Have rubber bottoms. Are comfortable and fit you well. Are closed at the toe. Do not wear sandals. If you use a stepladder: Make sure that it is fully opened. Do not climb a closed stepladder. Make sure that both sides of the stepladder are locked into place. Ask someone to hold it for you, if possible. Clearly mark and make sure that you can see: Any grab bars or handrails. First and last steps. Where the edge of each step is. Use tools that help you move around (mobility aids) if they are needed. These include: Canes. Walkers. Scooters. Crutches. Turn on the lights when you go into a dark area. Replace any light bulbs as soon as they burn out. Set up your furniture so you have a clear path. Avoid moving your furniture around. If any of your floors are  uneven, fix them. If there  are any pets around you, be aware of where they are. Review your medicines with your doctor. Some medicines can make you feel dizzy. This can increase your chance of falling. Ask your doctor what other things that you can do to help prevent falls. This information is not intended to replace advice given to you by your health care provider. Make sure you discuss any questions you have with your health care provider. Document Released: 04/06/2009 Document Revised: 11/16/2015 Document Reviewed: 07/15/2014 Elsevier Interactive Patient Education  2017 [employer].

## 2022-08-09 ENCOUNTER — Other Ambulatory Visit: Payer: Self-pay | Admitting: Internal Medicine

## 2022-08-09 LAB — CMP14+EGFR
ALT: 17 IU/L (ref 0–32)
AST: 29 IU/L (ref 0–40)
Albumin/Globulin Ratio: 1.4 (ref 1.2–2.2)
Albumin: 4.2 g/dL (ref 3.8–4.8)
Alkaline Phosphatase: 71 IU/L (ref 44–121)
BUN/Creatinine Ratio: 14 (ref 12–28)
BUN: 12 mg/dL (ref 8–27)
Bilirubin Total: 0.6 mg/dL (ref 0.0–1.2)
CO2: 23 mmol/L (ref 20–29)
Calcium: 11.2 mg/dL — ABNORMAL HIGH (ref 8.7–10.3)
Chloride: 103 mmol/L (ref 96–106)
Creatinine, Ser: 0.88 mg/dL (ref 0.57–1.00)
Globulin, Total: 3 g/dL (ref 1.5–4.5)
Glucose: 106 mg/dL — ABNORMAL HIGH (ref 70–99)
Potassium: 4 mmol/L (ref 3.5–5.2)
Sodium: 142 mmol/L (ref 134–144)
Total Protein: 7.2 g/dL (ref 6.0–8.5)
eGFR: 68 mL/min/{1.73_m2} (ref >59)

## 2022-08-09 LAB — CBC
Hematocrit: 40.9 % (ref 34.0–46.6)
Hemoglobin: 13.7 g/dL (ref 11.1–15.9)
MCH: 28.3 pg (ref 26.6–33.0)
MCHC: 33.5 g/dL (ref 31.5–35.7)
MCV: 85 fL (ref 79–97)
Platelets: 197 10*3/uL (ref 150–450)
RBC: 4.84 x10E6/uL (ref 3.77–5.28)
RDW: 13.1 % (ref 11.7–15.4)
WBC: 6.4 10*3/uL (ref 3.4–10.8)

## 2022-08-09 LAB — HEMOGLOBIN A1C
Est. average glucose Bld gHb Est-mCnc: 128 mg/dL
Hgb A1c MFr Bld: 6.1 % — ABNORMAL HIGH (ref 4.8–5.6)

## 2022-08-09 LAB — TSH: TSH: 1.14 u[IU]/mL (ref 0.450–4.500)

## 2022-08-12 ENCOUNTER — Telehealth: Payer: Self-pay

## 2022-08-12 ENCOUNTER — Other Ambulatory Visit: Payer: Self-pay | Admitting: Internal Medicine

## 2022-08-12 NOTE — Telephone Encounter (Signed)
Patient notified & daughter notified. Per provider, patient can stop the calcium supplemnet, recheck bmp in four weeks, and if still elevated we will stop hctz. Appointment scheduled. Patient & daughter aware.

## 2022-08-23 ENCOUNTER — Other Ambulatory Visit: Payer: Self-pay

## 2022-08-28 ENCOUNTER — Other Ambulatory Visit: Payer: Self-pay

## 2022-08-29 ENCOUNTER — Other Ambulatory Visit: Payer: Self-pay

## 2022-08-29 MED ORDER — ROSUVASTATIN CALCIUM 5 MG PO TABS: 5.0000 mg | ORAL_TABLET | Freq: Every day | ORAL | 1 refills | Status: DC

## 2022-09-01 ENCOUNTER — Other Ambulatory Visit: Payer: Self-pay | Admitting: Internal Medicine

## 2022-09-03 ENCOUNTER — Other Ambulatory Visit: Payer: Self-pay

## 2022-09-03 ENCOUNTER — Ambulatory Visit: Payer: Self-pay

## 2022-09-06 ENCOUNTER — Other Ambulatory Visit: Payer: Self-pay

## 2022-09-06 MED ORDER — ROSUVASTATIN CALCIUM 5 MG PO TABS: 5.0000 mg | ORAL_TABLET | Freq: Every day | ORAL | 1 refills | Status: DC

## 2022-09-10 ENCOUNTER — Other Ambulatory Visit: Payer: Medicare HMO

## 2022-09-10 ENCOUNTER — Ambulatory Visit: Payer: Medicare HMO

## 2022-09-10 VITALS — BP 136/70 | HR 60 | Temp 98.5°F | Ht 59.0 in | Wt 196.0 lb

## 2022-09-10 DIAGNOSIS — I129 Hypertensive chronic kidney disease with stage 1 through stage 4 chronic kidney disease, or unspecified chronic kidney disease: Secondary | ICD-10-CM

## 2022-09-10 MED ORDER — LISINOPRIL 20 MG PO TABS: 20.0000 mg | ORAL_TABLET | Freq: Every day | ORAL | 1 refills | Status: DC

## 2022-09-10 NOTE — Patient Instructions (Addendum)
Patient is directed to take 2 lisinopril 10mg  to  equal 20mg . When patient picks up new lisinopril it will be 20mg  and she is directed to take 1 20mg  tablet.

## 2022-09-10 NOTE — Progress Notes (Signed)
Patient presents today for BP check, patient currently taking hydrochlorothiazide 12.5mg , lisinopril 10mg . BP Readings from Last 3 Encounters:  09/10/22 (!) 140/80  08/08/22 118/80  08/08/22 118/80  Per provider - increase lisinopril to 20mg - meds sent.

## 2022-09-11 LAB — PTH, INTACT AND CALCIUM: PTH: 47 pg/mL (ref 15–65)

## 2022-09-11 LAB — BMP8+EGFR
BUN/Creatinine Ratio: 13 (ref 12–28)
BUN: 11 mg/dL (ref 8–27)
CO2: 21 mmol/L (ref 20–29)
Calcium: 10.6 mg/dL — ABNORMAL HIGH (ref 8.7–10.3)
Chloride: 103 mmol/L (ref 96–106)
Creatinine, Ser: 0.86 mg/dL (ref 0.57–1.00)
Glucose: 102 mg/dL — ABNORMAL HIGH (ref 70–99)
Potassium: 4.1 mmol/L (ref 3.5–5.2)
Sodium: 139 mmol/L (ref 134–144)
eGFR: 70 mL/min/{1.73_m2} (ref >59)

## 2022-09-11 LAB — PHOSPHORUS: Phosphorus: 2.5 mg/dL — ABNORMAL LOW (ref 3.0–4.3)

## 2022-09-16 ENCOUNTER — Encounter: Payer: Self-pay | Admitting: Internal Medicine

## 2022-09-27 ENCOUNTER — Telehealth: Payer: Self-pay

## 2022-09-27 NOTE — Telephone Encounter (Signed)
Call to pt reference next PREP class starting on 10/07/22. Spoke with pt and daughter Pedro Earls. Confirmed can start on 10/07/22 MW [id number] at [facility].  Intake scheduled for 11am on 10/02/22 at [facility]. Will meet pt in lobby. Has my number for contact.

## 2022-10-02 NOTE — Progress Notes (Signed)
[facility] PREP Evaluation  Patient Details  Name: Cynthia Barton MRN: [id number] Date of Birth: [birth year] Age: 77 y.o. PCP: Dorothyann Peng, MD  Vitals:   10/02/22 1100  BP: 118/80  Pulse: 77  SpO2: 97%  Weight: 186 lb 9.6 oz (84.6 kg)     [facility] Eval - 10/02/22 1200       [facility] "PREP" Location   [facility] "PREP" Location [name]      Referral    Referring Provider Sanders    Reason for referral Hypertension;High Cholesterol    Program Start Date 10/07/22   MW [id number] x 12 wks     Measurement   Waist Circumference 43 inches    Hip Circumference 48 inches    Body fat --   Unable to calculate? grip strength?     Information for Trainer   Goals get rid of her cane, get into exercise regimen    Current Exercise None    Orthopedic Concerns None    Pertinent Medical History HTN, Chol, s/p cancer    Current Barriers None    Restrictions/Precautions Assistive device;Fall risk    Medications that affect exercise Medication causing dizziness/drowsiness      Timed Up and Go (TUGS)   Timed Up and Go High risk >13 seconds      Mobility and Daily Activities   I find it easy to walk up or down two or more flights of stairs. 3    I have no trouble taking out the trash. 2    I do housework such as vacuuming and dusting on my own without difficulty. 4    I can easily lift a gallon of milk (8lbs). 4    I can easily walk a mile. 2    I have no trouble reaching into high cupboards or reaching down to pick up something from the floor. 4    I do not have trouble doing out-door work such as Loss adjuster, chartered, raking leaves, or gardening. 4      Mobility and Daily Activities   I feel younger than my age. 4    I feel independent. 4    I feel energetic. 4    I live an active life.  3    I feel strong. 4    I feel healthy. 4    I feel active as other people my age. 4      How fit and strong are you.   Fit and Strong Total Score 50            Past Medical History:  Diagnosis Date    Bilateral carpal tunnel syndrome 02/23/2020   Breast cancer (HCC) 09/07/2014   ER+/PR+/Her2- right breast   Breast cancer of lower-outer quadrant of right female breast (HCC) 09/06/2014   Hyperlipidemia    Hypertension    Radiation 11/10/14-12/09/14   Right Breast   Uterine prolapse 2016   Wears dentures    top   Wears glasses    Past Surgical History:  Procedure Laterality Date   BREAST SURGERY  1980   rt br bx-no anesth   COLONOSCOPY  07/27/12   mild diverticulosis   lumpectomy right breast Right 09/30/14   TUBAL LIGATION     Social History   Tobacco Use  Smoking Status Never  Smokeless Tobacco Never   May need to go out of town to care for sister. Will let me know.  If she does, understands she may need to do a  future class instead. Pt agreeable to plan.  Bonnye FavaPam M Janeal Barton 10/02/2022, 12:26 PM

## 2022-10-03 ENCOUNTER — Ambulatory Visit: Payer: Medicare HMO

## 2022-10-03 VITALS — BP 120/70 | HR 60 | Temp 98.5°F | Ht 59.0 in | Wt 186.0 lb

## 2022-10-03 DIAGNOSIS — Z79899 Other long term (current) drug therapy: Secondary | ICD-10-CM

## 2022-10-03 DIAGNOSIS — I129 Hypertensive chronic kidney disease with stage 1 through stage 4 chronic kidney disease, or unspecified chronic kidney disease: Secondary | ICD-10-CM

## 2022-10-03 NOTE — Patient Instructions (Signed)
Hypertension, Adult ?Hypertension is another name for high blood pressure. High blood pressure forces your heart to work harder to pump blood. This can cause problems over time. ?There are two numbers in a blood pressure reading. There is a top number (systolic) over a bottom number (diastolic). It is best to have a blood pressure that is below 120/80. ?What are the causes? ?The cause of this condition is not known. Some other conditions can lead to high blood pressure. ?What increases the risk? ?Some lifestyle factors can make you more likely to develop high blood pressure: ?Smoking. ?Not getting enough exercise or physical activity. ?Being overweight. ?Having too much fat, sugar, calories, or salt (sodium) in your diet. ?Drinking too much alcohol. ?Other risk factors include: ?Having any of these conditions: ?Heart disease. ?Diabetes. ?High cholesterol. ?Kidney disease. ?Obstructive sleep apnea. ?Having a family history of high blood pressure and high cholesterol. ?Age. The risk increases with age. ?Stress. ?What are the signs or symptoms? ?High blood pressure may not cause symptoms. Very high blood pressure (hypertensive crisis) may cause: ?Headache. ?Fast or uneven heartbeats (palpitations). ?Shortness of breath. ?Nosebleed. ?Vomiting or feeling like you may vomit (nauseous). ?Changes in how you see. ?Very bad chest pain. ?Feeling dizzy. ?Seizures. ?How is this treated? ?This condition is treated by making healthy lifestyle changes, such as: ?Eating healthy foods. ?Exercising more. ?Drinking less alcohol. ?Your doctor may prescribe medicine if lifestyle changes do not help enough and if: ?Your top number is above 130. ?Your bottom number is above 80. ?Your personal target blood pressure may vary. ?Follow these instructions at home: ?Eating and drinking ? ?If told, follow the DASH eating plan. To follow this plan: ?Fill one half of your plate at each meal with fruits and vegetables. ?Fill one fourth of your plate  at each meal with whole grains. Whole grains include whole-wheat pasta, brown rice, and whole-grain bread. ?Eat or drink low-fat dairy products, such as skim milk or low-fat yogurt. ?Fill one fourth of your plate at each meal with low-fat (lean) proteins. Low-fat proteins include fish, chicken without skin, eggs, beans, and tofu. ?Avoid fatty meat, cured and processed meat, or chicken with skin. ?Avoid pre-made or processed food. ?Limit the amount of salt in your diet to less than 1,500 mg each day. ?Do not drink alcohol if: ?Your doctor tells you not to drink. ?You are pregnant, may be pregnant, or are planning to become pregnant. ?If you drink alcohol: ?Limit how much you have to: ?0-1 drink a day for women. ?0-2 drinks a day for men. ?Know how much alcohol is in your drink. In the [place] one drink equals one 12 oz bottle of beer (355 mL), one 5 oz glass of wine (148 mL), or one 1? oz glass of hard liquor (44 mL). ?Lifestyle ? ?Work with your doctor to stay at a healthy weight or to lose weight. Ask your doctor what the best weight is for you. ?Get at least 30 minutes of exercise that causes your heart to beat faster (aerobic exercise) most days of the week. This may include walking, swimming, or biking. ?Get at least 30 minutes of exercise that strengthens your muscles (resistance exercise) at least 3 days a week. This may include lifting weights or doing Pilates. ?Do not smoke or use any products that contain nicotine or tobacco. If you need help quitting, ask your doctor. ?Check your blood pressure at home as told by your doctor. ?Keep all follow-up visits. ?Medicines ?Take over-the-counter and prescription medicines   only as told by your doctor. Follow directions carefully. ?Do not skip doses of blood pressure medicine. The medicine does not work as well if you skip doses. Skipping doses also puts you at risk for problems. ?Ask your doctor about side effects or reactions to medicines that you should watch  for. ?Contact a doctor if: ?You think you are having a reaction to the medicine you are taking. ?You have headaches that keep coming back. ?You feel dizzy. ?You have swelling in your ankles. ?You have trouble with your vision. ?Get help right away if: ?You get a very bad headache. ?You start to feel mixed up (confused). ?You feel weak or numb. ?You feel faint. ?You have very bad pain in your: ?Chest. ?Belly (abdomen). ?You vomit more than once. ?You have trouble breathing. ?These symptoms may be an emergency. Get help right away. Call 911. ?Do not wait to see if the symptoms will go away. ?Do not drive yourself to the hospital. ?Summary ?Hypertension is another name for high blood pressure. ?High blood pressure forces your heart to work harder to pump blood. ?For most people, a normal blood pressure is less than 120/80. ?Making healthy choices can help lower blood pressure. If your blood pressure does not get lower with healthy choices, you may need to take medicine. ?This information is not intended to replace advice given to you by your health care provider. Make sure you discuss any questions you have with your health care provider. ?Document Revised: 03/29/2021 Document Reviewed: 03/29/2021 ?Elsevier Patient Education ? 2023 Elsevier Inc. ? ?

## 2022-10-03 NOTE — Progress Notes (Signed)
Patient presents today for a BPC and Labs, patient is currently taking HCTZ 12.5mg , lisinopril 20mg .  BP Readings from Last 3 Encounters:  10/03/22 120/70  10/02/22 118/80  09/10/22 136/70  Per Provider- BP is good, F/u next appt.

## 2022-10-04 ENCOUNTER — Other Ambulatory Visit: Payer: Self-pay | Admitting: Internal Medicine

## 2022-10-04 LAB — BMP8+EGFR
BUN/Creatinine Ratio: 15 (ref 12–28)
BUN: 13 mg/dL (ref 8–27)
CO2: 23 mmol/L (ref 20–29)
Calcium: 11.1 mg/dL — ABNORMAL HIGH (ref 8.7–10.3)
Chloride: 103 mmol/L (ref 96–106)
Creatinine, Ser: 0.89 mg/dL (ref 0.57–1.00)
Glucose: 103 mg/dL — ABNORMAL HIGH (ref 70–99)
Potassium: 4.3 mmol/L (ref 3.5–5.2)
Sodium: 139 mmol/L (ref 134–144)
eGFR: 67 mL/min/{1.73_m2} (ref >59)

## 2022-10-04 MED ORDER — AMLODIPINE BESYLATE 2.5 MG PO TABS: ORAL_TABLET | ORAL | 1 refills | Status: DC

## 2022-10-10 NOTE — Progress Notes (Signed)
[facility] PREP Weekly Session  Patient Details  Name: Cynthia Barton MRN: [id number] Date of Birth: 1945/09/07 Age: 77 y.o. PCP: Dorothyann Peng, MD  There were no vitals filed for this visit.   [facility] Weekly seesion - 10/10/22 1500       [facility] "PREP" Location   [facility] "PREP" Location [name]      Weekly Session   Topic Discussed Goal setting and welcome to the program   fit testing   Classes attended to date 2             [name] 10/10/2022, 3:57 PM

## 2022-10-15 NOTE — Progress Notes (Signed)
[facility] PREP Weekly Session  Patient Details  Name: Cynthia Barton MRN: [id number] Date of Birth: 1946-03-04 Age: 76 y.o. PCP: Dorothyann Peng, MD  Vitals:   10/14/22 1030  Weight: 194 lb 9.6 oz (88.3 kg)     [facility] Weekly seesion - 10/15/22 1700       [facility] "PREP" Location   [facility] "PREP" Location [name]      Weekly Session   Topic Discussed Importance of resistance training;Other ways to be active    Minutes exercised this week 20 minutes   plus walking   Classes attended to date 3             [name] 10/15/2022, 5:23 PM

## 2022-10-22 ENCOUNTER — Ambulatory Visit: Payer: Medicare HMO

## 2022-10-22 VITALS — BP 120/74 | HR 69 | Temp 98.5°F | Ht 59.0 in | Wt 194.0 lb

## 2022-10-22 DIAGNOSIS — I129 Hypertensive chronic kidney disease with stage 1 through stage 4 chronic kidney disease, or unspecified chronic kidney disease: Secondary | ICD-10-CM

## 2022-10-22 NOTE — Progress Notes (Signed)
Patient presents today for a BP check , patient currently taking amLODipine 2.5mg , Lisinopril 20mg .  BP Readings from Last 3 Encounters:  10/22/22 120/74  10/03/22 120/70  10/02/22 118/80  Per Provider- BP is great  f/u at next visit

## 2022-10-23 NOTE — Progress Notes (Signed)
[facility] PREP Weekly Session  Patient Details  Name: Cynthia Barton MRN: [id number] Date of Birth: 03-25-1946 Age: 77 y.o. PCP: Dorothyann Peng, MD  There were no vitals filed for this visit.  No weight recorded by patient.   Patient requests to hold PREP for now. Will be out of town in [place] for a month.  Asked that patient to give me a call when she returns and we can put her in another class.  Spoke with Daughter this am and gave same message.     [facility] Weekly seesion - 10/23/22 0900       [facility] "PREP" Location   [facility] "PREP" Location [name]      Weekly Session   Topic Discussed Healthy eating tips    Classes attended to date 4             [name] 10/23/2022, 9:41 AM

## 2022-11-29 ENCOUNTER — Other Ambulatory Visit: Payer: Self-pay | Admitting: Internal Medicine

## 2022-12-16 ENCOUNTER — Other Ambulatory Visit: Payer: Self-pay | Admitting: Internal Medicine

## 2022-12-19 ENCOUNTER — Encounter: Payer: Self-pay | Admitting: Internal Medicine

## 2022-12-19 ENCOUNTER — Ambulatory Visit (INDEPENDENT_AMBULATORY_CARE_PROVIDER_SITE_OTHER): Payer: Medicare HMO | Admitting: Internal Medicine

## 2022-12-19 VITALS — BP 130/90 | HR 75 | Temp 97.6°F | Ht 59.0 in | Wt 182.6 lb

## 2022-12-19 DIAGNOSIS — Z6836 Body mass index (BMI) 36.0-36.9, adult: Secondary | ICD-10-CM

## 2022-12-19 DIAGNOSIS — N182 Chronic kidney disease, stage 2 (mild): Secondary | ICD-10-CM

## 2022-12-19 DIAGNOSIS — E7800 Pure hypercholesterolemia, unspecified: Secondary | ICD-10-CM | POA: Diagnosis not present

## 2022-12-19 DIAGNOSIS — I129 Hypertensive chronic kidney disease with stage 1 through stage 4 chronic kidney disease, or unspecified chronic kidney disease: Secondary | ICD-10-CM | POA: Diagnosis not present

## 2022-12-19 LAB — CMP14+EGFR
ALT: 27 IU/L (ref 0–32)
AST: 35 IU/L (ref 0–40)
Albumin: 4.1 g/dL (ref 3.8–4.8)
Alkaline Phosphatase: 66 IU/L (ref 44–121)
BUN/Creatinine Ratio: 16 (ref 12–28)
BUN: 12 mg/dL (ref 8–27)
Bilirubin Total: 0.4 mg/dL (ref 0.0–1.2)
CO2: 23 mmol/L (ref 20–29)
Calcium: 10.5 mg/dL — ABNORMAL HIGH (ref 8.7–10.3)
Chloride: 104 mmol/L (ref 96–106)
Creatinine, Ser: 0.77 mg/dL (ref 0.57–1.00)
Globulin, Total: 2.8 g/dL (ref 1.5–4.5)
Glucose: 98 mg/dL (ref 70–99)
Potassium: 4.6 mmol/L (ref 3.5–5.2)
Sodium: 141 mmol/L (ref 134–144)
Total Protein: 6.9 g/dL (ref 6.0–8.5)
eGFR: 79 mL/min/{1.73_m2} (ref >59)

## 2022-12-19 NOTE — Patient Instructions (Addendum)
Stop PRAVASTATIN  Hypertension, Adult Hypertension is another name for high blood pressure. High blood pressure forces your heart to work harder to pump blood. This can cause problems over time. There are two numbers in a blood pressure reading. There is a top number (systolic) over a bottom number (diastolic). It is best to have a blood pressure that is below 120/80. What are the causes? The cause of this condition is not known. Some other conditions can lead to high blood pressure. What increases the risk? Some lifestyle factors can make you more likely to develop high blood pressure: Smoking. Not getting enough exercise or physical activity. Being overweight. Having too much fat, sugar, calories, or salt (sodium) in your diet. Drinking too much alcohol. Other risk factors include: Having any of these conditions: Heart disease. Diabetes. High cholesterol. Kidney disease. Obstructive sleep apnea. Having a family history of high blood pressure and high cholesterol. Age. The risk increases with age. Stress. What are the signs or symptoms? High blood pressure may not cause symptoms. Very high blood pressure (hypertensive crisis) may cause: Headache. Fast or uneven heartbeats (palpitations). Shortness of breath. Nosebleed. Vomiting or feeling like you may vomit (nauseous). Changes in how you see. Very bad chest pain. Feeling dizzy. Seizures. How is this treated? This condition is treated by making healthy lifestyle changes, such as: Eating healthy foods. Exercising more. Drinking less alcohol. Your doctor may prescribe medicine if lifestyle changes do not help enough and if: Your top number is above 130. Your bottom number is above 80. Your personal target blood pressure may vary. Follow these instructions at home: Eating and drinking  If told, follow the DASH eating plan. To follow this plan: Fill one half of your plate at each meal with fruits and vegetables. Fill one  fourth of your plate at each meal with whole grains. Whole grains include whole-wheat pasta, brown rice, and whole-grain bread. Eat or drink low-fat dairy products, such as skim milk or low-fat yogurt. Fill one fourth of your plate at each meal with low-fat (lean) proteins. Low-fat proteins include fish, chicken without skin, eggs, beans, and tofu. Avoid fatty meat, cured and processed meat, or chicken with skin. Avoid pre-made or processed food. Limit the amount of salt in your diet to less than 1,500 mg each day. Do not drink alcohol if: Your doctor tells you not to drink. You are pregnant, may be pregnant, or are planning to become pregnant. If you drink alcohol: Limit how much you have to: 0-1 drink a day for women. 0-2 drinks a day for men. Know how much alcohol is in your drink. In the [place] one drink equals one 12 oz bottle of beer (355 mL), one 5 oz glass of wine (148 mL), or one 1 oz glass of hard liquor (44 mL). Lifestyle  Work with your doctor to stay at a healthy weight or to lose weight. Ask your doctor what the best weight is for you. Get at least 30 minutes of exercise that causes your heart to beat faster (aerobic exercise) most days of the week. This may include walking, swimming, or biking. Get at least 30 minutes of exercise that strengthens your muscles (resistance exercise) at least 3 days a week. This may include lifting weights or doing Pilates. Do not smoke or use any products that contain nicotine or tobacco. If you need help quitting, ask your doctor. Check your blood pressure at home as told by your doctor. Keep all follow-up visits. Medicines Take over-the-counter  and prescription medicines only as told by your doctor. Follow directions carefully. Do not skip doses of blood pressure medicine. The medicine does not work as well if you skip doses. Skipping doses also puts you at risk for problems. Ask your doctor about side effects or reactions to medicines that  you should watch for. Contact a doctor if: You think you are having a reaction to the medicine you are taking. You have headaches that keep coming back. You feel dizzy. You have swelling in your ankles. You have trouble with your vision. Get help right away if: You get a very bad headache. You start to feel mixed up (confused). You feel weak or numb. You feel faint. You have very bad pain in your: Chest. Belly (abdomen). You vomit more than once. You have trouble breathing. These symptoms may be an emergency. Get help right away. Call 911. Do not wait to see if the symptoms will go away. Do not drive yourself to the hospital. Summary Hypertension is another name for high blood pressure. High blood pressure forces your heart to work harder to pump blood. For most people, a normal blood pressure is less than 120/80. Making healthy choices can help lower blood pressure. If your blood pressure does not get lower with healthy choices, you may need to take medicine. This information is not intended to replace advice given to you by your health care provider. Make sure you discuss any questions you have with your health care provider. Document Revised: 03/29/2021 Document Reviewed: 03/29/2021 Elsevier Patient Education  2024 [employer].

## 2022-12-19 NOTE — Assessment & Plan Note (Signed)
Unfortunately, she has been taking both rosuvastatin and pravastatin. Advised to STOP pravastatin. I will check lipid panel at her physical in August.

## 2022-12-19 NOTE — Assessment & Plan Note (Signed)
She was congratulated on her 12lb weight loss since April. Admits she has been stressed caring for her sister.

## 2022-12-19 NOTE — Assessment & Plan Note (Signed)
Chronic, fair control.   She will c/w lisinopril 20mg  and amlodipine 2.5mg  daily for now. Advised to take lisinopril 20mg  in AM and amlodipine 2.5mg  nightly. Reminded to follow low sodium diet.

## 2022-12-19 NOTE — Assessment & Plan Note (Signed)
I will check repeat calcium level today. She is now off of hydrochlorothiazide.

## 2022-12-19 NOTE — Progress Notes (Signed)
I,Victoria T Deloria Lair, CMA,acting as a Neurosurgeon for Gwynneth Aliment, MD.,have documented all relevant documentation on the behalf of Gwynneth Aliment, MD,as directed by  Gwynneth Aliment, MD while in the presence of Gwynneth Aliment, MD.  Subjective:  Patient ID: Cynthia Barton , female    DOB: 1945-10-27 , 77 y.o.   MRN: [id number]  Chief Complaint  Patient presents with   Hypertension    HPI  Patient presents today for a blood pressure f/u. She reports compliance with meds. She denies having any headaches, chest pain and shortness of breath. Patient reports her sister just moved in with her recently and she is still adjusting to that. Pt is also taking 2 chol meds she wanted to make sure she is supposed to be taking both.  Hypertension This is a chronic problem. The current episode started more than 1 year ago. The problem has been gradually improving since onset. The problem is controlled. Pertinent negatives include no blurred vision. Risk factors for coronary artery disease include dyslipidemia, post-menopausal state, obesity and sedentary lifestyle. The current treatment provides moderate improvement. Compliance problems include exercise.  Hypertensive end-organ damage includes kidney disease.     Past Medical History:  Diagnosis Date   Bilateral carpal tunnel syndrome 02/23/2020   Breast cancer (HCC) 09/07/2014   ER+/PR+/Her2- right breast   Breast cancer of lower-outer quadrant of right female breast (HCC) 09/06/2014   Hyperlipidemia    Hypertension    Radiation 11/10/14-12/09/14   Right Breast   Uterine prolapse 2016   Wears dentures    top   Wears glasses      Family History  Problem Relation Age of Onset   Heart disease Father    Melanoma Mother    Kidney failure Sister 50   Heart disease Maternal Uncle      Current Outpatient Medications:    amLODipine (NORVASC) 2.5 MG tablet, Take one tablet by mouth daily., Disp: 90 tablet, Rfl: 1   Ascorbic Acid (VITAMIN C) 1000 MG tablet,  Take 1 tablet (1,000 mg total) by mouth daily., Disp: , Rfl:    Black Elderberry,Berry-Flower, 575 MG CAPS, Take 1 capsule by mouth daily., Disp: , Rfl:    Cholecalciferol (VITAMIN D3) 125 MCG (5000 UT) CAPS, Take by mouth., Disp: , Rfl:    lisinopril (ZESTRIL) 20 MG tablet, Take 1 tablet (20 mg total) by mouth daily., Disp: 90 tablet, Rfl: 1   Multiple Vitamins-Minerals (MULTIVITAMIN WITH MINERALS) tablet, Take 1 tablet by mouth daily., Disp: , Rfl:    pravastatin (PRAVACHOL) 40 MG tablet, Take 1 tablet (40 mg total) by mouth daily., Disp: 90 tablet, Rfl: 2   rosuvastatin (CRESTOR) 5 MG tablet, Take 1 tablet (5 mg total) by mouth daily., Disp: 90 tablet, Rfl: 1   No Known Allergies   Review of Systems  Constitutional: Negative.   Eyes: Negative.  Negative for blurred vision.  Respiratory: Negative.    Cardiovascular: Negative.   Gastrointestinal: Negative.   Musculoskeletal: Negative.   Skin: Negative.   Neurological: Negative.   Psychiatric/Behavioral: Negative.       Today's Vitals   12/19/22 0929  BP: (!) 130/90  Pulse: 75  Temp: 97.6 F (36.4 C)  Weight: 182 lb 9.6 oz (82.8 kg)  Height: 4\' 11"  (1.499 m)  PainSc: 0-No pain   Body mass index is 36.88 kg/m.  Wt Readings from Last 3 Encounters:  12/19/22 182 lb 9.6 oz (82.8 kg)  10/22/22 194 lb (88 kg)  10/14/22  194 lb 9.6 oz (88.3 kg)     Objective:  Physical Exam Vitals and nursing note reviewed.  Constitutional:      Appearance: Normal appearance.  HENT:     Head: Normocephalic and atraumatic.     Nose:     Comments: Masked     Mouth/Throat:     Comments: Masked  Eyes:     Extraocular Movements: Extraocular movements intact.  Cardiovascular:     Rate and Rhythm: Normal rate and regular rhythm.     Heart sounds: Normal heart sounds.  Pulmonary:     Effort: Pulmonary effort is normal.     Breath sounds: Normal breath sounds.  Musculoskeletal:     Cervical back: Normal range of motion.  Skin:    General:  Skin is warm.  Neurological:     General: No focal deficit present.     Mental Status: She is alert.  Psychiatric:        Mood and Affect: Mood normal.        Behavior: Behavior normal.         Assessment And Plan:  Benign hypertension with CKD (chronic kidney disease), stage II Assessment & Plan: Chronic, fair control.   She will c/w lisinopril 20mg  and amlodipine 2.5mg  daily for now. Advised to take lisinopril 20mg  in AM and amlodipine 2.5mg  nightly. Reminded to follow low sodium diet.   Orders: -     CMP14+EGFR  Pure hypercholesterolemia Assessment & Plan: Unfortunately, she has been taking both rosuvastatin and pravastatin. Advised to STOP pravastatin. I will check lipid panel at her physical in August.    Hypercalcemia Assessment & Plan: I will check repeat calcium level today. She is now off of hydrochlorothiazide.    Class 2 severe obesity due to excess calories with serious comorbidity and body mass index (BMI) of 36.0 to 36.9 in adult [facility]) Assessment & Plan: She was congratulated on her 12lb weight loss since April. Admits she has been stressed caring for her sister.       Return if symptoms worsen or fail to improve.  Patient was given opportunity to ask questions. Patient verbalized understanding of the plan and was able to repeat key elements of the plan. All questions were answered to their satisfaction.    I, Gwynneth Aliment, MD, have reviewed all documentation for this visit. The documentation on 12/19/22 for the exam, diagnosis, procedures, and orders are all accurate and complete.   IF YOU HAVE BEEN REFERRED TO A SPECIALIST, IT MAY TAKE 1-2 WEEKS TO SCHEDULE/PROCESS THE REFERRAL. IF YOU HAVE NOT HEARD FROM US/SPECIALIST IN TWO WEEKS, PLEASE GIVE [place] A CALL AT [phone].   THE PATIENT IS ENCOURAGED TO PRACTICE SOCIAL DISTANCING DUE TO THE COVID-19 PANDEMIC.

## 2022-12-22 ENCOUNTER — Other Ambulatory Visit: Payer: Self-pay | Admitting: Internal Medicine

## 2023-01-07 ENCOUNTER — Other Ambulatory Visit: Payer: Self-pay | Admitting: Internal Medicine

## 2023-01-08 ENCOUNTER — Other Ambulatory Visit: Payer: Self-pay

## 2023-01-08 DIAGNOSIS — I129 Hypertensive chronic kidney disease with stage 1 through stage 4 chronic kidney disease, or unspecified chronic kidney disease: Secondary | ICD-10-CM

## 2023-01-08 MED ORDER — LISINOPRIL 20 MG PO TABS
20.0000 mg | ORAL_TABLET | Freq: Every day | ORAL | 1 refills | Status: DC
Start: 2023-01-08 — End: 2023-10-07

## 2023-01-09 DIAGNOSIS — H40013 Open angle with borderline findings, low risk, bilateral: Secondary | ICD-10-CM | POA: Diagnosis not present

## 2023-01-09 DIAGNOSIS — H25813 Combined forms of age-related cataract, bilateral: Secondary | ICD-10-CM | POA: Diagnosis not present

## 2023-02-17 DIAGNOSIS — Z1231 Encounter for screening mammogram for malignant neoplasm of breast: Secondary | ICD-10-CM | POA: Diagnosis not present

## 2023-02-17 LAB — HM MAMMOGRAPHY

## 2023-02-18 NOTE — Patient Instructions (Signed)

## 2023-02-19 ENCOUNTER — Ambulatory Visit (INDEPENDENT_AMBULATORY_CARE_PROVIDER_SITE_OTHER): Payer: Medicare HMO | Admitting: Internal Medicine

## 2023-02-19 ENCOUNTER — Encounter: Payer: Self-pay | Admitting: Internal Medicine

## 2023-02-19 VITALS — BP 124/82 | HR 66 | Temp 97.7°F | Ht 59.0 in | Wt 177.0 lb

## 2023-02-19 DIAGNOSIS — Z853 Personal history of malignant neoplasm of breast: Secondary | ICD-10-CM

## 2023-02-19 DIAGNOSIS — E2839 Other primary ovarian failure: Secondary | ICD-10-CM

## 2023-02-19 DIAGNOSIS — Z23 Encounter for immunization: Secondary | ICD-10-CM

## 2023-02-19 DIAGNOSIS — Z0000 Encounter for general adult medical examination without abnormal findings: Secondary | ICD-10-CM | POA: Diagnosis not present

## 2023-02-19 DIAGNOSIS — I129 Hypertensive chronic kidney disease with stage 1 through stage 4 chronic kidney disease, or unspecified chronic kidney disease: Secondary | ICD-10-CM | POA: Diagnosis not present

## 2023-02-19 DIAGNOSIS — R7309 Other abnormal glucose: Secondary | ICD-10-CM | POA: Diagnosis not present

## 2023-02-19 DIAGNOSIS — E7800 Pure hypercholesterolemia, unspecified: Secondary | ICD-10-CM | POA: Diagnosis not present

## 2023-02-19 DIAGNOSIS — Z6835 Body mass index (BMI) 35.0-35.9, adult: Secondary | ICD-10-CM

## 2023-02-19 DIAGNOSIS — N182 Chronic kidney disease, stage 2 (mild): Secondary | ICD-10-CM | POA: Diagnosis not present

## 2023-02-19 DIAGNOSIS — Z638 Other specified problems related to primary support group: Secondary | ICD-10-CM

## 2023-02-19 LAB — POCT URINALYSIS DIPSTICK
Bilirubin, UA: NEGATIVE
Blood, UA: NEGATIVE
Glucose, UA: NEGATIVE
Ketones, UA: NEGATIVE
Nitrite, UA: NEGATIVE
Protein, UA: NEGATIVE
Spec Grav, UA: 1.02 (ref 1.010–1.025)
Urobilinogen, UA: 0.2 E.U./dL
pH, UA: 5.5 (ref 5.0–8.0)

## 2023-02-19 NOTE — Progress Notes (Unsigned)
I,Victoria T Deloria Lair, CMA,acting as a Neurosurgeon for Gwynneth Aliment, MD.,have documented all relevant documentation on the behalf of Gwynneth Aliment, MD,as directed by  Gwynneth Aliment, MD while in the presence of Gwynneth Aliment, MD.  Subjective:  Patient ID: Cynthia Barton , female    DOB: [birth year] , 77 y.o.   MRN: [id number]  Chief Complaint  Patient presents with  . Annual Exam  . Hypertension  . Hyperlipidemia    HPI  She presents today for HM. She reports compliance with medications.  She denies headaches, chest pain and shortness of breath. She is no longer followed by GYN.   She admits that she often forgets to take her Amlodipine at night. She questions as to why she takes Lisinopril & Amlodipine. She has not had any issues with either medication.   Hypertension This is a chronic problem. The current episode started more than 1 year ago. The problem has been gradually improving since onset. The problem is controlled. Pertinent negatives include no blurred vision or palpitations. Risk factors for coronary artery disease include dyslipidemia, post-menopausal state, obesity and sedentary lifestyle. The current treatment provides moderate improvement. Compliance problems include exercise.  Hypertensive end-organ damage includes kidney disease.     Past Medical History:  Diagnosis Date  . Bilateral carpal tunnel syndrome 02/23/2020  . Breast cancer (HCC) 09/07/2014   ER+/PR+/Her2- right breast  . Breast cancer of lower-outer quadrant of right female breast (HCC) 09/06/2014  . Hyperlipidemia   . Hypertension   . Radiation 11/10/14-12/09/14   Right Breast  . Uterine prolapse 2016  . Wears dentures    top  . Wears glasses      Family History  Problem Relation Age of Onset  . Heart disease Father   . Melanoma Mother   . Kidney failure Sister 35  . Heart disease Maternal Uncle      Current Outpatient Medications:  .  amLODipine (NORVASC) 2.5 MG tablet, Take one tablet by mouth  daily., Disp: 90 tablet, Rfl: 1 .  Ascorbic Acid (VITAMIN C) 1000 MG tablet, Take 1 tablet (1,000 mg total) by mouth daily., Disp: , Rfl:  .  Black Elderberry,Berry-Flower, 575 MG CAPS, Take 1 capsule by mouth daily., Disp: , Rfl:  .  Cholecalciferol (VITAMIN D3) 125 MCG (5000 UT) CAPS, Take by mouth., Disp: , Rfl:  .  lisinopril (ZESTRIL) 20 MG tablet, Take 1 tablet (20 mg total) by mouth daily., Disp: 90 tablet, Rfl: 1 .  Multiple Vitamins-Minerals (MULTIVITAMIN WITH MINERALS) tablet, Take 1 tablet by mouth daily., Disp: , Rfl:  .  rosuvastatin (CRESTOR) 5 MG tablet, Take 1 tablet (5 mg total) by mouth daily., Disp: 90 tablet, Rfl: 1 .  pravastatin (PRAVACHOL) 40 MG tablet, Take 1 tablet (40 mg total) by mouth daily. (Patient not taking: Reported on 02/19/2023), Disp: 90 tablet, Rfl: 2   No Known Allergies   Review of Systems  Constitutional: Negative.   HENT: Negative.    Eyes: Negative.  Negative for blurred vision.  Respiratory: Negative.    Cardiovascular: Negative.  Negative for palpitations.  Gastrointestinal: Negative.   Endocrine: Negative.   Genitourinary: Negative.   Musculoskeletal: Negative.   Skin: Negative.   Allergic/Immunologic: Negative.   Neurological: Negative.   Hematological: Negative.   Psychiatric/Behavioral: Negative.       Today's Vitals   02/19/23 0940  BP: 100/80  Pulse: 66  Temp: 97.7 F (36.5 C)  SpO2: 98%  Weight: 177 lb (80.3 kg)  Height: 4\' 11"  (1.499 m)   Body mass index is 35.75 kg/m.  Wt Readings from Last 3 Encounters:  02/19/23 177 lb (80.3 kg)  12/19/22 182 lb 9.6 oz (82.8 kg)  10/22/22 194 lb (88 kg)     Objective:  Physical Exam Vitals and nursing note reviewed. Exam conducted with a chaperone present.  Constitutional:      Appearance: Normal appearance. She is obese.  HENT:     Head: Normocephalic and atraumatic.     Right Ear: Tympanic membrane, ear canal and external ear normal.     Left Ear: Tympanic membrane, ear  canal and external ear normal.     Nose: Nose normal.     Mouth/Throat:     Mouth: Mucous membranes are moist.     Pharynx: Oropharynx is clear.  Eyes:     Extraocular Movements: Extraocular movements intact.     Conjunctiva/sclera: Conjunctivae normal.     Pupils: Pupils are equal, round, and reactive to light.  Cardiovascular:     Rate and Rhythm: Normal rate and regular rhythm.     Pulses: Normal pulses.     Heart sounds: Normal heart sounds.  Pulmonary:     Effort: Pulmonary effort is normal.     Breath sounds: Normal breath sounds.  Chest:  Breasts:    Tanner Score is 5.     Left: Normal.     Comments: Healed surgical scar on right Abdominal:     General: Bowel sounds are normal.     Palpations: Abdomen is soft.     Hernia: There is no hernia in the left inguinal area or right inguinal area.     Comments: Obese, soft  Genitourinary:    Exam position: Lithotomy position.     Tanner stage (genital): 5.     Cervix: Normal.     Comments: deferred Musculoskeletal:        General: Normal range of motion.     Cervical back: Normal range of motion and neck supple.  Lymphadenopathy:     Lower Body: No right inguinal adenopathy.  Skin:    General: Skin is warm and dry.  Neurological:     General: No focal deficit present.     Mental Status: She is alert and oriented to person, place, and time.  Psychiatric:        Mood and Affect: Mood normal.        Behavior: Behavior normal.        Assessment And Plan:  Encounter for annual health examination  Benign hypertension with CKD (chronic kidney disease), stage II -     CBC -     Lipid panel -     POCT urinalysis dipstick -     Microalbumin / creatinine urine ratio -     EKG 12-Lead -     BMP8+eGFR  Pure hypercholesterolemia -     Lipid panel  Hypercalcemia -     BMP8+eGFR  Other abnormal glucose -     Hemoglobin A1c  Class 2 severe obesity due to excess calories with serious comorbidity and body mass index  (BMI) of 35.0 to 35.9 in adult [facility])  Immunization due -     Flu Vaccine Trivalent High Dose (Fluad)  She is encouraged to strive for BMI less than 30 to decrease cardiac risk. Advised to aim for at least 150 minutes of exercise per week.    Return for 1 year HM, 6 month bpc..  Patient was given  opportunity to ask questions. Patient verbalized understanding of the plan and was able to repeat key elements of the plan. All questions were answered to their satisfaction.    I, Gwynneth Aliment, MD, have reviewed all documentation for this visit. The documentation on 02/19/23 for the exam, diagnosis, procedures, and orders are all accurate and complete.   IF YOU HAVE BEEN REFERRED TO A SPECIALIST, IT MAY TAKE 1-2 WEEKS TO SCHEDULE/PROCESS THE REFERRAL. IF YOU HAVE NOT HEARD FROM US/SPECIALIST IN TWO WEEKS, PLEASE GIVE [place] A CALL AT [phone].   THE PATIENT IS ENCOURAGED TO PRACTICE SOCIAL DISTANCING DUE TO THE COVID-19 PANDEMIC.

## 2023-02-20 ENCOUNTER — Other Ambulatory Visit: Payer: Self-pay | Admitting: Internal Medicine

## 2023-02-20 LAB — HEMOGLOBIN A1C
Est. average glucose Bld gHb Est-mCnc: 128 mg/dL
Hgb A1c MFr Bld: 6.1 % — ABNORMAL HIGH (ref 4.8–5.6)

## 2023-02-20 LAB — LIPID PANEL
Chol/HDL Ratio: 2.7 ratio (ref 0.0–4.4)
Cholesterol, Total: 151 mg/dL (ref 100–199)
HDL: 56 mg/dL (ref >39)
LDL Chol Calc (NIH): 81 mg/dL (ref 0–99)
Triglycerides: 73 mg/dL (ref 0–149)
VLDL Cholesterol Cal: 14 mg/dL (ref 5–40)

## 2023-02-20 LAB — BMP8+EGFR
BUN/Creatinine Ratio: 13 (ref 12–28)
BUN: 10 mg/dL (ref 8–27)
CO2: 23 mmol/L (ref 20–29)
Calcium: 10.9 mg/dL — ABNORMAL HIGH (ref 8.7–10.3)
Chloride: 103 mmol/L (ref 96–106)
Creatinine, Ser: 0.8 mg/dL (ref 0.57–1.00)
Glucose: 95 mg/dL (ref 70–99)
Potassium: 4.4 mmol/L (ref 3.5–5.2)
Sodium: 140 mmol/L (ref 134–144)
eGFR: 76 mL/min/{1.73_m2} (ref >59)

## 2023-02-20 LAB — CBC
Hematocrit: 43.4 % (ref 34.0–46.6)
Hemoglobin: 13.9 g/dL (ref 11.1–15.9)
MCH: 28 pg (ref 26.6–33.0)
MCHC: 32 g/dL (ref 31.5–35.7)
MCV: 88 fL (ref 79–97)
Platelets: 181 10*3/uL (ref 150–450)
RBC: 4.96 x10E6/uL (ref 3.77–5.28)
RDW: 13.3 % (ref 11.7–15.4)
WBC: 5.2 10*3/uL (ref 3.4–10.8)

## 2023-02-20 LAB — MICROALBUMIN / CREATININE URINE RATIO
Creatinine, Urine: 71.4 mg/dL
Microalb/Creat Ratio: 5 mg/g{creat} (ref 0–29)
Microalbumin, Urine: 3.3 ug/mL

## 2023-02-21 ENCOUNTER — Encounter: Payer: Self-pay | Admitting: Internal Medicine

## 2023-02-22 LAB — SPECIMEN STATUS REPORT

## 2023-02-22 LAB — VITAMIN D 25 HYDROXY (VIT D DEFICIENCY, FRACTURES): Vit D, 25-Hydroxy: 78 ng/mL (ref 30.0–100.0)

## 2023-03-02 DIAGNOSIS — Z0000 Encounter for general adult medical examination without abnormal findings: Secondary | ICD-10-CM | POA: Insufficient documentation

## 2023-03-02 NOTE — Assessment & Plan Note (Signed)
Chronic, fair control. Goal BP<120/80.  She will c/w lisinopril 20mg  and amlodipine 2.5mg  daily for now. Advised to take lisinopril 20mg  in AM and amlodipine 2.5mg  nightly. Reminded to follow low sodium diet.

## 2023-03-02 NOTE — Assessment & Plan Note (Signed)

## 2023-03-02 NOTE — Assessment & Plan Note (Signed)
Chronic, currently taking rosuvastatin 5mg  daily. Encouraged to follow a heart healthy lifestyle.

## 2023-03-02 NOTE — Assessment & Plan Note (Signed)
I will check BMP today to check calcium level. She is no longer on diuretic therapy.

## 2023-03-02 NOTE — Assessment & Plan Note (Signed)
She is encouraged to strive for BMI less than 30 to decrease cardiac risk. Advised to aim for at least 150 minutes of exercise per week.  

## 2023-03-02 NOTE — Assessment & Plan Note (Signed)
Previous labs reviewed, her A1c has been elevated in the past. I will check an A1c today. Reminded to avoid refined sugars including sugary drinks/foods and processed meats including bacon, sausages and deli meats.  

## 2023-03-07 DIAGNOSIS — Z853 Personal history of malignant neoplasm of breast: Secondary | ICD-10-CM | POA: Diagnosis not present

## 2023-03-07 DIAGNOSIS — M8588 Other specified disorders of bone density and structure, other site: Secondary | ICD-10-CM | POA: Diagnosis not present

## 2023-03-31 ENCOUNTER — Ambulatory Visit: Payer: Medicare HMO | Admitting: Internal Medicine

## 2023-03-31 ENCOUNTER — Encounter: Payer: Self-pay | Admitting: Internal Medicine

## 2023-03-31 VITALS — BP 110/78 | HR 90 | Temp 97.8°F | Ht 59.0 in | Wt 169.8 lb

## 2023-03-31 DIAGNOSIS — E66811 Obesity, class 1: Secondary | ICD-10-CM | POA: Insufficient documentation

## 2023-03-31 DIAGNOSIS — I129 Hypertensive chronic kidney disease with stage 1 through stage 4 chronic kidney disease, or unspecified chronic kidney disease: Secondary | ICD-10-CM | POA: Diagnosis not present

## 2023-03-31 DIAGNOSIS — Z638 Other specified problems related to primary support group: Secondary | ICD-10-CM | POA: Diagnosis not present

## 2023-03-31 DIAGNOSIS — N182 Chronic kidney disease, stage 2 (mild): Secondary | ICD-10-CM

## 2023-03-31 NOTE — Assessment & Plan Note (Signed)
Chronic, fair control. Goal BP<120/80.  She will c/w lisinopril 20mg  and amlodipine 2.5mg  daily for now. Advised to take lisinopril 20mg  in AM and amlodipine 2.5mg  nightly. Reminded to follow low sodium diet. She is also encouraged to stay well hydrated.

## 2023-03-31 NOTE — Progress Notes (Signed)
I,Cynthia Barton, CMA,acting as a Neurosurgeon for Cynthia Aliment, MD.,have documented all relevant documentation on the behalf of Cynthia Aliment, MD,as directed by  Cynthia Aliment, MD while in the presence of Cynthia Aliment, MD.  Subjective:  Patient ID: Cynthia Barton , female    DOB: Jan 11, 1946 , 77 y.o.   MRN: [id number]  Chief Complaint  Patient presents with   Family Problem    HPI  She presents today wanting to discuss conflict between her & her daughter, Cynthia Barton. She reports this issue has been ongoing since the last time she was here in August.  She states her daughter is upset about her. Her daughter reports since 2008 she has not been happy about her life. She is now regretting moving here to help care for her Mom (the patient) when she had cancer. . .  She is interested in therapy.      Past Medical History:  Diagnosis Date   Bilateral carpal tunnel syndrome 02/23/2020   Breast cancer (HCC) 09/07/2014   ER+/PR+/Her2- right breast   Breast cancer of lower-outer quadrant of right female breast (HCC) 09/06/2014   Hyperlipidemia    Hypertension    Radiation 11/10/14-12/09/14   Right Breast   Uterine prolapse 2016   Wears dentures    top   Wears glasses      Family History  Problem Relation Age of Onset   Heart disease Father    Melanoma Mother    Kidney failure Sister 55   Heart disease Maternal Uncle      Current Outpatient Medications:    amLODipine (NORVASC) 2.5 MG tablet, Take one tablet by mouth daily., Disp: 90 tablet, Rfl: 1   Ascorbic Acid (VITAMIN C) 1000 MG tablet, Take 1 tablet (1,000 mg total) by mouth daily., Disp: , Rfl:    Black Elderberry,Berry-Flower, 575 MG CAPS, Take 1 capsule by mouth daily., Disp: , Rfl:    Cholecalciferol (VITAMIN D3) 125 MCG (5000 UT) CAPS, Take by mouth., Disp: , Rfl:    lisinopril (ZESTRIL) 20 MG tablet, Take 1 tablet (20 mg total) by mouth daily., Disp: 90 tablet, Rfl: 1   Multiple Vitamins-Minerals (MULTIVITAMIN WITH MINERALS)  tablet, Take 1 tablet by mouth daily., Disp: , Rfl:    rosuvastatin (CRESTOR) 5 MG tablet, Take 1 tablet (5 mg total) by mouth daily., Disp: 90 tablet, Rfl: 1   No Known Allergies   Review of Systems  Constitutional: Negative.   Respiratory: Negative.    Cardiovascular: Negative.   Neurological: Negative.   Psychiatric/Behavioral: Negative.       Today's Vitals   03/31/23 1200  BP: 110/78  Pulse: 90  Temp: 97.8 F (36.6 C)  SpO2: 98%  Weight: 169 lb 12.8 oz (77 kg)  Height: 4\' 11"  (1.499 m)   Body mass index is 34.3 kg/m.  Wt Readings from Last 3 Encounters:  03/31/23 169 lb 12.8 oz (77 kg)  02/19/23 177 lb (80.3 kg)  12/19/22 182 lb 9.6 oz (82.8 kg)     Objective:  Physical Exam Vitals and nursing note reviewed.  Constitutional:      Appearance: Normal appearance. She is obese.  HENT:     Head: Normocephalic and atraumatic.  Eyes:     Extraocular Movements: Extraocular movements intact.  Cardiovascular:     Rate and Rhythm: Normal rate and regular rhythm.     Heart sounds: Normal heart sounds.  Pulmonary:     Effort: Pulmonary effort is normal.  Breath sounds: Normal breath sounds.  Musculoskeletal:     Cervical back: Normal range of motion.  Skin:    General: Skin is warm.  Neurological:     General: No focal deficit present.     Mental Status: She is alert.  Psychiatric:        Mood and Affect: Mood normal.        Behavior: Behavior normal.         Assessment And Plan:  Family conflict Assessment & Plan: She is encouraged to consider therapy. She is in agreement. It appears the conflict is actually between her daughter and her son, which is now affecting the relationship with her daughter. I will place referral to Psychology for therapy.   Orders: -     Ambulatory referral to Psychology  Benign hypertension with CKD (chronic kidney disease), stage II Assessment & Plan: Chronic, fair control. Goal BP<120/80.  She will c/w lisinopril 20mg  and  amlodipine 2.5mg  daily for now. Advised to take lisinopril 20mg  in AM and amlodipine 2.5mg  nightly. Reminded to follow low sodium diet. She is also encouraged to stay well hydrated.    She is encouraged to strive for BMI less than 30 to decrease cardiac risk. Advised to aim for at least 150 minutes of exercise per week.    Return if symptoms worsen or fail to improve.  Patient was given opportunity to ask questions. Patient verbalized understanding of the plan and was able to repeat key elements of the plan. All questions were answered to their satisfaction.    I, Cynthia Aliment, MD, have reviewed all documentation for this visit. The documentation on 03/31/23 for the exam, diagnosis, procedures, and orders are all accurate and complete.   IF YOU HAVE BEEN REFERRED TO A SPECIALIST, IT MAY TAKE 1-2 WEEKS TO SCHEDULE/PROCESS THE REFERRAL. IF YOU HAVE NOT HEARD FROM US/SPECIALIST IN TWO WEEKS, PLEASE GIVE [place] A CALL AT [phone].   THE PATIENT IS ENCOURAGED TO PRACTICE SOCIAL DISTANCING DUE TO THE COVID-19 PANDEMIC.

## 2023-03-31 NOTE — Patient Instructions (Signed)

## 2023-03-31 NOTE — Assessment & Plan Note (Signed)
She is encouraged to consider therapy. She is in agreement. It appears the conflict is actually between her daughter and her son, which is now affecting the relationship with her daughter. I will place referral to Psychology for therapy.

## 2023-04-27 ENCOUNTER — Other Ambulatory Visit: Payer: Self-pay | Admitting: Internal Medicine

## 2023-04-28 ENCOUNTER — Other Ambulatory Visit: Payer: Self-pay | Admitting: Internal Medicine

## 2023-05-05 ENCOUNTER — Other Ambulatory Visit: Payer: Self-pay | Admitting: Internal Medicine

## 2023-05-07 ENCOUNTER — Encounter: Payer: Self-pay | Admitting: Gastroenterology

## 2023-05-16 ENCOUNTER — Encounter: Payer: Self-pay | Admitting: Gastroenterology

## 2023-07-30 ENCOUNTER — Other Ambulatory Visit (HOSPITAL_COMMUNITY)
Admission: RE | Admit: 2023-07-30 | Discharge: 2023-07-30 | Disposition: A | Discharge status: Home / Self Care | Payer: Medicare HMO | Source: Ambulatory Visit | Attending: Internal Medicine | Admitting: Internal Medicine

## 2023-07-30 ENCOUNTER — Encounter: Payer: Self-pay | Admitting: Internal Medicine

## 2023-07-30 ENCOUNTER — Ambulatory Visit: Payer: Medicare HMO | Admitting: Internal Medicine

## 2023-07-30 ENCOUNTER — Ambulatory Visit: Payer: Self-pay | Admitting: Internal Medicine

## 2023-07-30 VITALS — BP 122/80 | HR 72 | Temp 98.1°F | Ht 59.0 in | Wt 179.6 lb

## 2023-07-30 DIAGNOSIS — Z01411 Encounter for gynecological examination (general) (routine) with abnormal findings: Secondary | ICD-10-CM | POA: Insufficient documentation

## 2023-07-30 DIAGNOSIS — N8189 Other female genital prolapse: Secondary | ICD-10-CM | POA: Diagnosis not present

## 2023-07-30 DIAGNOSIS — Z008 Encounter for other general examination: Secondary | ICD-10-CM

## 2023-07-30 DIAGNOSIS — E785 Hyperlipidemia, unspecified: Secondary | ICD-10-CM | POA: Diagnosis not present

## 2023-07-30 DIAGNOSIS — Z79899 Other long term (current) drug therapy: Secondary | ICD-10-CM | POA: Diagnosis not present

## 2023-07-30 DIAGNOSIS — R350 Frequency of micturition: Secondary | ICD-10-CM

## 2023-07-30 DIAGNOSIS — N950 Postmenopausal bleeding: Secondary | ICD-10-CM | POA: Insufficient documentation

## 2023-07-30 DIAGNOSIS — E66812 Obesity, class 2: Secondary | ICD-10-CM | POA: Diagnosis not present

## 2023-07-30 DIAGNOSIS — Z6836 Body mass index (BMI) 36.0-36.9, adult: Secondary | ICD-10-CM

## 2023-07-30 DIAGNOSIS — Z1151 Encounter for screening for human papillomavirus (HPV): Secondary | ICD-10-CM | POA: Diagnosis not present

## 2023-07-30 LAB — HEMOCCULT GUIAC POC 1CARD (OFFICE): Fecal Occult Blood, POC: NEGATIVE

## 2023-07-30 LAB — POCT URINALYSIS DIPSTICK
Bilirubin, UA: NEGATIVE
Blood, UA: NEGATIVE
Glucose, UA: NEGATIVE
Ketones, UA: NEGATIVE
Leukocytes, UA: NEGATIVE
Nitrite, UA: NEGATIVE
Protein, UA: NEGATIVE
Spec Grav, UA: 1.02 (ref 1.010–1.025)
Urobilinogen, UA: 0.2 U/dL
pH, UA: 6 (ref 5.0–8.0)

## 2023-07-30 NOTE — Patient Instructions (Signed)
 Pelvic Organ Prolapse Pelvic organ prolapse is a condition in women that involves the stretching, bulging, or dropping of pelvic organs into an abnormal position, past the opening of the vagina. It happens when the muscles and tissues that surround and support pelvic structures become weak or stretched. Pelvic organ prolapse can involve the: Vagina (vaginal prolapse). Uterus (uterine prolapse). Bladder (cystocele). Rectum (rectocele). Intestines (enterocele). When organs other than the vagina are involved, they often bulge into the vagina or protrude from the vagina, depending on how severe the prolapse is. What are the causes? This condition may be caused by: Pregnancy, labor, and childbirth. Past pelvic surgery. Lower levels of the hormone estrogen due to menopause. Consistently lifting more than 50 lb (23 kg). Obesity. Long-term difficulty passing stool (chronic constipation). Long-term, or chronic, cough. Fluid buildup in the abdomen due to certain conditions. What are the signs or symptoms? Symptoms of this condition include: Leaking a little urine (loss of bladder control) when you cough, sneeze, strain, and exercise (stress incontinence). This may be worse immediately after childbirth. It may gradually improve over time. Feeling pressure in your pelvis or vagina. This pressure may increase when you cough or when you are passing stool. A bulge that protrudes from the opening of your vagina. Difficulty passing urine or stool. Pain in your lower back. Pain or discomfort during sex, or decreased interest in sex. Repeated bladder infections (urinary tract infections). Difficulty inserting a tampon. In some people, this condition causes no symptoms. How is this diagnosed? This condition may be diagnosed based on a vaginal and rectal exam. During the exam, you may be asked to cough and strain while you are lying down, sitting, and standing up. Your health care provider will determine if  other tests are required, such as bladder function tests. How is this treated? Treatment for this condition may depend on your symptoms. Treatment may include: Lifestyle changes, such as drinking plenty of fluids and eating foods that are high in fiber. Emptying your bladder at scheduled times (bladder training therapy). This can help reduce or avoid urinary incontinence. Estrogen. This may help mild prolapse by increasing the strength and tone of pelvic floor muscles. Kegel exercises. These may help mild cases of prolapse by strengthening and tightening the muscles of the pelvic floor. A soft, flexible device that helps support the vaginal walls and keep pelvic organs in place (pessary). This is inserted into your vagina by your health care provider. Surgery. This is often the only form of treatment for severe prolapse. Follow these instructions at home: Eating and drinking Avoid drinking beverages that contain caffeine or alcohol. Increase your intake of high-fiber foods to decrease constipation and straining during bowel movements. Activity Lose weight if recommended by your health care provider. Avoid heavy lifting and straining with exercise and work. Do not hold your breath when you perform mild to moderate lifting and exercise activities. Limit your activities as directed by your health care provider. Do Kegel exercises as directed by your health care provider. To do this: Squeeze your pelvic floor muscles tight. You should feel a tight lift in your rectal area and a tightness in your vaginal area. Keep your stomach, buttocks, and legs relaxed. Hold the muscles tight for up to 10 seconds. Then relax your muscles. Repeat this exercise 50 times a day, or as much as told by your health care provider. Continue to do this exercise for at least 4-6 weeks, or for as long as told by your health care provider.  General instructions Take over-the-counter and prescription medicines only as told by  your health care provider. Wear a sanitary pad or adult diapers if you have urinary incontinence. If you have a pessary, take care of it as told by your health care provider. Keep all follow-up visits. This is important. Contact a health care provider if you: Have symptoms that interfere with your daily activities or sex life. Need medicine to help with the discomfort. Notice bleeding from your vagina that is not related to your menstrual period. Have a fever. Have pain or bleeding when you urinate. Have bleeding when you pass stool. Pass urine when you have sex. Have chronic constipation. Have a pessary that falls out. Have a foul-smelling vaginal discharge. Have an unusual, low pain in your abdomen. Get help right away if you: Cannot pass urine. Summary Pelvic organ prolapse is the stretching, bulging, or dropping of pelvic organs into an abnormal position. It happens when the muscles and tissues that surround and support pelvic structures become weak or stretched. When organs other than the vagina are involved, they often bulge into the vagina or protrude from it, depending on how severe the prolapse is. In most cases, this condition needs to be treated only if it produces symptoms. Treatment may include lifestyle changes, estrogen, Kegel exercises, pessary insertion, or surgery. Avoid heavy lifting and straining with exercise and work. Do not hold your breath when you perform mild to moderate lifting and exercise activities. Limit your activities as directed by your health care provider. This information is not intended to replace advice given to you by your health care provider. Make sure you discuss any questions you have with your health care provider. Document Revised: 12/06/2019 Document Reviewed: 12/06/2019 Elsevier Patient Education  2024 [employer]  Abnormal Uterine Bleeding Abnormal uterine bleeding means bleeding more than normal from your womb (uterus). It can  include: Bleeding after sex. Bleeding between monthly (menstrual) periods. Bleeding that is heavier than normal. Monthly periods that last longer than normal. Bleeding after you have stopped having your monthly period (menopause). You should see a doctor for any kind of bleeding that is not normal. Treatment depends on the cause of your bleeding and how much you bleed. Follow these instructions at home: Medicines Take over-the-counter and prescription medicines only as told by your doctor. Ask your doctor about: Taking medicines such as aspirin and ibuprofen . Do not take these medicines unless your doctor tells you to take them. Taking over-the-counter medicines, vitamins, herbs, and supplements. You may be given iron pills. Take them as told by your doctor. Managing constipation If you take iron pills, you may need to take these actions to prevent or treat trouble pooping (constipation): Drink enough fluid to keep your pee (urine) pale yellow. Take over-the-counter or prescription medicines. Eat foods that are high in fiber. These include beans, whole grains, and fresh fruits and vegetables. Limit foods that are high in fat and sugar. These include fried or sweet foods. Activity Change your activity to decrease bleeding if you need to change your sanitary pad more than one time every 2 hours: Lie in bed with your feet raised (elevated). Place a cold pack on your lower belly. Rest as much as you are able until the bleeding stops or slows down. General instructions Do not use tampons, douche, or have sex until your doctor says these things are okay. Change your pads often. Get regular exams. These include: Pelvic exams. Screenings for cancer of the cervix. It is up  to you to get the results of any tests that are done. Ask how to get your results when they are ready. Watch for any changes in your bleeding. For 2 months, write down: When your monthly period starts. When your monthly  period ends. When you get any abnormal bleeding from your vagina. What problems you notice. Keep all follow-up visits. Contact a doctor if: The bleeding lasts more than one week. You feel dizzy at times. You feel like you may vomit (nausea). You vomit. You feel light-headed or weak. Your symptoms get worse. Get help right away if: You faint. You have to change pads every hour. You have pain in your belly. You have a fever or chills. You get sweaty or weak. You pass large blood clots from your vagina. These symptoms may be an emergency. Get help right away. Call your local emergency services (911 in the [place] Do not wait to see if the symptoms will go away. Do not drive yourself to the hospital. Summary Abnormal uterine bleeding means bleeding more than normal from your womb (uterus). Any kind of bleeding that is not normal should be checked by a doctor. Treatment depends on the cause of your bleeding and how much you bleed. Get help right away if you faint, you have to change pads every hour, or you pass large blood clots from your vagina. This information is not intended to replace advice given to you by your health care provider. Make sure you discuss any questions you have with your health care provider. Document Revised: 10/10/2020 Document Reviewed: 10/10/2020 Elsevier Patient Education  2024 [employer].

## 2023-07-30 NOTE — Progress Notes (Signed)
 I,[name], CMA,acting as a neurosurgeon for [name], MD.,have documented all relevant documentation on the behalf of [name], MD,as directed by  [name], MD while in the presence of [name], MD.  Subjective:  Patient ID: Cynthia Barton , female    DOB: 02-15-1946 , 78 y.o.   MRN: [id number]  Chief Complaint  Patient presents with   Vaginal Bleeding   Urinary Frequency    HPI  Patient presents today for vaginal bleeding & frequent urination. She states she noticed blood from her vagina last Friday. No associated pelvic pain.  Denies having similar sx in the past.    Also with urinary frequency.  Not currently sexually active.  Denies dysuria.  No fever/chills.     Hypertension This is a chronic problem. The current episode started more than 1 year ago. The problem has been gradually improving since onset. The problem is controlled. Pertinent negatives include no blurred vision. Risk factors for coronary artery disease include dyslipidemia, post-menopausal state, obesity and sedentary lifestyle. The current treatment provides moderate improvement. Compliance problems include exercise.  Hypertensive end-organ damage includes kidney disease.     Past Medical History:  Diagnosis Date   Bilateral carpal tunnel syndrome 02/23/2020   Breast cancer (HCC) 09/07/2014   ER+/PR+/Her2- right breast   Breast cancer of lower-outer quadrant of right female breast (HCC) 09/06/2014   Hyperlipidemia    Hypertension    Radiation 11/10/14-12/09/14   Right Breast   Uterine prolapse 2016   Wears dentures    top   Wears glasses      Family History  Problem Relation Age of Onset   Heart disease Father    Melanoma Mother    Kidney failure Sister 42   Heart disease Maternal Uncle      Current Outpatient Medications:    amLODipine  (NORVASC ) 2.5 MG tablet, Take 1 tablet by mouth once daily, Disp: 90 tablet, Rfl: 2   Ascorbic Acid (VITAMIN C ) 1000 MG tablet, Take 1 tablet  (1,000 mg total) by mouth daily., Disp: , Rfl:    Black Elderberry,Berry-Flower, 575 MG CAPS, Take 1 capsule by mouth daily., Disp: , Rfl:    Cholecalciferol (VITAMIN D3) 125 MCG (5000 UT) CAPS, Take by mouth., Disp: , Rfl:    lisinopril  (ZESTRIL ) 20 MG tablet, Take 1 tablet (20 mg total) by mouth daily., Disp: 90 tablet, Rfl: 1   Multiple Vitamins-Minerals (MULTIVITAMIN WITH MINERALS) tablet, Take 1 tablet by mouth daily., Disp: , Rfl:    rosuvastatin  (CRESTOR ) 5 MG tablet, TAKE 1 TABLET EVERY DAY, Disp: 90 tablet, Rfl: 3   No Known Allergies   Review of Systems  Constitutional: Negative.   Eyes:  Negative for blurred vision.  Respiratory: Negative.    Cardiovascular: Negative.   Gastrointestinal: Negative.   Genitourinary:  Positive for frequency and vaginal bleeding.       She c/o postmenopausal bleeding. Sx occurred on Friday.   Neurological: Negative.   Psychiatric/Behavioral: Negative.       Today's Vitals   07/30/23 1127  BP: 122/80  Pulse: 72  Temp: 98.1 F (36.7 C)  SpO2: 98%  Weight: 179 lb 9.6 oz (81.5 kg)  Height: 4' 11 (1.499 m)   Body mass index is 36.27 kg/m.  Wt Readings from Last 3 Encounters:  07/30/23 179 lb 9.6 oz (81.5 kg)  03/31/23 169 lb 12.8 oz (77 kg)  02/19/23 177 lb (80.3 kg)     Objective:  Physical Exam  Vitals and nursing note reviewed. Exam conducted with a chaperone present.  Constitutional:      Appearance: Normal appearance.  HENT:     Head: Normocephalic and atraumatic.  Cardiovascular:     Rate and Rhythm: Normal rate and regular rhythm.     Heart sounds: Normal heart sounds.  Pulmonary:     Effort: Pulmonary effort is normal.     Breath sounds: Normal breath sounds.  Abdominal:     General: Bowel sounds are normal.     Palpations: Abdomen is soft.     Comments: Obese.   Genitourinary:    Exam position: Supine.     Tanner stage (genital): 5.     Labia:        Right: No rash.        Left: No rash.      Vagina: Normal.      Cervix: Normal.     Uterus: With uterine prolapse.      Rectum: Guaiac result negative. No mass or tenderness.  Musculoskeletal:     Cervical back: Normal range of motion.  Lymphadenopathy:     Lower Body: No right inguinal adenopathy. No left inguinal adenopathy.  Skin:    General: Skin is warm.  Neurological:     General: No focal deficit present.     Mental Status: She is alert.  Psychiatric:        Mood and Affect: Mood normal.        Behavior: Behavior normal.         Assessment And Plan:  Postmenopausal bleeding Assessment & Plan: Pap smear performed, with chaperone present.  I will refer her to GYN for further evaluation. I will also schedule her for pelvic/TV ultrasound. She is in agreement with treatment plan.    Orders: -     Cytology - PAP -     CBC -     TSH -     Ambulatory referral to Urogynecology -     US  PELVIC COMPLETE WITH TRANSVAGINAL; Future  Other female genital prolapse Assessment & Plan: She agrees to Uro-Gyn referral for further evaluation.   Orders: -     Ambulatory referral to Urogynecology  Frequent urination -     POCT urinalysis dipstick  Class 2 severe obesity due to excess calories with serious comorbidity and body mass index (BMI) of 36.0 to 36.9 in adult [facility]) Assessment & Plan: She is encouraged to strive for BMI less than 30 to decrease cardiac risk. Advised to aim for at least 150 minutes of exercise per week.    Rectal exam -     POCT occult blood stool  She is encouraged to strive for BMI less than 30 to decrease cardiac risk. Advised to aim for at least 150 minutes of exercise per week.    Return if symptoms worsen or fail to improve.  Patient was given opportunity to ask questions. Patient verbalized understanding of the plan and was able to repeat key elements of the plan. All questions were answered to their satisfaction.    I, [name], MD, have reviewed all documentation for this visit. The documentation on  07/30/23 for the exam, diagnosis, procedures, and orders are all accurate and complete.   IF YOU HAVE BEEN REFERRED TO A SPECIALIST, IT MAY TAKE 1-2 WEEKS TO SCHEDULE/PROCESS THE REFERRAL. IF YOU HAVE NOT HEARD FROM US /SPECIALIST IN TWO WEEKS, PLEASE GIVE US  A CALL AT [phone].   THE PATIENT IS ENCOURAGED  TO PRACTICE SOCIAL DISTANCING DUE TO THE COVID-19 PANDEMIC.

## 2023-07-30 NOTE — Assessment & Plan Note (Signed)
 She is encouraged to strive for BMI less than 30 to decrease cardiac risk. Advised to aim for at least 150 minutes of exercise per week.

## 2023-07-30 NOTE — Assessment & Plan Note (Signed)
 Pap smear performed, with chaperone present.  I will refer her to GYN for further evaluation. I will also schedule her for pelvic/TV ultrasound. She is in agreement with treatment plan.

## 2023-07-30 NOTE — Assessment & Plan Note (Signed)
 She agrees to Uro-Gyn referral for further evaluation.

## 2023-07-31 LAB — CBC
Hematocrit: 39 % (ref 34.0–46.6)
Hemoglobin: 12.5 g/dL (ref 11.1–15.9)
MCH: 28.5 pg (ref 26.6–33.0)
MCHC: 32.1 g/dL (ref 31.5–35.7)
MCV: 89 fL (ref 79–97)
Platelets: 170 10*3/uL (ref 150–450)
RBC: 4.39 x10E6/uL (ref 3.77–5.28)
RDW: 13.1 % (ref 11.7–15.4)
WBC: 5.8 10*3/uL (ref 3.4–10.8)

## 2023-07-31 LAB — TSH: TSH: 0.908 u[IU]/mL (ref 0.450–4.500)

## 2023-08-06 LAB — CYTOLOGY - PAP
Comment: NEGATIVE
Diagnosis: NEGATIVE
High risk HPV: NEGATIVE

## 2023-08-12 ENCOUNTER — Ambulatory Visit (HOSPITAL_COMMUNITY)
Admission: RE | Admit: 2023-08-12 | Discharge: 2023-08-12 | Disposition: A | Discharge status: Home / Self Care | Payer: Medicare HMO | Source: Ambulatory Visit | Attending: Internal Medicine | Admitting: Internal Medicine

## 2023-08-12 DIAGNOSIS — N950 Postmenopausal bleeding: Secondary | ICD-10-CM | POA: Diagnosis not present

## 2023-08-12 DIAGNOSIS — N888 Other specified noninflammatory disorders of cervix uteri: Secondary | ICD-10-CM | POA: Diagnosis not present

## 2023-08-12 DIAGNOSIS — D259 Leiomyoma of uterus, unspecified: Secondary | ICD-10-CM | POA: Diagnosis not present

## 2023-08-13 ENCOUNTER — Encounter: Payer: Self-pay | Admitting: Gastroenterology

## 2023-08-13 ENCOUNTER — Ambulatory Visit: Payer: Medicare HMO | Admitting: Gastroenterology

## 2023-08-13 VITALS — BP 110/76 | HR 68 | Ht 59.0 in | Wt 176.0 lb

## 2023-08-13 DIAGNOSIS — Z1211 Encounter for screening for malignant neoplasm of colon: Secondary | ICD-10-CM

## 2023-08-13 DIAGNOSIS — N939 Abnormal uterine and vaginal bleeding, unspecified: Secondary | ICD-10-CM | POA: Diagnosis not present

## 2023-08-13 DIAGNOSIS — N814 Uterovaginal prolapse, unspecified: Secondary | ICD-10-CM | POA: Diagnosis not present

## 2023-08-13 MED ORDER — SUTAB 1479-225-188 MG PO TABS: ORAL_TABLET | ORAL | 0 refills | Status: DC

## 2023-08-13 NOTE — Progress Notes (Unsigned)
 HPI : Cynthia Barton is a 78 y.o. female with a history of breast cancer, hypertension and hyperlipidemia who is referred to [place] by Dorothyann Peng, MD for consideration of further colon cancer screening.  She had a colonoscopy by Dr. Jarold Motto in 2013 which showed mild diverticulosis in the left colon      Past Medical History:  Diagnosis Date   Bilateral carpal tunnel syndrome 02/23/2020   Breast cancer (HCC) 09/07/2014   ER+/PR+/Her2- right breast   Breast cancer of lower-outer quadrant of right female breast (HCC) 09/06/2014   Hyperlipidemia    Hypertension    Radiation 11/10/14-12/09/14   Right Breast   Uterine prolapse 2016   Wears dentures    top   Wears glasses      Past Surgical History:  Procedure Laterality Date   BREAST SURGERY  1980   rt br bx-no anesth   COLONOSCOPY  07/27/12   mild diverticulosis   lumpectomy right breast Right 09/30/14   TUBAL LIGATION     Family History  Problem Relation Age of Onset   Heart disease Father    Melanoma Mother    Kidney failure Sister 1   Heart disease Maternal Uncle    Social History   Tobacco Use   Smoking status: Never   Smokeless tobacco: Never  Vaping Use   Vaping status: Never Used  Substance Use Topics   Alcohol use: Not Currently    Alcohol/week: 0.0 standard drinks of alcohol   Drug use: No   Current Outpatient Medications  Medication Sig Dispense Refill   amLODipine (NORVASC) 2.5 MG tablet Take 1 tablet by mouth once daily 90 tablet 2   Ascorbic Acid (VITAMIN C) 1000 MG tablet Take 1 tablet (1,000 mg total) by mouth daily.     Black Elderberry,Berry-Flower, 575 MG CAPS Take 1 capsule by mouth daily.     Cholecalciferol (VITAMIN D3) 125 MCG (5000 UT) CAPS Take by mouth.     lisinopril (ZESTRIL) 20 MG tablet Take 1 tablet (20 mg total) by mouth daily. 90 tablet 1   Multiple Vitamins-Minerals (MULTIVITAMIN WITH MINERALS) tablet Take 1 tablet by mouth daily.     rosuvastatin (CRESTOR) 5 MG tablet TAKE 1 TABLET  EVERY DAY 90 tablet 3   No current facility-administered medications for this visit.   No Known Allergies   Review of Systems: All systems reviewed and negative except where noted in HPI.    No results found.  Physical Exam: There were no vitals taken for this visit. Constitutional: Pleasant,well-developed, ***female in no acute distress. HEENT: Normocephalic and atraumatic. Conjunctivae are normal. No scleral icterus. Neck supple.  Cardiovascular: Normal rate, regular rhythm.  Pulmonary/chest: Effort normal and breath sounds normal. No wheezing, rales or rhonchi. Abdominal: Soft, nondistended, nontender. Bowel sounds active throughout. There are no masses palpable. No hepatomegaly. Extremities: no edema Lymphadenopathy: No cervical adenopathy noted. Neurological: Alert and oriented to person place and time. Skin: Skin is warm and dry. No rashes noted. Psychiatric: Normal mood and affect. Behavior is normal.  CBC    Component Value Date/Time   WBC 5.8 07/30/2023 1254   WBC 6.6 04/02/2017 1310   RBC 4.39 07/30/2023 1254   RBC 4.79 04/02/2017 1310   HGB 12.5 07/30/2023 1254   HGB 13.2 12/22/2014 1343   HCT 39.0 07/30/2023 1254   HCT 41.1 12/22/2014 1343   PLT 170 07/30/2023 1254   MCV 89 07/30/2023 1254   MCV 86.5 12/22/2014 1343   MCH 28.5 07/30/2023 1254  MCH 29.2 04/02/2017 1310   MCHC 32.1 07/30/2023 1254   MCHC 34.3 04/02/2017 1310   RDW 13.1 07/30/2023 1254   RDW 13.9 12/22/2014 1343   LYMPHSABS 1.7 04/02/2017 1310   LYMPHSABS 0.9 12/22/2014 1343   MONOABS 0.4 04/02/2017 1310   MONOABS 0.7 12/22/2014 1343   EOSABS 0.0 04/02/2017 1310   EOSABS 0.0 12/22/2014 1343   BASOSABS 0.0 04/02/2017 1310   BASOSABS 0.0 12/22/2014 1343    CMP     Component Value Date/Time   NA 140 02/19/2023 1046   NA 140 12/22/2014 1344   K 4.4 02/19/2023 1046   K 3.9 12/22/2014 1344   CL 103 02/19/2023 1046   CO2 23 02/19/2023 1046   CO2 25 12/22/2014 1344   GLUCOSE 95  02/19/2023 1046   GLUCOSE 115 (H) 04/02/2017 1310   GLUCOSE 103 12/22/2014 1344   BUN 10 02/19/2023 1046   BUN 9.6 12/22/2014 1344   CREATININE 0.80 02/19/2023 1046   CREATININE 0.8 12/22/2014 1344   CALCIUM 10.9 (H) 02/19/2023 1046   CALCIUM 10.1 12/22/2014 1344   PROT 6.9 12/19/2022 1019   PROT 6.8 12/22/2014 1344   ALBUMIN 4.1 12/19/2022 1019   ALBUMIN 3.4 (L) 12/22/2014 1344   AST 35 12/19/2022 1019   AST 25 12/22/2014 1344   ALT 27 12/19/2022 1019   ALT 21 12/22/2014 1344   ALKPHOS 66 12/19/2022 1019   ALKPHOS 56 12/22/2014 1344   BILITOT 0.4 12/19/2022 1019   BILITOT 0.75 12/22/2014 1344   GFRNONAA 66 07/20/2020 1123   GFRAA 76 07/20/2020 1123       Latest Ref Rng & Units 07/30/2023   12:54 PM 02/19/2023   10:46 AM 08/08/2022   10:21 AM  CBC EXTENDED  WBC 3.4 - 10.8 x10E3/uL 5.8  5.2  6.4   RBC 3.77 - 5.28 x10E6/uL 4.39  4.96  4.84   Hemoglobin 11.1 - 15.9 g/dL 16.1  09.6  04.5   HCT 34.0 - 46.6 % 39.0  43.4  40.9   Platelets 150 - 450 x10E3/uL 170  181  197       ASSESSMENT AND PLAN:  Dorothyann Peng, MD

## 2023-08-13 NOTE — Patient Instructions (Addendum)
 You have been scheduled for a colonoscopy. Please follow written instructions given to you at your visit today.   If you use inhalers (even only as needed), please bring them with you on the day of your procedure.  DO NOT TAKE 7 DAYS PRIOR TO TEST- Trulicity (dulaglutide) Ozempic, Wegovy (semaglutide) Mounjaro (tirzepatide) Bydureon Bcise (exanatide extended release)  DO NOT TAKE 1 DAY PRIOR TO YOUR TEST Rybelsus (semaglutide) Adlyxin (lixisenatide) Victoza (liraglutide) Byetta (exanatide) ___________________________________________________________________________  Bonita Quin will receive your bowel preparation through [facility], which ensures the lowest copay and home delivery, with outreach via text or call from an [phone] number. Please respond promptly to avoid rescheduling of your procedure. If you are interested in alternative options or have any questions regarding your prep, please contact them at [phone] ____________________________________________________________________________  Your Provider Has Sent Your Bowel Prep Regimen To [facility]   [facility] will contact you to verify your information and collect your copay, if applicable. Enjoy the comfort of your home while your prescription is mailed to you, FREE of any shipping charges.   [facility] accepts all major insurance benefits and applies discounts & coupons.  Have additional questions?   Chat: [email or web address] Call: [phone] Email: [email or web address] NCPDP: [id number]  How will [facility] contact you?  With a Welcome phone call,  a Welcome text and a checkout link in text form.  Texts you receive from [phone] Are NOT Spam.  *To set up delivery, you must complete the checkout process via link or speak to one of the patient care representatives. If [facility] is unable to reach you, your prescription may be delayed.  To avoid long hold times on the phone, you may also utilize the secure chat  feature on the [facility] website to request that they call you back for transaction completion or to expedite your concerns.  _______________________________________________________  If your blood pressure at your visit was 140/90 or greater, please contact your primary care physician to follow up on this.  _______________________________________________________  If you are age 9 or older, your body mass index should be between 23-30. Your Body mass index is 35.55 kg/m. If this is out of the aforementioned range listed, please consider follow up with your Primary Care Provider.  If you are age 54 or younger, your body mass index should be between 19-25. Your Body mass index is 35.55 kg/m. If this is out of the aformentioned range listed, please consider follow up with your Primary Care Provider.   ________________________________________________________  The [facility] providers would like to encourage you to use [facility] to communicate with providers for non-urgent requests or questions.  Due to long hold times on the telephone, sending your provider a message by [facility] may be a faster and more efficient way to get a response.  Please allow 48 business hours for a response.  Please remember that this is for non-urgent requests.  _______________________________________________________  It was a pleasure to see you today!  Thank you for trusting me with your gastrointestinal care!    Scott E.Tomasa Rand, MD

## 2023-08-25 ENCOUNTER — Ambulatory Visit: Payer: Medicare HMO | Admitting: Internal Medicine

## 2023-08-27 ENCOUNTER — Ambulatory Visit: Payer: Medicare HMO | Admitting: Internal Medicine

## 2023-09-03 ENCOUNTER — Ambulatory Visit

## 2023-09-03 DIAGNOSIS — Z0000 Encounter for general adult medical examination without abnormal findings: Secondary | ICD-10-CM | POA: Diagnosis not present

## 2023-09-03 NOTE — Patient Instructions (Signed)
 Ms. Gartrell , Thank you for taking time to come for your Medicare Wellness Visit. I appreciate your ongoing commitment to your health goals. Please review the following plan we discussed and let me know if I can assist you in the future.   Referrals/Orders/Follow-Ups/Clinician Recommendations: none  This is a list of the screening recommended for you and due dates:  Health Maintenance  Topic Date Due   COVID-19 Vaccine (5 - 2024-25 season) 02/23/2023   DTaP/Tdap/Td vaccine (2 - Td or Tdap) 02/01/2024   Medicare Annual Wellness Visit  09/02/2024   Pneumonia Vaccine  Completed   Flu Shot  Completed   DEXA scan (bone density measurement)  Completed   Hepatitis C Screening  Completed   Zoster (Shingles) Vaccine  Completed   HPV Vaccine  Aged Out   Colon Cancer Screening  Discontinued    Advanced directives: (Copy Requested) Please bring a copy of your health care power of attorney and living will to the office to be added to your chart at your convenience. You can mail to [facility] [address] [place], [place] [address] or email to [email or web address]  Next Medicare Annual Wellness Visit scheduled for next year: No, office will schedule  insert Preventive Care attachment Insert FALL PREVENTION attachment if needed

## 2023-09-03 NOTE — Progress Notes (Signed)
 Subjective:   Raquel Lasorsa is a 78 y.o. who presents for a Medicare Wellness preventive visit.  Visit Complete: Virtual I connected with  Blanche Loyd on 09/03/23 by a audio enabled telemedicine application and verified that I am speaking with the correct person using two identifiers.  Patient Location: Home  Provider Location: Office/Clinic  I discussed the limitations of evaluation and management by telemedicine. The patient expressed understanding and agreed to proceed.  Vital Signs: Because this visit was a virtual/telehealth visit, some criteria may be missing or patient reported. Any vitals not documented were not able to be obtained and vitals that have been documented are patient reported.  VideoError- Librarian, academic were attempted between this provider and patient, however failed, due to patient having technical difficulties OR patient did not have access to video capability.  We continued and completed visit with audio only.   AWV Questionnaire: No: Patient Medicare AWV questionnaire was not completed prior to this visit.  Cardiac Risk Factors include: advanced age (>33men, >66 women);hypertension     Objective:    Today's Vitals   There is no height or weight on file to calculate BMI.     09/03/2023    9:44 AM 08/08/2022    9:18 AM 08/11/2021    5:58 PM 07/26/2021    8:42 AM 07/20/2020    9:15 AM 07/14/2019    8:54 AM 07/09/2018    9:41 AM  Advanced Directives  Does Patient Have a Medical Advance Directive? Yes Yes Yes Yes Yes No No  Type of Estate agent of [place];Living will Healthcare Power of [place];Living will Healthcare Power of [place];Living will Healthcare Power of [place];Living will Healthcare Power of [place];Living will    Does patient want to make changes to medical advance directive?   No - Patient declined      Copy of Healthcare Power of Attorney in Chart? No - copy requested No - copy requested  No - copy requested No - copy requested No - copy requested    Would patient like information on creating a medical advance directive?      Yes (MAU/Ambulatory/Procedural Areas - Information given) No - Patient declined    Current Medications (verified) Outpatient Encounter Medications as of 09/03/2023  Medication Sig   amLODipine (NORVASC) 2.5 MG tablet Take 1 tablet by mouth once daily   Ascorbic Acid (VITAMIN C) 1000 MG tablet Take 1 tablet (1,000 mg total) by mouth daily.   Black Elderberry,Berry-Flower, 575 MG CAPS Take 1 capsule by mouth daily.   Cholecalciferol (VITAMIN D3) 125 MCG (5000 UT) CAPS Take by mouth.   lisinopril (ZESTRIL) 20 MG tablet Take 1 tablet (20 mg total) by mouth daily.   Multiple Vitamins-Minerals (MULTIVITAMIN WITH MINERALS) tablet Take 1 tablet by mouth daily.   rosuvastatin (CRESTOR) 5 MG tablet TAKE 1 TABLET EVERY DAY   Sodium Sulfate-Mag Sulfate-KCl (SUTAB) [phone] MG TABS Use as directed for colonoscopy. MANUFACTURER CODES!! BIN: [id number] PCN: CN GROUP: [id number] MEMBER ID: [id number];[employer]HQ AS SECONDARY INSURANCE ;NO PRIOR AUTHORIZATION (Patient not taking: Reported on 09/03/2023)   No facility-administered encounter medications on file as of 09/03/2023.    Allergies (verified) Patient has no known allergies.   History: Past Medical History:  Diagnosis Date   Bilateral carpal tunnel syndrome 02/23/2020   Breast cancer (HCC) 09/07/2014   ER+/PR+/Her2- right breast   Breast cancer of lower-outer quadrant of right female breast (HCC) 09/06/2014   Hyperlipidemia    Hypertension  Radiation 11/10/14-12/09/14   Right Breast   Uterine prolapse 2016   Wears dentures    top   Wears glasses    Past Surgical History:  Procedure Laterality Date   BREAST SURGERY  1980   rt br bx-no anesth   COLONOSCOPY  07/27/12   mild diverticulosis   lumpectomy right breast Right 09/30/14   TUBAL LIGATION     Family History  Problem Relation Age of Onset   Heart disease  Father    Melanoma Mother    Kidney failure Sister 38   Heart disease Maternal Uncle    Social History   Socioeconomic History   Marital status: Divorced    Spouse name: Not on file   Number of children: Not on file   Years of education: Not on file   Highest education level: Not on file  Occupational History   Occupation: retired  Tobacco Use   Smoking status: Never   Smokeless tobacco: Never  Vaping Use   Vaping status: Never Used  Substance and Sexual Activity   Alcohol use: Not Currently    Alcohol/week: 0.0 standard drinks of alcohol   Drug use: No   Sexual activity: Not Currently    Comment: 1st intercourse 78 yo-Fewer than 5 partners  Other Topics Concern   Not on file  Social History Narrative   Not on file   Social Drivers of Health   Financial Resource Strain: Low Risk  (09/03/2023)   Overall Financial Resource Strain (CARDIA)    Difficulty of Paying Living Expenses: Not hard at all  Food Insecurity: No Food Insecurity (09/03/2023)   Hunger Vital Sign    Worried About Running Out of Food in the Last Year: Never true    Ran Out of Food in the Last Year: Never true  Transportation Needs: No Transportation Needs (09/03/2023)   PRAPARE - Administrator, Civil Service (Medical): No    Lack of Transportation (Non-Medical): No  Physical Activity: Inactive (09/03/2023)   Exercise Vital Sign    Days of Exercise per Week: 0 days    Minutes of Exercise per Session: 0 min  Stress: No Stress Concern Present (09/03/2023)   [employer] of Occupational Health - Occupational Stress Questionnaire    Feeling of Stress : Not at all  Social Connections: Moderately Isolated (09/03/2023)   Social Connection and Isolation Panel [NHANES]    Frequency of Communication with Friends and Family: More than three times a week    Frequency of Social Gatherings with Friends and Family: More than three times a week    Attends Religious Services: More than 4 times per year     Active Member of [employer] or Organizations: No    Attends Engineer, structural: Never    Marital Status: Divorced    Tobacco Counseling Counseling given: Not Answered    Clinical Intake:  Pre-visit preparation completed: Yes  Pain : No/denies pain     Nutritional Risks: None Diabetes: No  How often do you need to have someone help you when you read instructions, pamphlets, or other written materials from your doctor or pharmacy?: 1 - Never  Interpreter Needed?: No  Information entered by :: NAllen LPN   Activities of Daily Living     09/03/2023    9:38 AM  In your present state of health, do you have any difficulty performing the following activities:  Hearing? 1  Comment has hearing aid  Vision? 0  Difficulty concentrating or  making decisions? 0  Walking or climbing stairs? 0  Dressing or bathing? 0  Doing errands, shopping? 0  Preparing Food and eating ? N  Using the Toilet? N  In the past six months, have you accidently leaked urine? Y  Comment wears depends  Do you have problems with loss of bowel control? N  Managing your Medications? N  Managing your Finances? N  Housekeeping or managing your Housekeeping? N    Patient Care Team: Dorothyann Peng, MD as PCP - General (Internal Medicine) Griselda Miner, MD as Consulting Physician (General Surgery) Serena Croissant, MD as Consulting Physician (Hematology and Oncology) Lurline Hare, MD as Consulting Physician (Radiation Oncology) Donnelly Angelica, RN as Registered Nurse Pershing Proud, RN as Registered Nurse Hubbard Hartshorn, NP (Inactive) as Nurse Practitioner (Nurse Practitioner) Salomon Fick, NP as Nurse Practitioner (Hematology and Oncology) Caudill, Maryjane Hurter, [facility] (Inactive) as Pharmacist (Pharmacist)  Indicate any recent Medical Services you may have received from other than [facility] providers in the past year (date may be approximate).     Assessment:   This is a routine  wellness examination for Henreitta.  Hearing/Vision screen Hearing Screening - Comments:: Has hearing aids that are maintained Vision Screening - Comments:: Regular eye exams, [facility]   Goals Addressed             This Visit's Progress    Patient Stated       09/03/2023, keep up with doctor appointments       Depression Screen     09/03/2023    9:46 AM 07/30/2023   11:30 AM 02/19/2023    9:44 AM 08/08/2022    9:18 AM 02/11/2022    2:30 PM 07/26/2021    8:43 AM 07/20/2020    9:18 AM  PHQ 2/9 Scores  PHQ - 2 Score 0 0 0 0 0 0 0  PHQ- 9 Score 1 0 0        Fall Risk     09/03/2023    9:45 AM 07/30/2023   11:30 AM 02/19/2023    9:38 AM 12/19/2022    9:33 AM 08/08/2022    9:18 AM  Fall Risk   Falls in the past year? 0 0 0 0 0  Number falls in past yr: 0 0 0 0 0  Injury with Fall? 0 0 0 0 0  Risk for fall due to : Medication side effect No Fall Risks No Fall Risks No Fall Risks Impaired mobility;Impaired balance/gait  Follow up Falls prevention discussed;Falls evaluation completed Falls evaluation completed Falls evaluation completed Falls evaluation completed Falls prevention discussed;Education provided;Falls evaluation completed    MEDICARE RISK AT HOME:  Medicare Risk at Home Any stairs in or around the home?: Yes If so, are there any without handrails?: No Home free of loose throw rugs in walkways, pet beds, electrical cords, etc?: Yes Adequate lighting in your home to reduce risk of falls?: Yes Life alert?: No Use of a cane, walker or w/c?: Yes Grab bars in the bathroom?: Yes Shower chair or bench in shower?: Yes Elevated toilet seat or a handicapped toilet?: Yes  TIMED UP AND GO:  Was the test performed?  No  Cognitive Function: 6CIT completed        09/03/2023    9:46 AM 08/08/2022    9:20 AM 07/26/2021    8:44 AM 07/20/2020    9:20 AM 07/14/2019    8:58 AM  6CIT Screen  What Year? 0  points 0 points 0 points 0 points 0 points  What month? 0 points 0 points 0  points 0 points 0 points  What time? 0 points 0 points 0 points 0 points 0 points  Count back from 20 0 points 2 points 4 points 2 points 0 points  Months in reverse 2 points 2 points 4 points 2 points 0 points  Repeat phrase 4 points 4 points 6 points 6 points 2 points  Total Score 6 points 8 points 14 points 10 points 2 points    Immunizations Immunization History  Administered Date(s) Administered   Fluad Quad(high Dose 65+) 07/20/2020, 05/30/2022   Fluad Trivalent(High Dose 65+) 02/19/2023   Influenza, High Dose Seasonal PF 04/23/2018, 03/22/2019   PFIZER Comirnaty(Gray Top)Covid-19 Tri-Sucrose Vaccine 10/24/2020   PFIZER(Purple Top)SARS-COV-2 Vaccination 07/30/2019, 08/20/2019, 05/31/2020   PNEUMOCOCCAL CONJUGATE-20 02/11/2022   Tdap 01/31/2014   Zoster Recombinant(Shingrix) 02/15/2019, 04/16/2019    Screening Tests Health Maintenance  Topic Date Due   COVID-19 Vaccine (5 - 2024-25 season) 02/23/2023   DTaP/Tdap/Td (2 - Td or Tdap) 02/01/2024   Medicare Annual Wellness (AWV)  09/02/2024   Pneumonia Vaccine 49+ Years old  Completed   INFLUENZA VACCINE  Completed   DEXA SCAN  Completed   Hepatitis C Screening  Completed   Zoster Vaccines- Shingrix  Completed   HPV VACCINES  Aged Out   Colonoscopy  Discontinued    Health Maintenance  Health Maintenance Due  Topic Date Due   COVID-19 Vaccine (5 - 2024-25 season) 02/23/2023   Health Maintenance Items Addressed: Declines covid vaccine.  Additional Screening:  Vision Screening: Recommended annual ophthalmology exams for early detection of glaucoma and other disorders of the eye.  Dental Screening: Recommended annual dental exams for proper oral hygiene  Community Resource Referral / Chronic Care Management: CRR required this visit?  No   CCM required this visit?  No     Plan:     I have personally reviewed and noted the following in the patient's chart:   Medical and social history Use of alcohol, tobacco  or illicit drugs  Current medications and supplements including opioid prescriptions. Patient is not currently taking opioid prescriptions. Functional ability and status Nutritional status Physical activity Advanced directives List of other physicians Hospitalizations, surgeries, and ER visits in previous 12 months Vitals Screenings to include cognitive, depression, and falls Referrals and appointments  In addition, I have reviewed and discussed with patient certain preventive protocols, quality metrics, and best practice recommendations. A written personalized care plan for preventive services as well as general preventive health recommendations were provided to patient.     Barb Merino, LPN   09/30/8117   After Visit Summary: (Pick Up) Due to this being a telephonic visit, with patients personalized plan was offered to patient and patient has requested to Pick up at office.  Notes: Nothing significant to report at this time.

## 2023-09-18 ENCOUNTER — Ambulatory Visit: Payer: Self-pay | Admitting: Internal Medicine

## 2023-09-18 ENCOUNTER — Encounter: Payer: Self-pay | Admitting: Internal Medicine

## 2023-09-18 ENCOUNTER — Ambulatory Visit (INDEPENDENT_AMBULATORY_CARE_PROVIDER_SITE_OTHER): Admitting: Internal Medicine

## 2023-09-18 VITALS — BP 138/80 | HR 68 | Temp 98.3°F | Ht 59.0 in | Wt 176.0 lb

## 2023-09-18 DIAGNOSIS — E66812 Obesity, class 2: Secondary | ICD-10-CM

## 2023-09-18 DIAGNOSIS — I129 Hypertensive chronic kidney disease with stage 1 through stage 4 chronic kidney disease, or unspecified chronic kidney disease: Secondary | ICD-10-CM

## 2023-09-18 DIAGNOSIS — N182 Chronic kidney disease, stage 2 (mild): Secondary | ICD-10-CM | POA: Diagnosis not present

## 2023-09-18 DIAGNOSIS — Z6835 Body mass index (BMI) 35.0-35.9, adult: Secondary | ICD-10-CM | POA: Diagnosis not present

## 2023-09-18 DIAGNOSIS — R7309 Other abnormal glucose: Secondary | ICD-10-CM

## 2023-09-18 NOTE — Patient Instructions (Signed)
 Hypertension, Adult Hypertension is another name for high blood pressure. High blood pressure forces your heart to work harder to pump blood. This can cause problems over time. There are two numbers in a blood pressure reading. There is a top number (systolic) over a bottom number (diastolic). It is best to have a blood pressure that is below 120/80. What are the causes? The cause of this condition is not known. Some other conditions can lead to high blood pressure. What increases the risk? Some lifestyle factors can make you more likely to develop high blood pressure: Smoking. Not getting enough exercise or physical activity. Being overweight. Having too much fat, sugar, calories, or salt (sodium) in your diet. Drinking too much alcohol. Other risk factors include: Having any of these conditions: Heart disease. Diabetes. High cholesterol. Kidney disease. Obstructive sleep apnea. Having a family history of high blood pressure and high cholesterol. Age. The risk increases with age. Stress. What are the signs or symptoms? High blood pressure may not cause symptoms. Very high blood pressure (hypertensive crisis) may cause: Headache. Fast or uneven heartbeats (palpitations). Shortness of breath. Nosebleed. Vomiting or feeling like you may vomit (nauseous). Changes in how you see. Very bad chest pain. Feeling dizzy. Seizures. How is this treated? This condition is treated by making healthy lifestyle changes, such as: Eating healthy foods. Exercising more. Drinking less alcohol. Your doctor may prescribe medicine if lifestyle changes do not help enough and if: Your top number is above 130. Your bottom number is above 80. Your personal target blood pressure may vary. Follow these instructions at home: Eating and drinking  If told, follow the DASH eating plan. To follow this plan: Fill one half of your plate at each meal with fruits and vegetables. Fill one fourth of your plate  at each meal with whole grains. Whole grains include whole-wheat pasta, brown rice, and whole-grain bread. Eat or drink low-fat dairy products, such as skim milk or low-fat yogurt. Fill one fourth of your plate at each meal with low-fat (lean) proteins. Low-fat proteins include fish, chicken without skin, eggs, beans, and tofu. Avoid fatty meat, cured and processed meat, or chicken with skin. Avoid pre-made or processed food. Limit the amount of salt in your diet to less than 1,500 mg each day. Do not drink alcohol if: Your doctor tells you not to drink. You are pregnant, may be pregnant, or are planning to become pregnant. If you drink alcohol: Limit how much you have to: 0-1 drink a day for women. 0-2 drinks a day for men. Know how much alcohol is in your drink. In the [place] one drink equals one 12 oz bottle of beer (355 mL), one 5 oz glass of wine (148 mL), or one 1 oz glass of hard liquor (44 mL). Lifestyle  Work with your doctor to stay at a healthy weight or to lose weight. Ask your doctor what the best weight is for you. Get at least 30 minutes of exercise that causes your heart to beat faster (aerobic exercise) most days of the week. This may include walking, swimming, or biking. Get at least 30 minutes of exercise that strengthens your muscles (resistance exercise) at least 3 days a week. This may include lifting weights or doing Pilates. Do not smoke or use any products that contain nicotine or tobacco. If you need help quitting, ask your doctor. Check your blood pressure at home as told by your doctor. Keep all follow-up visits. Medicines Take over-the-counter and prescription medicines  only as told by your doctor. Follow directions carefully. Do not skip doses of blood pressure medicine. The medicine does not work as well if you skip doses. Skipping doses also puts you at risk for problems. Ask your doctor about side effects or reactions to medicines that you should watch  for. Contact a doctor if: You think you are having a reaction to the medicine you are taking. You have headaches that keep coming back. You feel dizzy. You have swelling in your ankles. You have trouble with your vision. Get help right away if: You get a very bad headache. You start to feel mixed up (confused). You feel weak or numb. You feel faint. You have very bad pain in your: Chest. Belly (abdomen). You vomit more than once. You have trouble breathing. These symptoms may be an emergency. Get help right away. Call 911. Do not wait to see if the symptoms will go away. Do not drive yourself to the hospital. Summary Hypertension is another name for high blood pressure. High blood pressure forces your heart to work harder to pump blood. For most people, a normal blood pressure is less than 120/80. Making healthy choices can help lower blood pressure. If your blood pressure does not get lower with healthy choices, you may need to take medicine. This information is not intended to replace advice given to you by your health care provider. Make sure you discuss any questions you have with your health care provider. Document Revised: 03/29/2021 Document Reviewed: 03/29/2021 Elsevier Patient Education  2024 [employer].

## 2023-09-18 NOTE — Progress Notes (Signed)
 I,Victoria T Deloria Lair, CMA,acting as a Neurosurgeon for Gwynneth Aliment, MD.,have documented all relevant documentation on the behalf of Gwynneth Aliment, MD,as directed by  Gwynneth Aliment, MD while in the presence of Gwynneth Aliment, MD.  Subjective:  Patient ID: Cynthia Barton , female    DOB: 22-Jan-1946 , 78 y.o.   MRN: [id number]  Chief Complaint  Patient presents with   Hypertension    HPI  Patient presents today for a blood pressure f/u. She reports compliance with meds. She denies having any headaches, chest pain and shortness of breath.   She adds that she has an upcoming colonoscopy.   Hypertension This is a chronic problem. The current episode started more than 1 year ago. The problem has been gradually improving since onset. The problem is controlled. Pertinent negatives include no blurred vision. Risk factors for coronary artery disease include dyslipidemia, post-menopausal state, obesity and sedentary lifestyle. The current treatment provides moderate improvement. Compliance problems include exercise.  Hypertensive end-organ damage includes kidney disease.     Past Medical History:  Diagnosis Date   Bilateral carpal tunnel syndrome 02/23/2020   Breast cancer (HCC) 09/07/2014   ER+/PR+/Her2- right breast   Breast cancer of lower-outer quadrant of right female breast (HCC) 09/06/2014   Hyperlipidemia    Hypertension    Radiation 11/10/14-12/09/14   Right Breast   Uterine prolapse 2016   Wears dentures    top   Wears glasses      Family History  Problem Relation Age of Onset   Melanoma Mother    Heart disease Father    Kidney failure Sister 92   Prostate cancer Brother    Heart disease Maternal Uncle    Colon cancer Neg Hx    Esophageal cancer Neg Hx    Stomach cancer Neg Hx    Rectal cancer Neg Hx      Current Outpatient Medications:    amLODipine (NORVASC) 2.5 MG tablet, Take 1 tablet by mouth once daily, Disp: 90 tablet, Rfl: 2   Ascorbic Acid (VITAMIN C) 1000 MG  tablet, Take 1 tablet (1,000 mg total) by mouth daily., Disp: , Rfl:    Black Elderberry,Berry-Flower, 575 MG CAPS, Take 1 capsule by mouth daily., Disp: , Rfl:    Cholecalciferol (VITAMIN D3) 125 MCG (5000 UT) CAPS, Take by mouth., Disp: , Rfl:    lisinopril (ZESTRIL) 20 MG tablet, Take 1 tablet (20 mg total) by mouth daily., Disp: 90 tablet, Rfl: 1   Multiple Vitamins-Minerals (MULTIVITAMIN WITH MINERALS) tablet, Take 1 tablet by mouth daily., Disp: , Rfl:    rosuvastatin (CRESTOR) 5 MG tablet, TAKE 1 TABLET EVERY DAY, Disp: 90 tablet, Rfl: 3   Sodium Sulfate-Mag Sulfate-KCl (SUTAB) [phone] MG TABS, Use as directed for colonoscopy. MANUFACTURER CODES!! BIN: [id number] PCN: CN GROUP: [id number] MEMBER ID: [id number];[employer] AS SECONDARY INSURANCE ;NO PRIOR AUTHORIZATION, Disp: 24 tablet, Rfl: 0   No Known Allergies   Review of Systems  Constitutional: Negative.   Eyes:  Negative for blurred vision.  Respiratory: Negative.    Cardiovascular: Negative.   Gastrointestinal: Negative.   Neurological: Negative.   Psychiatric/Behavioral: Negative.       Today's Vitals   09/18/23 1510  BP: 138/80  Pulse: 68  Temp: 98.3 F (36.8 C)  SpO2: 98%  Weight: 176 lb (79.8 kg)  Height: 4\' 11"  (1.499 m)   Body mass index is 35.55 kg/m.  Wt Readings from Last 3 Encounters:  09/23/23 176 lb (79.8 kg)  09/18/23  176 lb (79.8 kg)  08/13/23 176 lb (79.8 kg)     Objective:  Physical Exam Vitals and nursing note reviewed.  Constitutional:      Appearance: Normal appearance. She is obese.  HENT:     Head: Normocephalic and atraumatic.     Nose:     Comments: Masked     Mouth/Throat:     Comments: Masked  Eyes:     Extraocular Movements: Extraocular movements intact.  Cardiovascular:     Rate and Rhythm: Normal rate and regular rhythm.     Heart sounds: Normal heart sounds.  Pulmonary:     Effort: Pulmonary effort is normal.     Breath sounds: Normal breath sounds.  Musculoskeletal:      Cervical back: Normal range of motion.  Skin:    General: Skin is warm.  Neurological:     General: No focal deficit present.     Mental Status: She is alert.  Psychiatric:        Mood and Affect: Mood normal.        Behavior: Behavior normal.         Assessment And Plan:  Benign hypertension with CKD (chronic kidney disease), stage II Assessment & Plan: Chronic, fair control. Goal BP<120/80.  She will c/w lisinopril 20mg  and amlodipine 2.5mg  daily for now. Advised to take lisinopril 20mg  in AM and amlodipine 2.5mg  nightly. Reminded to follow low sodium diet. She is also encouraged to stay well hydrated. Importance of regular exercise was also discussed with the patient.   Orders: -     CMP14+EGFR  Other abnormal glucose Assessment & Plan: Previous labs reviewed, her A1c has been elevated in the past. I will check an A1c today. Reminded to avoid refined sugars including sugary drinks/foods and processed meats including bacon, sausages and deli meats.    Orders: -     CMP14+EGFR -     Hemoglobin A1c  Class 2 severe obesity due to excess calories with serious comorbidity and body mass index (BMI) of 35.0 to 35.9 in adult [facility]) Assessment & Plan: She is encouraged to strive for BMI less than 30 to decrease cardiac risk. Advised to aim for at least 150 minutes of exercise per week.    She is encouraged to strive for BMI less than 30 to decrease cardiac risk. Advised to aim for at least 150 minutes of exercise per week.    Return in about 3 months (around 12/19/2023).  Patient was given opportunity to ask questions. Patient verbalized understanding of the plan and was able to repeat key elements of the plan. All questions were answered to their satisfaction.    I, Gwynneth Aliment, MD, have reviewed all documentation for this visit. The documentation on 09/18/23 for the exam, diagnosis, procedures, and orders are all accurate and complete.   IF YOU HAVE BEEN REFERRED TO A  SPECIALIST, IT MAY TAKE 1-2 WEEKS TO SCHEDULE/PROCESS THE REFERRAL. IF YOU HAVE NOT HEARD FROM US/SPECIALIST IN TWO WEEKS, PLEASE GIVE [place] A CALL AT [phone].   THE PATIENT IS ENCOURAGED TO PRACTICE SOCIAL DISTANCING DUE TO THE COVID-19 PANDEMIC.

## 2023-09-19 ENCOUNTER — Telehealth: Payer: Self-pay | Admitting: *Deleted

## 2023-09-19 LAB — CMP14+EGFR
ALT: 23 IU/L (ref 0–32)
AST: 34 IU/L (ref 0–40)
Albumin: 4.3 g/dL (ref 3.8–4.8)
Alkaline Phosphatase: 76 IU/L (ref 44–121)
BUN/Creatinine Ratio: 14 (ref 12–28)
BUN: 11 mg/dL (ref 8–27)
Bilirubin Total: 0.7 mg/dL (ref 0.0–1.2)
CO2: 23 mmol/L (ref 20–29)
Calcium: 10.7 mg/dL — ABNORMAL HIGH (ref 8.7–10.3)
Chloride: 108 mmol/L — ABNORMAL HIGH (ref 96–106)
Creatinine, Ser: 0.77 mg/dL (ref 0.57–1.00)
Globulin, Total: 2.8 g/dL (ref 1.5–4.5)
Glucose: 82 mg/dL (ref 70–99)
Potassium: 4.4 mmol/L (ref 3.5–5.2)
Sodium: 146 mmol/L — ABNORMAL HIGH (ref 134–144)
Total Protein: 7.1 g/dL (ref 6.0–8.5)
eGFR: 79 mL/min/{1.73_m2} (ref >59)

## 2023-09-19 LAB — HEMOGLOBIN A1C
Est. average glucose Bld gHb Est-mCnc: 128 mg/dL
Hgb A1c MFr Bld: 6.1 % — ABNORMAL HIGH (ref 4.8–5.6)

## 2023-09-19 NOTE — Telephone Encounter (Signed)
 Noted pt's consent form is older than 23 days old.  She has a POA, her daughter, who signed her consent form originally.  Tried to reach daughter's cell phone- no answer and no mailbox full.  Reach pt at her home- she states her daughter will be with her for her procedure on Tuesday.

## 2023-09-20 ENCOUNTER — Other Ambulatory Visit: Payer: Self-pay | Admitting: Internal Medicine

## 2023-09-20 ENCOUNTER — Encounter: Payer: Self-pay | Admitting: Internal Medicine

## 2023-09-20 DIAGNOSIS — I129 Hypertensive chronic kidney disease with stage 1 through stage 4 chronic kidney disease, or unspecified chronic kidney disease: Secondary | ICD-10-CM

## 2023-09-20 DIAGNOSIS — R7309 Other abnormal glucose: Secondary | ICD-10-CM

## 2023-09-20 DIAGNOSIS — E66812 Obesity, class 2: Secondary | ICD-10-CM

## 2023-09-22 ENCOUNTER — Telehealth: Payer: Self-pay | Admitting: Gastroenterology

## 2023-09-22 NOTE — Telephone Encounter (Signed)
 Returned pts call and answered all questions regarding prep instructions.

## 2023-09-22 NOTE — Telephone Encounter (Signed)
 Patient called and stated that she was wanting to go over her instruction for the day before her procedure. Patient is having questions with the amount of water she is needing to take. Patient is requesting a call back. Please advise.

## 2023-09-23 ENCOUNTER — Ambulatory Visit (AMBULATORY_SURGERY_CENTER): Payer: Medicare HMO | Admitting: Gastroenterology

## 2023-09-23 ENCOUNTER — Encounter: Payer: Self-pay | Admitting: Gastroenterology

## 2023-09-23 VITALS — BP 148/92 | HR 63 | Temp 97.3°F | Resp 14 | Ht 59.0 in | Wt 176.0 lb

## 2023-09-23 DIAGNOSIS — K5730 Diverticulosis of large intestine without perforation or abscess without bleeding: Secondary | ICD-10-CM

## 2023-09-23 DIAGNOSIS — E785 Hyperlipidemia, unspecified: Secondary | ICD-10-CM | POA: Diagnosis not present

## 2023-09-23 DIAGNOSIS — K640 First degree hemorrhoids: Secondary | ICD-10-CM

## 2023-09-23 DIAGNOSIS — Z1211 Encounter for screening for malignant neoplasm of colon: Secondary | ICD-10-CM

## 2023-09-23 DIAGNOSIS — I10 Essential (primary) hypertension: Secondary | ICD-10-CM | POA: Diagnosis not present

## 2023-09-23 HISTORY — PX: COLONOSCOPY WITH PROPOFOL: SHX5780

## 2023-09-23 MED ORDER — SODIUM CHLORIDE 0.9 % IV SOLN: 500.0000 mL | Freq: Once | INTRAVENOUS | Status: DC

## 2023-09-23 NOTE — Patient Instructions (Signed)

## 2023-09-23 NOTE — Assessment & Plan Note (Signed)
 She is encouraged to strive for BMI less than 30 to decrease cardiac risk. Advised to aim for at least 150 minutes of exercise per week.

## 2023-09-23 NOTE — Assessment & Plan Note (Signed)
 Chronic, fair control. Goal BP<120/80.  She will c/w lisinopril 20mg  and amlodipine 2.5mg  daily for now. Advised to take lisinopril 20mg  in AM and amlodipine 2.5mg  nightly. Reminded to follow low sodium diet. She is also encouraged to stay well hydrated. Importance of regular exercise was also discussed with the patient.

## 2023-09-23 NOTE — Progress Notes (Signed)
 [facility] History and Physical   Primary Care Physician:  Dorothyann Peng, MD   Reason for Procedure:   Colon cancer screening  Plan:    Screening colonoscopy     HPI: Cynthia Barton is a 78 y.o. female undergoing average risk screening colonoscopy.  She has no family history of colon cancer and no chronic GI symptoms.  She had a normal colonoscopy in 2013.   Past Medical History:  Diagnosis Date   Bilateral carpal tunnel syndrome 02/23/2020   Breast cancer (HCC) 09/07/2014   ER+/PR+/Her2- right breast   Breast cancer of lower-outer quadrant of right female breast (HCC) 09/06/2014   Hyperlipidemia    Hypertension    Radiation 11/10/14-12/09/14   Right Breast   Uterine prolapse 2016   Wears dentures    top   Wears glasses     Past Surgical History:  Procedure Laterality Date   BREAST SURGERY  1980   rt br bx-no anesth   COLONOSCOPY  07/27/12   mild diverticulosis   lumpectomy right breast Right 09/30/14   TUBAL LIGATION      Prior to Admission medications   Medication Sig Start Date End Date Taking? Authorizing Provider  amLODipine (NORVASC) 2.5 MG tablet Take 1 tablet by mouth once daily 05/06/23  Yes Dorothyann Peng, MD  Ascorbic Acid (VITAMIN C) 1000 MG tablet Take 1 tablet (1,000 mg total) by mouth daily. 10/03/20  Yes Serena Croissant, MD  Black Elderberry,Berry-Flower, 575 MG CAPS Take 1 capsule by mouth daily. 10/03/20  Yes Serena Croissant, MD  Cholecalciferol (VITAMIN D3) 125 MCG (5000 UT) CAPS Take by mouth.   Yes [provider]  lisinopril (ZESTRIL) 20 MG tablet Take 1 tablet (20 mg total) by mouth daily. 01/08/23 01/08/24 Yes Dorothyann Peng, MD  Multiple Vitamins-Minerals (MULTIVITAMIN WITH MINERALS) tablet Take 1 tablet by mouth daily.   Yes [provider]  rosuvastatin (CRESTOR) 5 MG tablet TAKE 1 TABLET EVERY DAY 04/29/23  Yes Dorothyann Peng, MD  Sodium Sulfate-Mag Sulfate-KCl (SUTAB) [phone] MG TABS Use as directed for colonoscopy.  MANUFACTURER CODES!! BIN: [id number] PCN: CN GROUP: [id number] MEMBER ID: [id number];[employer] AS SECONDARY INSURANCE ;NO PRIOR AUTHORIZATION 08/13/23  Yes Jenel Lucks, MD    Current Outpatient Medications  Medication Sig Dispense Refill   amLODipine (NORVASC) 2.5 MG tablet Take 1 tablet by mouth once daily 90 tablet 2   Ascorbic Acid (VITAMIN C) 1000 MG tablet Take 1 tablet (1,000 mg total) by mouth daily.     Black Elderberry,Berry-Flower, 575 MG CAPS Take 1 capsule by mouth daily.     Cholecalciferol (VITAMIN D3) 125 MCG (5000 UT) CAPS Take by mouth.     lisinopril (ZESTRIL) 20 MG tablet Take 1 tablet (20 mg total) by mouth daily. 90 tablet 1   Multiple Vitamins-Minerals (MULTIVITAMIN WITH MINERALS) tablet Take 1 tablet by mouth daily.     rosuvastatin (CRESTOR) 5 MG tablet TAKE 1 TABLET EVERY DAY 90 tablet 3   Sodium Sulfate-Mag Sulfate-KCl (SUTAB) [phone] MG TABS Use as directed for colonoscopy. MANUFACTURER CODES!! BIN: [id number] PCN: CN GROUP: [id number] MEMBER ID: [id number];[employer] AS SECONDARY INSURANCE ;NO PRIOR AUTHORIZATION 24 tablet 0   Current Facility-Administered Medications  Medication Dose Route Frequency Provider Last Rate Last Admin   0.9 %  sodium chloride infusion  500 mL Intravenous Once Jenel Lucks, MD        Allergies as of 09/23/2023   (No Known Allergies)    Family History  Problem Relation Age of Onset  Melanoma Mother    Heart disease Father    Kidney failure Sister 59   Prostate cancer Brother    Heart disease Maternal Uncle    Colon cancer Neg Hx    Esophageal cancer Neg Hx    Stomach cancer Neg Hx    Rectal cancer Neg Hx     Social History   Socioeconomic History   Marital status: Divorced    Spouse name: Not on file   Number of children: Not on file   Years of education: Not on file   Highest education level: Not on file  Occupational History   Occupation: retired  Tobacco Use   Smoking status: Never   Smokeless tobacco: Never   Vaping Use   Vaping status: Never Used  Substance and Sexual Activity   Alcohol use: Not Currently    Alcohol/week: 0.0 standard drinks of alcohol   Drug use: No   Sexual activity: Not Currently    Comment: 1st intercourse 78 yo-Fewer than 5 partners  Other Topics Concern   Not on file  Social History Narrative   Not on file   Social Drivers of Health   Financial Resource Strain: Low Risk  (09/03/2023)   Overall Financial Resource Strain (CARDIA)    Difficulty of Paying Living Expenses: Not hard at all  Food Insecurity: No Food Insecurity (09/03/2023)   Hunger Vital Sign    Worried About Running Out of Food in the Last Year: Never true    Ran Out of Food in the Last Year: Never true  Transportation Needs: No Transportation Needs (09/03/2023)   PRAPARE - Administrator, Civil Service (Medical): No    Lack of Transportation (Non-Medical): No  Physical Activity: Inactive (09/03/2023)   Exercise Vital Sign    Days of Exercise per Week: 0 days    Minutes of Exercise per Session: 0 min  Stress: No Stress Concern Present (09/03/2023)   [employer] of Occupational Health - Occupational Stress Questionnaire    Feeling of Stress : Not at all  Social Connections: Moderately Isolated (09/03/2023)   Social Connection and Isolation Panel [NHANES]    Frequency of Communication with Friends and Family: More than three times a week    Frequency of Social Gatherings with Friends and Family: More than three times a week    Attends Religious Services: More than 4 times per year    Active Member of [employer] or Organizations: No    Attends Banker Meetings: Never    Marital Status: Divorced  Catering manager Violence: Not At Risk (09/03/2023)   Humiliation, Afraid, Rape, and Kick questionnaire    Fear of Current or Ex-Partner: No    Emotionally Abused: No    Physically Abused: No    Sexually Abused: No    Review of Systems:  All other review of systems negative  except as mentioned in the HPI.  Physical Exam: Vital signs BP (!) 156/75   Pulse 60   Temp (!) 97.3 F (36.3 C) (Temporal)   Ht 4\' 11"  (1.499 m)   Wt 176 lb (79.8 kg)   SpO2 98%   BMI 35.55 kg/m   General:   Alert,  Well-developed, well-nourished, pleasant and cooperative in NAD Airway:  Mallampati 2 Lungs:  Clear throughout to auscultation.   Heart:  Regular rate and rhythm; no murmurs, clicks, rubs,  or gallops. Abdomen:  Soft, nontender and nondistended. Normal bowel sounds.   Neuro/Psych:  Normal mood and affect. A  and O x 3   Joelee Snoke E. Tomasa Rand, MD [facility]

## 2023-09-23 NOTE — Assessment & Plan Note (Signed)
 Previous labs reviewed, her A1c has been elevated in the past. I will check an A1c today. Reminded to avoid refined sugars including sugary drinks/foods and processed meats including bacon, sausages and deli meats.

## 2023-09-23 NOTE — Progress Notes (Signed)
 Report to PACU, RN, vss, BBS= Clear.

## 2023-09-23 NOTE — Op Note (Signed)
 [facility] Patient Name: Cynthia Barton Procedure Date: 09/23/2023 9:44 AM MRN: [id number] Endoscopist: Lorin Picket E. Tomasa Rand , MD, [id number] Age: 78 Referring MD:  Date of Birth: September 30, 1945 Gender: Female Account #: [id number] Procedure:                Colonoscopy Indications:              Screening for colorectal malignant neoplasm (last                            colonoscopy was more than 10 years ago) Medicines:                Monitored Anesthesia Care Procedure:                Pre-Anesthesia Assessment:                           - Prior to the procedure, a History and Physical                            was performed, and patient medications and                            allergies were reviewed. The patient's tolerance of                            previous anesthesia was also reviewed. The risks                            and benefits of the procedure and the sedation                            options and risks were discussed with the patient.                            All questions were answered, and informed consent                            was obtained. Prior Anticoagulants: The patient has                            taken no anticoagulant or antiplatelet agents. ASA                            Grade Assessment: II - A patient with mild systemic                            disease. After reviewing the risks and benefits,                            the patient was deemed in satisfactory condition to                            undergo the procedure.  After obtaining informed consent, the colonoscope                            was passed under direct vision. Throughout the                            procedure, the patient's blood pressure, pulse, and                            oxygen saturations were monitored continuously. The                            Olympus Scope SN: [id number] was introduced through                            the anus and  advanced to the the terminal ileum,                            with identification of the appendiceal orifice and                            IC valve. The colonoscopy was performed without                            difficulty. The patient tolerated the procedure                            well. The quality of the bowel preparation was                            excellent. The terminal ileum, ileocecal valve,                            appendiceal orifice, and rectum were photographed.                            The bowel preparation used was SUTAB via split dose                            instruction. Scope In: 9:54:13 AM Scope Out: 10:01:55 AM Scope Withdrawal Time: 0 hours 4 minutes 36 seconds  Total Procedure Duration: 0 hours 7 minutes 42 seconds  Findings:                 The perianal and digital rectal examinations were                            normal. Pertinent negatives include normal                            sphincter tone and no palpable rectal lesions.                           Multiple medium-mouthed and small-mouthed  diverticula were found in the sigmoid colon and                            descending colon.                           The exam was otherwise normal throughout the                            examined colon.                           The terminal ileum appeared normal.                           Non-bleeding internal hemorrhoids were found during                            retroflexion. The hemorrhoids were Grade I                            (internal hemorrhoids that do not prolapse).                           No additional abnormalities were found on                            retroflexion. Complications:            No immediate complications. Estimated Blood Loss:     Estimated blood loss: none. Impression:               - Moderate diverticulosis in the sigmoid colon and                            in the descending colon.                            - The examined portion of the ileum was normal.                           - Non-bleeding internal hemorrhoids.                           - No specimens collected. Recommendation:           - Patient has a contact number available for                            emergencies. The signs and symptoms of potential                            delayed complications were discussed with the                            patient. Return to normal activities tomorrow.  Written discharge instructions were provided to the                            patient.                           - Resume previous diet.                           - Continue present medications.                           - Recommend against any futher colon cancer                            screening                           - Recommend high fiber diet/daily fiber supplement                            to reduce risk of diverticular complications. [name], MD 09/23/2023 10:07:03 AM This report has been signed electronically.

## 2023-09-24 ENCOUNTER — Telehealth: Payer: Self-pay | Admitting: *Deleted

## 2023-09-24 NOTE — Telephone Encounter (Signed)
  Follow up Call-     09/23/2023    9:23 AM  Call back number  Post procedure Call Back phone  # [phone]  Permission to leave phone message Yes     Patient questions:  Do you have a fever, pain , or abdominal swelling? No. Pain Score  0 *  Have you tolerated food without any problems? Yes.    Have you been able to return to your normal activities? Yes.    Do you have any questions about your discharge instructions: Diet   No. Medications  No. Follow up visit  No.  Do you have questions or concerns about your Care? No.  Actions: * If pain score is 4 or above: No action needed, pain <4.

## 2023-09-30 ENCOUNTER — Telehealth: Payer: Self-pay

## 2023-09-30 NOTE — Telephone Encounter (Signed)
 Called  re: PREP program referral, left voicemail requesting return call.

## 2023-10-07 ENCOUNTER — Other Ambulatory Visit: Payer: Self-pay | Admitting: Internal Medicine

## 2023-10-07 DIAGNOSIS — I129 Hypertensive chronic kidney disease with stage 1 through stage 4 chronic kidney disease, or unspecified chronic kidney disease: Secondary | ICD-10-CM

## 2023-10-20 ENCOUNTER — Encounter: Payer: Self-pay | Admitting: Obstetrics and Gynecology

## 2023-10-20 ENCOUNTER — Ambulatory Visit: Admitting: Obstetrics and Gynecology

## 2023-10-20 VITALS — BP 127/81 | HR 68 | Ht 58.25 in | Wt 172.0 lb

## 2023-10-20 DIAGNOSIS — N950 Postmenopausal bleeding: Secondary | ICD-10-CM

## 2023-10-20 DIAGNOSIS — N813 Complete uterovaginal prolapse: Secondary | ICD-10-CM | POA: Diagnosis not present

## 2023-10-20 DIAGNOSIS — N3281 Overactive bladder: Secondary | ICD-10-CM | POA: Diagnosis not present

## 2023-10-20 DIAGNOSIS — R350 Frequency of micturition: Secondary | ICD-10-CM

## 2023-10-20 LAB — POCT URINALYSIS DIPSTICK
Bilirubin, UA: NEGATIVE
Blood, UA: NEGATIVE
Glucose, UA: NEGATIVE
Ketones, UA: NEGATIVE
Leukocytes, UA: NEGATIVE
Nitrite, UA: NEGATIVE
Protein, UA: NEGATIVE
Spec Grav, UA: 1.015 (ref 1.010–1.025)
Urobilinogen, UA: 0.2 U/dL
pH, UA: 6 (ref 5.0–8.0)

## 2023-10-20 NOTE — Progress Notes (Signed)
 New Patient Evaluation and Consultation  Referring Provider: Cleave Curling, MD PCP: Cleave Curling, MD Date of Service: 10/20/2023  SUBJECTIVE Chief Complaint: New Patient (Initial Visit) Cynthia Barton is a 78 y.o. female here for a postmenopausal bleeding and prolapse./)  History of Present Illness: Cynthia Barton is a 78 y.o. Black or African-American female seen in consultation at the request of Dr Elnita Hai for evaluation of prolapse and post menopausal bleeding.    Review of records significant for: Pt complained of vaginal bleeding and was noted to have prolapse on exam.   Pap smear (07/30/23)- negative, HPV neg  Pelvic US  08/13/23:  The uterus is anteverted in position and measures 7.2 x 4.0 x 2.2 cm. It demonstrates a heterogeneous echotexture with multiple peripheral vascular calcifications. A calcified fibroid measuring 1.2 x 0.9 cm is identified within the posterior myometrium. The endometrium measures 0.5 cm and demonstrates a normal homogeneous echotexture. Multiple nabothian cysts are noted.   The ovaries are not visualized likely due to patient's age/postmenopausal state.   There is no fluid present within the cul-de-sac.   IMPRESSION: 1. Heterogeneous uterine echotexture with posterior fibroid measuring 1.2 cm.   2.  Nonvisualization of the ovaries.  Urinary Symptoms: Leaks urine with going from sitting to standing, with a full bladder, with movement to the bathroom, and with urgency Leaks 5 time(s) per day.  Pad use: 6 adult diapers per day.   Patient is bothered by UI symptoms.  Day time voids: 6.  Nocturia: 1 times per night to void. Voiding dysfunction:  empties bladder well. Does not void much because she usually leaks most before she gets to the bathroom.  Patient does not use a catheter to empty bladder.  When urinating, patient feels difficulty starting urine stream Drinks: water, guava or cranberry fruit juice per day, occasional hot chocolate or grape  soda.  Has never been on a medication for her bladder.   UTIs:  0  UTI's in the last year.   Denies history of blood in urine and kidney or bladder stones  Pelvic Organ Prolapse Symptoms:                  Patient Admits to a feeling of a bulge the vaginal area. It has been present for 1 years.  Patient Admits to seeing a bulge.  This bulge is bothersome- more when she is working in the yard.   Bowel Symptom: Bowel movements: 2 time(s) per day Stool consistency: soft  Straining: no.  Splinting: no.  Incomplete evacuation: no.  Patient Denies accidental bowel leakage / fecal incontinence Bowel regimen: none  HM Colonoscopy          Completed or No Longer Recommended     Colonoscopy  Discontinued      Frequency changed to Never automatically (Topic No Longer Applies)   09/23/2023  COLONOSCOPY   Only the first 1 history entries have been loaded, but more history exists.                Sexual Function Sexually active: no.    Pelvic Pain Denies pelvic pain    Past Medical History:  Past Medical History:  Diagnosis Date   Bilateral carpal tunnel syndrome 02/23/2020   Breast cancer (HCC) 09/07/2014   ER+/PR+/Her2- right breast   Breast cancer of lower-outer quadrant of right female breast (HCC) 09/06/2014   Hyperlipidemia    Hypertension    Radiation 11/10/14-12/09/14   Right Breast   Uterine prolapse 2016  Wears dentures    top   Wears glasses      Past Surgical History:   Past Surgical History:  Procedure Laterality Date   BREAST SURGERY  1980   rt br bx-no anesth   COLONOSCOPY  07/27/12   mild diverticulosis   lumpectomy right breast Right 09/30/14   TUBAL LIGATION       Past OB/GYN History: OB History  Gravida Para Term Preterm AB Living  2 2 2   2   SAB IAB Ectopic Multiple Live Births      2    # Outcome Date GA Lbr Len/2nd Weight Sex Type Anes PTL Lv  2 Term      Vag-Spont     1 Term      Vag-Spont       Obstetric Comments  Menarche age  89 or 15, BC x 5 years, G2, P2, Menopause age in her ~ age 81, No HRT x 3 years    Menopausal: Admits to vaginal bleeding since menopause- had an episode in march where she noticed blood with wiping but has not had any since.       Component Value Date/Time   DIAGPAP  07/30/2023 1204    - Negative for intraepithelial lesion or malignancy (NILM)   DIAGPAP  02/11/2022 1455    - Negative for intraepithelial lesion or malignancy (NILM)   HPVHIGH Negative 07/30/2023 1204   HPVHIGH Negative 02/11/2022 1455   ADEQPAP  07/30/2023 1204    Satisfactory for evaluation; transformation zone component PRESENT.   ADEQPAP  02/11/2022 1455    Satisfactory for evaluation; transformation zone component PRESENT.    Medications: Patient has a current medication list which includes the following prescription(s): amlodipine , vitamin c , black elderberry(berry-flower), vitamin d3, lisinopril , multivitamin with minerals, rosuvastatin , and sutab .   Allergies: Patient has no allergies on file.   Social History:  Social History   Tobacco Use   Smoking status: Never   Smokeless tobacco: Never  Vaping Use   Vaping status: Never Used  Substance Use Topics   Alcohol use: Not Currently    Alcohol/week: 0.0 standard drinks of alcohol   Drug use: No    Relationship status: divorced Patient lives alone Patient is not employed . Regular exercise: No History of abuse: No  Family History:   Family History  Problem Relation Age of Onset   Melanoma Mother    Heart disease Father    Kidney failure Sister 79   Prostate cancer Brother    Heart disease Maternal Uncle    Colon cancer Neg Hx    Esophageal cancer Neg Hx    Stomach cancer Neg Hx    Rectal cancer Neg Hx      Review of Systems: Review of Systems  Constitutional:  Negative for fever, malaise/fatigue and weight loss.  Respiratory:  Negative for cough, shortness of breath and wheezing.   Cardiovascular:  Negative for chest pain, palpitations  and leg swelling.  Gastrointestinal:  Negative for abdominal pain and blood in stool.  Genitourinary:  Negative for dysuria.  Musculoskeletal:  Positive for myalgias.  Skin:  Negative for rash.  Neurological:  Negative for dizziness and headaches.  Endo/Heme/Allergies:  Does not bruise/bleed easily.  Psychiatric/Behavioral:  Negative for depression. The patient is not nervous/anxious.      OBJECTIVE Physical Exam: Vitals:   10/20/23 1258  BP: 127/81  Pulse: 68  Weight: 172 lb (78 kg)  Height: 4' 10.25" (1.48 m)    Physical Exam Vitals  reviewed. Exam conducted with a chaperone present.  Constitutional:      General: She is not in acute distress. Pulmonary:     Effort: Pulmonary effort is normal.  Abdominal:     General: There is no distension.     Palpations: Abdomen is soft.     Tenderness: There is no abdominal tenderness. There is no rebound.  Musculoskeletal:        General: No swelling. Normal range of motion.  Skin:    General: Skin is warm and dry.     Findings: No rash.  Neurological:     Mental Status: She is alert and oriented to person, place, and time.  Psychiatric:        Mood and Affect: Mood normal.        Behavior: Behavior normal.      GU / Detailed Urogynecologic Evaluation:  Pelvic Exam: Normal external female genitalia; Bartholin's and Skene's glands normal in appearance; urethral meatus normal in appearance, no urethral masses or discharge.   CST: negative  Speculum exam reveals normal vaginal mucosa with atrophy. Cervix normal appearance. Uterus normal single, nontender. Adnexa no mass, fullness, tenderness.     Pelvic floor strength II/V, puborectalis IV/V external anal sphincter IV/V  Pelvic floor musculature: Right levator non-tender, Right obturator non-tender, Left levator non-tender, Left obturator non-tender  POP-Q:   POP-Q  3                                            Aa   4.5                                           Ba   4.5                                              C   4.5                                            Gh  3.5                                            Pb  7                                            tvl   2                                            Ap  5  Bp  -2                                              D      Rectal Exam:  Normal sphincter tone, moderate distal rectocele, enterocoele present, no rectal masses, no sign of dyssynergia when asking the patient to bear down.  Post-Void Residual (PVR) by Bladder Scan: In order to evaluate bladder emptying, we discussed obtaining a postvoid residual and patient agreed to this procedure.  Procedure: The ultrasound unit was placed on the patient's abdomen in the suprapubic region after the patient had voided.    Post Void Residual - 10/20/23 1319       Post Void Residual   Post Void Residual 125 mL              Laboratory Results: Lab Results  Component Value Date   COLORU Yellow 10/20/2023   CLARITYU Clear 10/20/2023   GLUCOSEUR Negative 10/20/2023   BILIRUBINUR Negative 10/20/2023   KETONESU Negative 10/20/2023   SPECGRAV 1.015 10/20/2023   RBCUR Negative 10/20/2023   PHUR 6.0 10/20/2023   PROTEINUR Negative 10/20/2023   UROBILINOGEN 0.2 10/20/2023   LEUKOCYTESUR Negative 10/20/2023    Lab Results  Component Value Date   CREATININE 0.77 09/18/2023   CREATININE 0.80 02/19/2023   CREATININE 0.77 12/19/2022    Lab Results  Component Value Date   HGBA1C 6.1 (H) 09/18/2023    Lab Results  Component Value Date   HGB 12.5 07/30/2023     ASSESSMENT AND PLAN Ms. Worthing is a 78 y.o. with:  1. Uterovaginal prolapse, complete   2. Post-menopausal bleeding   3. Overactive bladder   4. Urinary frequency     Uterovaginal prolapse, complete Assessment & Plan: Stage IV anterior, Stage IV posterior, Stage IV apical prolapse - For treatment of pelvic organ  prolapse, we discussed options for management including expectant management, conservative management, and surgical management, such as Kegels, a pessary, pelvic floor physical therapy, and specific surgical procedures. - She may be interested in surgery. She is not currently sexually active but wants to leave the option open for intercourse. We discussed two options for prolapse repair:  1) vaginal repair without mesh - Pros - safer, no mesh complications - Cons - not as strong as mesh repair, higher risk of recurrence  2) laparoscopic repair with mesh - Pros - stronger, better long-term success - Cons - risks of mesh implant (erosion into vagina or bladder, adhering to the rectum, pain) - these risks are lower than with a vaginal mesh but still exist - Handouts provided on surgical options for her to review.    Post-menopausal bleeding Assessment & Plan: - negative Pap  - Pelvic US  shows thickened endometrium. We discussed need for endometrial biopsy. Will plan to perform this at the time of urodynamic testing.    Overactive bladder Assessment & Plan: - unclear if she has true OAB or if prolapse is causing some obstruction and overflow incontinence - Will evaluate further with urodynamic testing   Urinary frequency -     POCT urinalysis dipstick   Return for urodynamics and EMB  Arma Lamp, MD

## 2023-10-20 NOTE — Assessment & Plan Note (Signed)
-   unclear if she has true OAB or if prolapse is causing some obstruction and overflow incontinence - Will evaluate further with urodynamic testing

## 2023-10-20 NOTE — Assessment & Plan Note (Signed)
-   negative Pap  - Pelvic US  shows thickened endometrium. We discussed need for endometrial biopsy. Will plan to perform this at the time of urodynamic testing.

## 2023-10-20 NOTE — Assessment & Plan Note (Signed)
 Stage IV anterior, Stage IV posterior, Stage IV apical prolapse - For treatment of pelvic organ prolapse, we discussed options for management including expectant management, conservative management, and surgical management, such as Kegels, a pessary, pelvic floor physical therapy, and specific surgical procedures. - She may be interested in surgery. She is not currently sexually active but wants to leave the option open for intercourse. We discussed two options for prolapse repair:  1) vaginal repair without mesh - Pros - safer, no mesh complications - Cons - not as strong as mesh repair, higher risk of recurrence  2) laparoscopic repair with mesh - Pros - stronger, better long-term success - Cons - risks of mesh implant (erosion into vagina or bladder, adhering to the rectum, pain) - these risks are lower than with a vaginal mesh but still exist - Handouts provided on surgical options for her to review.

## 2023-10-20 NOTE — Patient Instructions (Signed)
You have a stage 4 (out of 4) prolapse.  We discussed the fact that it is not life threatening but there are several treatment options. For treatment of pelvic organ prolapse, we discussed options for management including expectant management, conservative management, and surgical management, such as Kegels, a pessary, pelvic floor physical therapy, and specific surgical procedures.     URODYNAMICS (UDS) TEST INFORMATION  IMPORTANT: Please try to arrive with a comfortably full bladder!    What is UDS? Urodynamics is a bladder test used to evaluate how your bladder and urethra (tube you urinate out of) work to help find out the cause of your bladder symptoms and evaluate your bladder function in order to make the best treatment plan for you.   What to expect? A nurse will perform the test and will be with you during the entire exam. First we will have to empty your bladder on a special toilet.  After you have emptied your bladder, very small catheters (plastic tubing) will be placed into your bladder and into your vagina (or rectum). These special small catheters measure pressure to help measure your bladder function.  Your bladder will be gently filled with water and you will be asked to cough and strain at several different points during the test.   You will then be asked to empty your bladder in the special toilet with the catheters in place. Most patients can urinate (pee) easily with the catheters in place since the catheters are so small. In total this procedure lasts about 45 minutes to 1 hour.  After your test is completed, you will return (or possibly be seen the same day) to review the results, talk about treatment options and make a plan moving forward.

## 2023-10-26 ENCOUNTER — Other Ambulatory Visit: Payer: Self-pay | Admitting: Internal Medicine

## 2023-10-26 DIAGNOSIS — I129 Hypertensive chronic kidney disease with stage 1 through stage 4 chronic kidney disease, or unspecified chronic kidney disease: Secondary | ICD-10-CM

## 2023-10-26 DIAGNOSIS — Z853 Personal history of malignant neoplasm of breast: Secondary | ICD-10-CM

## 2023-10-26 DIAGNOSIS — E7800 Pure hypercholesterolemia, unspecified: Secondary | ICD-10-CM

## 2023-11-04 ENCOUNTER — Telehealth: Payer: Self-pay

## 2023-11-04 NOTE — Telephone Encounter (Signed)
 She returned my call, explained PREP only offered at [facility] for now; she would like to attend, but is traveling in June; she is tentatively scheduled to start July 8 class, every T/h at 12 noon; will contact next month to confirm and set up assessment visit.

## 2023-11-19 DIAGNOSIS — F4323 Adjustment disorder with mixed anxiety and depressed mood: Secondary | ICD-10-CM | POA: Diagnosis not present

## 2023-11-25 ENCOUNTER — Other Ambulatory Visit (HOSPITAL_COMMUNITY)
Admission: RE | Admit: 2023-11-25 | Discharge: 2023-11-25 | Disposition: A | Discharge status: Home / Self Care | Source: Ambulatory Visit | Attending: Obstetrics and Gynecology | Admitting: Obstetrics and Gynecology

## 2023-11-25 ENCOUNTER — Ambulatory Visit (INDEPENDENT_AMBULATORY_CARE_PROVIDER_SITE_OTHER): Admitting: Obstetrics and Gynecology

## 2023-11-25 ENCOUNTER — Encounter: Payer: Self-pay | Admitting: Obstetrics and Gynecology

## 2023-11-25 VITALS — BP 158/85 | HR 75

## 2023-11-25 DIAGNOSIS — N3281 Overactive bladder: Secondary | ICD-10-CM

## 2023-11-25 DIAGNOSIS — N393 Stress incontinence (female) (male): Secondary | ICD-10-CM | POA: Diagnosis not present

## 2023-11-25 DIAGNOSIS — N950 Postmenopausal bleeding: Secondary | ICD-10-CM | POA: Insufficient documentation

## 2023-11-25 DIAGNOSIS — R350 Frequency of micturition: Secondary | ICD-10-CM

## 2023-11-25 DIAGNOSIS — R948 Abnormal results of function studies of other organs and systems: Secondary | ICD-10-CM | POA: Diagnosis not present

## 2023-11-25 LAB — POCT URINALYSIS DIPSTICK
Bilirubin, UA: NEGATIVE
Blood, UA: NEGATIVE
Glucose, UA: NEGATIVE
Ketones, UA: NEGATIVE
Leukocytes, UA: NEGATIVE
Nitrite, UA: NEGATIVE
Protein, UA: NEGATIVE
Spec Grav, UA: 1.01 (ref 1.010–1.025)
Urobilinogen, UA: 0.2 U/dL
pH, UA: 6.5 (ref 5.0–8.0)

## 2023-11-25 NOTE — Progress Notes (Signed)
 [facility] Urodynamics Procedure  Referring Physician: Cleave Curling, MD Date of Procedure: 11/25/2023  Cynthia Barton is a 78 y.o. female who presents for urodynamic evaluation. Indication(s) for study: occult SUI and OAB  Vital Signs: BP (!) 158/85   Pulse 75   Laboratory Results: A catheterized urine specimen revealed:  POC urine:  Lab Results  Component Value Date   COLORU yellow 11/25/2023   CLARITYU clear 11/25/2023   GLUCOSEUR Negative 11/25/2023   BILIRUBINUR negative 11/25/2023   KETONESU negative 11/25/2023   SPECGRAV 1.010 11/25/2023   RBCUR negative 11/25/2023   PHUR 6.5 11/25/2023   PROTEINUR Negative 11/25/2023   UROBILINOGEN 0.2 11/25/2023   LEUKOCYTESUR Negative 11/25/2023     Voiding Diary: Deferred   Procedure Timeout:  The correct patient was verified and the correct procedure was verified. The patient was in the correct position and safety precautions were reviewed based on at the patient's history.  Urodynamic Procedure A 21F dual lumen urodynamics catheter was placed under sterile conditions into the patient's bladder. A 21F catheter was placed into the rectum in order to measure abdominal pressure. EMG patches were placed in the appropriate position.  All connections were confirmed and calibrations/adjusted made. Saline was instilled into the bladder through the dual lumen catheters.  Cough/valsalva pressures were measured periodically during filling.  Patient was allowed to void.  The bladder was then emptied of its residual.  UROFLOW: Revealed a Qmax of 8.4 mL/sec.  She voided 142 mL and had a residual of 425 mL.  It was a normal pattern and represented normal habits.   CMG: This was performed with sterile water in the sitting position at a fill rate of 30 mL/min.    First sensation of fullness was 132 mLs,  First urge was 139 mLs,  Strong urge was 140 mLs and  Capacity was 331 mLs  Stress incontinence was demonstrated Highest  positive CLPP was 105 cmH20 at 201 ml. Highest positive VLPP was 97 cmH20at 201 ml.   Detrusor function was overactive, with phasic contractions seen.  The first occurred at 220 mL to 4 cm of water and was associated with leakage.  Compliance:  Normal. End fill detrusor pressure was 0cmH20.    UPP: MUCP with barrier reduction was 34 cm of water.    MICTURITION STUDY: Voiding was performed with reduction using scopettes in the sitting position.  Pdet at Qmax was 0 cm of water.  Qmax was 12 mL/sec.  It was a normal pattern.  She voided 106 mL and had a residual of 225 mL.  It was a volitional void, detrusor contraction was low amplitude and interrupted. Abdominal straining was not present  EMG: This was performed with patches.  She had voluntary contractions, recruitment with fill was not present and urethral sphincter was relaxed with void.  The details of the procedure with the study tracings have been scanned into EPIC.   Urodynamic Impression:  1. Sensation was normal; capacity was reduced 2. Stress Incontinence was demonstrated at normal pressures; 3. Detrusor Overactivity was demonstrated with leakage. 4. Emptying was dysfunctional with a elevated PVR(425 with Uroflow, 225 with Micturition study), a sustained detrusor contraction not present,  abdominal straining not present, normal urethral sphincter activity on EMG.  Plan: - The patient will follow up  to discuss the findings and treatment options.  -Endometrial biopsy completed following urodynamics due to endometrial thickening noted on previous imaging.

## 2023-11-27 LAB — SURGICAL PATHOLOGY

## 2023-12-01 ENCOUNTER — Ambulatory Visit: Payer: Self-pay | Admitting: Obstetrics and Gynecology

## 2023-12-05 ENCOUNTER — Ambulatory Visit: Payer: Medicare HMO | Admitting: Obstetrics and Gynecology

## 2023-12-10 DIAGNOSIS — F4323 Adjustment disorder with mixed anxiety and depressed mood: Secondary | ICD-10-CM | POA: Diagnosis not present

## 2023-12-15 ENCOUNTER — Ambulatory Visit: Admitting: Obstetrics and Gynecology

## 2023-12-15 ENCOUNTER — Encounter: Payer: Self-pay | Admitting: Obstetrics and Gynecology

## 2023-12-15 VITALS — BP 109/63 | HR 74

## 2023-12-15 DIAGNOSIS — R339 Retention of urine, unspecified: Secondary | ICD-10-CM | POA: Diagnosis not present

## 2023-12-15 DIAGNOSIS — N812 Incomplete uterovaginal prolapse: Secondary | ICD-10-CM

## 2023-12-15 DIAGNOSIS — N393 Stress incontinence (female) (male): Secondary | ICD-10-CM

## 2023-12-15 NOTE — Progress Notes (Signed)
  Urogynecology Return Visit  SUBJECTIVE  History of Present Illness: Cynthia Barton is a 78 y.o. female seen in follow-up for prolapse and incontinence. She underwent urodynamic testing.   Urodynamic Impression:  1. Sensation was normal; capacity was reduced 2. Stress Incontinence was demonstrated at normal pressures; 3. Detrusor Overactivity was demonstrated with leakage. 4. Emptying was dysfunctional with a elevated PVR (425 with Uroflow, 225 with Micturition study), a sustained detrusor contraction not present,  abdominal straining not present, normal urethral sphincter activity on EMG.  Past Medical History: Patient  has a past medical history of Bilateral carpal tunnel syndrome (02/23/2020), Breast cancer (HCC) (09/07/2014), Breast cancer of lower-outer quadrant of right female breast (HCC) (09/06/2014), Hyperlipidemia, Hypertension, Radiation (11/10/14-12/09/14), Uterine prolapse (2016), Wears dentures, and Wears glasses.   Past Surgical History: She  has a past surgical history that includes Tubal ligation; Colonoscopy (07/27/12); Breast surgery (1980); and lumpectomy right breast (Right, 09/30/14).   Medications: She has a current medication list which includes the following prescription(s): amlodipine , vitamin c , black elderberry(berry-flower), vitamin d3, lisinopril , multivitamin with minerals, and rosuvastatin .   Allergies: Patient has no known allergies.   Social History: Patient  reports that she has never smoked. She has never used smokeless tobacco. She reports that she does not currently use alcohol. She reports that she does not use drugs.     OBJECTIVE     Physical Exam: Vitals:   12/15/23 0945  BP: 109/63  Pulse: 74   Gen: No apparent distress, A&O x 3.  Detailed Urogynecologic Evaluation:  Deferred. Prior exam showed:  POP-Q   3                                            Aa   4.5                                           Ba   4.5                                               C    4.5                                            Gh   3.5                                            Pb   7                                            tvl    2  Ap   5                                            Bp   -2                                              D        ASSESSMENT AND PLAN    Cynthia Barton is a 78 y.o. with:  1. Uterovaginal prolapse, incomplete   2. Incomplete bladder emptying   3. SUI (stress urinary incontinence, female)    - Due to incomplete bladder emptying, recommended staged procedure for SUI. So will reassess symptoms at post op visit and determine next steps. We discussed options of sling and urethral bulking.   Plan for surgery: Exam under anesthesia, robotic assisted total laparoscopic hysterectomy, bilateral salpingo-oophorectomy, sacrocolpopexy, cystoscopy, perineorrhaphy, possible posterior repair.   - We reviewed the patient's specific anatomic and functional findings, with the assistance of diagrams, and together finalized the above procedure. The planned surgical procedures were discussed along with the surgical risks outlined below, which were also provided on a detailed handout. Additional treatment options including expectant management, conservative management, medical management were discussed where appropriate.  We reviewed the benefits and risks of each treatment option.   General Surgical Risks: For all procedures, there are risks of bleeding, infection, damage to surrounding organs including but not limited to bowel, bladder, blood vessels, ureters and nerves, and need for further surgery if an injury were to occur. These risks are all low with minimally invasive surgery.   There are risks of numbness and weakness at any body site or buttock/rectal pain.  It is possible that baseline pain can be worsened by surgery, either with or without mesh. If surgery is vaginal,  there is also a low risk of possible conversion to laparoscopy or open abdominal incision where indicated. Very rare risks include blood transfusion, blood clot, heart attack, pneumonia, or death.   There is also a risk of short-term postoperative urinary retention with need to use a catheter. About half of patients need to go home from surgery with a catheter, which is then later removed in the office. The risk of long-term need for a catheter is very low. There is also a risk of worsening of overactive bladder.    Prolapse (with or without mesh): Risk factors for surgical failure  include things that put pressure on your pelvis and the surgical repair, including obesity, chronic cough, and heavy lifting or straining (including lifting children or adults, straining on the toilet, or lifting heavy objects such as furniture or anything weighing >25 lbs. Risks of recurrence is 20-30% with vaginal native tissue repair and a less than 10% with sacrocolpopexy with mesh.    Sacrocolpopexy: Mesh implants may provide more prolapse support, but do have some unique risks to consider. It is important to understand that mesh is permanent and cannot be easily removed. Risks of abdominal sacrocolpopexy mesh include mesh exposure (~3-6%), painful intercourse (recent studies show lower rates after surgery compared to before, with ~5-8% risk of new onset), and very rare risks of bowel or bladder injury or infection (<1%), including risk of spinal osteomyelitis. The risk of mesh exposure is more likely in a  woman with risks for poor healing (prior radiation, poorly controlled diabetes, or immunocompromised). The risk of new or worsened chronic pain after mesh implant is more common in women with baseline chronic pain and/or poorly controlled anxiety or depression. There is an FDA safety notification on vaginal mesh procedures for prolapse but NOT abdominal mesh procedures and therefore does not apply to your surgery. We have  extensive experience and training with mesh placement and we have close postoperative follow up to identify any potential complications from mesh.    - For preop Visit:  She is required to have a visit within 30 days of her surgery.   - Medical clearance: not required  - Anticoagulant use: No - Medicaid Hysterectomy form: No - Accepts blood transfusion: Yes - Expected length of stay: outpatient  Request sent for surgery scheduling.   [name], MD

## 2023-12-18 ENCOUNTER — Telehealth: Payer: Self-pay

## 2023-12-18 NOTE — Telephone Encounter (Signed)
Called to confirm participation in next PREP class at [facility] and set up assessment visit; left voicemail requesting return call.

## 2023-12-22 ENCOUNTER — Telehealth: Payer: Self-pay

## 2023-12-22 NOTE — Telephone Encounter (Signed)
 Called to confirm participation in next PREP class, she states she has to have GYN surgery and will have to postpone this class; asked her to let Dr. Jarold know when she is recovered, medically cleared and able to exercise so she can send another referral.

## 2024-01-02 DIAGNOSIS — F4323 Adjustment disorder with mixed anxiety and depressed mood: Secondary | ICD-10-CM | POA: Diagnosis not present

## 2024-01-07 ENCOUNTER — Other Ambulatory Visit: Payer: Self-pay | Admitting: Internal Medicine

## 2024-01-07 DIAGNOSIS — I129 Hypertensive chronic kidney disease with stage 1 through stage 4 chronic kidney disease, or unspecified chronic kidney disease: Secondary | ICD-10-CM

## 2024-01-12 ENCOUNTER — Encounter: Payer: Self-pay | Admitting: *Deleted

## 2024-01-14 DIAGNOSIS — F4323 Adjustment disorder with mixed anxiety and depressed mood: Secondary | ICD-10-CM | POA: Diagnosis not present

## 2024-01-20 ENCOUNTER — Other Ambulatory Visit: Payer: Self-pay | Admitting: Obstetrics and Gynecology

## 2024-01-20 ENCOUNTER — Ambulatory Visit (INDEPENDENT_AMBULATORY_CARE_PROVIDER_SITE_OTHER): Admitting: Obstetrics and Gynecology

## 2024-01-20 ENCOUNTER — Encounter: Payer: Self-pay | Admitting: Obstetrics and Gynecology

## 2024-01-20 VITALS — BP 130/82 | HR 67 | Ht <= 58 in | Wt 174.6 lb

## 2024-01-20 DIAGNOSIS — Z01818 Encounter for other preprocedural examination: Secondary | ICD-10-CM

## 2024-01-20 MED ORDER — POLYETHYLENE GLYCOL 3350 17 GM/SCOOP PO POWD: 17.0000 g | Freq: Every day | ORAL | 0 refills | Status: AC

## 2024-01-20 MED ORDER — OXYCODONE HCL 5 MG PO TABS: 5.0000 mg | ORAL_TABLET | ORAL | 0 refills | Status: DC | PRN

## 2024-01-20 MED ORDER — ACETAMINOPHEN 500 MG PO TABS: 500.0000 mg | ORAL_TABLET | Freq: Four times a day (QID) | ORAL | 0 refills | Status: DC | PRN

## 2024-01-20 MED ORDER — IBUPROFEN 600 MG PO TABS: 600.0000 mg | ORAL_TABLET | Freq: Four times a day (QID) | ORAL | 0 refills | Status: AC | PRN

## 2024-01-20 NOTE — Progress Notes (Signed)
 [facility] Pre-Operative Exam  Subjective Chief Complaint: Cynthia Barton presents for a preoperative encounter.   History of Present Illness: Cynthia Barton is a 78 y.o. female who presents for preoperative visit.  She is scheduled to undergo Exam under anesthesia, robotic assisted total laparoscopic hysterectomy, bilateral salpingo-oophorectomy, sacrocolpopexy, cystoscopy, perineorrhaphy, possible posterior repair.  on 02/02/24.  Her symptoms include pelvic organ prolapse, and she was was found to have Stage IV anterior, Stage IV posterior, Stage IV apical prolapse.   Urodynamics showed: 1. Sensation was normal; capacity was reduced 2. Stress Incontinence was demonstrated at normal pressures; 3. Detrusor Overactivity was demonstrated with leakage. 4. Emptying was dysfunctional with a elevated PVR(425 with Uroflow, 225 with Micturition study), a sustained detrusor contraction not present,  abdominal straining not present, normal urethral sphincter activity on EMG.  Past Medical History:  Diagnosis Date   Bilateral carpal tunnel syndrome 02/23/2020   Breast cancer (HCC) 09/07/2014   ER+/PR+/Her2- right breast   Breast cancer of lower-outer quadrant of right female breast (HCC) 09/06/2014   Hyperlipidemia    Hypertension    Radiation 11/10/14-12/09/14   Right Breast   Uterine prolapse 2016   Wears dentures    top   Wears glasses      Past Surgical History:  Procedure Laterality Date   BREAST SURGERY  1980   rt br bx-no anesth   COLONOSCOPY  07/27/12   mild diverticulosis   lumpectomy right breast Right 09/30/14   TUBAL LIGATION      has no known allergies.   Family History  Problem Relation Age of Onset   Melanoma Mother    Heart disease Father    Kidney failure Sister 47   Prostate cancer Brother    Heart disease Maternal Uncle    Colon cancer Neg Hx    Esophageal cancer Neg Hx    Stomach cancer Neg Hx    Rectal cancer Neg Hx    Uterine cancer Neg Hx    Bladder  Cancer Neg Hx    Renal cancer Neg Hx     Social History   Tobacco Use   Smoking status: Never   Smokeless tobacco: Never  Vaping Use   Vaping status: Never Used  Substance Use Topics   Alcohol use: Not Currently    Alcohol/week: 0.0 standard drinks of alcohol   Drug use: No     Review of Systems was negative for a full 10 system review except as noted in the History of Present Illness.   Current Outpatient Medications:    acetaminophen  (TYLENOL ) 500 MG tablet, Take 1 tablet (500 mg total) by mouth every 6 (six) hours as needed (pain)., Disp: 30 tablet, Rfl: 0   amLODipine  (NORVASC ) 2.5 MG tablet, Take 1 tablet by mouth once daily, Disp: 90 tablet, Rfl: 2   Black Elderberry,Berry-Flower, 575 MG CAPS, Take 1 capsule by mouth daily., Disp: , Rfl:    Cholecalciferol (VITAMIN D3) 125 MCG (5000 UT) CAPS, Take by mouth., Disp: , Rfl:    ibuprofen  (ADVIL ) 600 MG tablet, Take 1 tablet (600 mg total) by mouth every 6 (six) hours as needed., Disp: 30 tablet, Rfl: 0   lisinopril  (ZESTRIL ) 20 MG tablet, Take 1 tablet by mouth once daily, Disp: 90 tablet, Rfl: 2   Multiple Vitamins-Minerals (MULTIVITAMIN WITH MINERALS) tablet, Take 1 tablet by mouth daily., Disp: , Rfl:    oxyCODONE  (OXY IR/ROXICODONE ) 5 MG immediate release tablet, Take 1 tablet (5 mg total) by mouth every 4 (four)  hours as needed for severe pain (pain score 7-10)., Disp: 15 tablet, Rfl: 0   polyethylene glycol powder (GLYCOLAX /MIRALAX ) 17 GM/SCOOP powder, Take 17 g by mouth daily. Drink 17g (1 scoop) dissolved in water per day., Disp: 255 g, Rfl: 0   rosuvastatin  (CRESTOR ) 5 MG tablet, TAKE 1 TABLET EVERY DAY, Disp: 90 tablet, Rfl: 3   Objective Vitals:   01/20/24 0814  BP: 130/82  Pulse: 67    Gen: NAD CV: S1 S2 RRR Lungs: Clear to auscultation bilaterally Abd: soft, nontender   Previous Pelvic Exam showed: POP-Q   3                                            Aa   4.5                                            Ba   4.5                                              C    4.5                                            Gh   3.5                                            Pb   7                                            tvl    2                                            Ap   5                                            Bp   -2                                    Assessment/ Plan  Assessment: The patient is a 78 y.o. year old scheduled to undergo Exam under anesthesia, robotic assisted total laparoscopic hysterectomy, bilateral salpingo-oophorectomy, sacrocolpopexy, cystoscopy, perineorrhaphy, possible posterior repair. . Verbal consent was obtained for these procedures.  Plan: General Surgical Consent: The patient has previously been counseled on alternative treatments, and the decision by the patient and provider was to proceed with the procedure listed above.  For all procedures, there are risks of bleeding, infection, damage to surrounding organs including  but not limited to bowel, bladder, blood vessels, ureters and nerves, and need for further surgery if an injury were to occur. These risks are all low with minimally invasive surgery.   There are risks of numbness and weakness at any body site or buttock/rectal pain.  It is possible that baseline pain can be worsened by surgery, either with or without mesh. If surgery is vaginal, there is also a low risk of possible conversion to laparoscopy or open abdominal incision where indicated. Very rare risks include blood transfusion, blood clot, heart attack, pneumonia, or death.   There is also a risk of short-term postoperative urinary retention with need to use a catheter. About half of patients need to go home from surgery with a catheter, which is then later removed in the office. The risk of long-term need for a catheter is very low. There is also a risk of worsening of overactive bladder.   Prolapse (with or without mesh): Risk factors  for surgical failure  include things that put pressure on your pelvis and the surgical repair, including obesity, chronic cough, and heavy lifting or straining (including lifting children or adults, straining on the toilet, or lifting heavy objects such as furniture or anything weighing >25 lbs. Risks of recurrence is 20-30% with vaginal native tissue repair and a less than 10% with sacrocolpopexy with mesh.    Sacrocolpopexy: Mesh implants may provide more prolapse support, but do have some unique risks to consider. It is important to understand that mesh is permanent and cannot be easily removed. Risks of abdominal sacrocolpopexy mesh include mesh exposure (~3-6%), painful intercourse (recent studies show lower rates after surgery compared to before, with ~5-8% risk of new onset), and very rare risks of bowel or bladder injury or infection (<1%). The risk of mesh exposure is more likely in a woman with risks for poor healing (prior radiation, poorly controlled diabetes, or immunocompromised). The risk of new or worsened chronic pain after mesh implant is more common in women with baseline chronic pain and/or poorly controlled anxiety or depression. There is an FDA safety notification on vaginal mesh procedures for prolapse but NOT abdominal mesh procedures and therefore does not apply to your surgery. We have extensive experience and training with mesh placement and we have close postoperative follow up to identify any potential complications from mesh.    We discussed consent for blood products. Risks for blood transfusion include allergic reactions, other reactions that can affect different body organs and managed accordingly, transmission of infectious diseases such as HIV or Hepatitis. However, the blood is screened. Patient consents for blood products.  Pre-operative instructions:  She was instructed to not take Aspirin/NSAIDs x 7days prior to surgery. Antibiotic prophylaxis was ordered as  indicated.  Catheter use: Patient will go home with foley if needed after post-operative voiding trial.  Post-operative instructions:  She was provided with specific post-operative instructions, including precautions and signs/symptoms for which we would recommend contacting us , in addition to daytime and after-hours contact phone numbers. This was provided on a handout.   Post-operative medications: Prescriptions for motrin , tylenol , miralax , and oxycodone  were sent to her pharmacy. Discussed using ibuprofen  and tylenol  on a schedule to limit use of narcotics.   Laboratory testing:  We will check labs: CBC and Type and Screen  Preoperative clearance:  She does not require surgical clearance.    Post-operative follow-up:  A post-operative appointment will be made for 6 weeks from the date of surgery. If she needs a  post-operative nurse visit for a voiding trial, that will be set up after she leaves the hospital.    Patient will call the clinic or use MyChart should anything change or any new issues arise.   Don Giarrusso G Keirston Saephanh, NP

## 2024-01-20 NOTE — Patient Instructions (Signed)
 No yard work after surgery and no washing the car!

## 2024-01-20 NOTE — H&P (Signed)
 [facility] H&P  Subjective Chief Complaint: Cynthia Barton presents for a preoperative encounter.   History of Present Illness: Cynthia Barton is a 78 y.o. female who presents for preoperative visit.  She is scheduled to undergo Exam under anesthesia, robotic assisted total laparoscopic hysterectomy, bilateral salpingo-oophorectomy, sacrocolpopexy, cystoscopy, perineorrhaphy, possible posterior repair.  on 02/02/24.  Her symptoms include pelvic organ prolapse, and she was was found to have Stage IV anterior, Stage IV posterior, Stage IV apical prolapse.   Urodynamics showed: 1. Sensation was normal; capacity was reduced 2. Stress Incontinence was demonstrated at normal pressures; 3. Detrusor Overactivity was demonstrated with leakage. 4. Emptying was dysfunctional with a elevated PVR(425 with Uroflow, 225 with Micturition study), a sustained detrusor contraction not present,  abdominal straining not present, normal urethral sphincter activity on EMG.  Past Medical History:  Diagnosis Date   Bilateral carpal tunnel syndrome 02/23/2020   Breast cancer (HCC) 09/07/2014   ER+/PR+/Her2- right breast   Breast cancer of lower-outer quadrant of right female breast (HCC) 09/06/2014   Hyperlipidemia    Hypertension    Radiation 11/10/14-12/09/14   Right Breast   Uterine prolapse 2016   Wears dentures    top   Wears glasses      Past Surgical History:  Procedure Laterality Date   BREAST SURGERY  1980   rt br bx-no anesth   COLONOSCOPY  07/27/12   mild diverticulosis   lumpectomy right breast Right 09/30/14   TUBAL LIGATION      has no known allergies.   Family History  Problem Relation Age of Onset   Melanoma Mother    Heart disease Father    Kidney failure Sister 96   Prostate cancer Brother    Heart disease Maternal Uncle    Colon cancer Neg Hx    Esophageal cancer Neg Hx    Stomach cancer Neg Hx    Rectal cancer Neg Hx    Uterine cancer Neg Hx    Bladder Cancer Neg Hx     Renal cancer Neg Hx     Social History   Tobacco Use   Smoking status: Never   Smokeless tobacco: Never  Vaping Use   Vaping status: Never Used  Substance Use Topics   Alcohol use: Not Currently    Alcohol/week: 0.0 standard drinks of alcohol   Drug use: No     Review of Systems was negative for a full 10 system review except as noted in the History of Present Illness.  No current facility-administered medications for this encounter.  Current Outpatient Medications:    acetaminophen  (TYLENOL ) 500 MG tablet, Take 1 tablet (500 mg total) by mouth every 6 (six) hours as needed (pain)., Disp: 30 tablet, Rfl: 0   amLODipine  (NORVASC ) 2.5 MG tablet, Take 1 tablet by mouth once daily, Disp: 90 tablet, Rfl: 2   Black Elderberry,Berry-Flower, 575 MG CAPS, Take 1 capsule by mouth daily., Disp: , Rfl:    Cholecalciferol (VITAMIN D3) 125 MCG (5000 UT) CAPS, Take by mouth., Disp: , Rfl:    ibuprofen  (ADVIL ) 600 MG tablet, Take 1 tablet (600 mg total) by mouth every 6 (six) hours as needed., Disp: 30 tablet, Rfl: 0   lisinopril  (ZESTRIL ) 20 MG tablet, Take 1 tablet by mouth once daily, Disp: 90 tablet, Rfl: 2   Multiple Vitamins-Minerals (MULTIVITAMIN WITH MINERALS) tablet, Take 1 tablet by mouth daily., Disp: , Rfl:    oxyCODONE  (OXY IR/ROXICODONE ) 5 MG immediate release tablet, Take 1 tablet (5 mg  total) by mouth every 4 (four) hours as needed for severe pain (pain score 7-10)., Disp: 15 tablet, Rfl: 0   polyethylene glycol powder (GLYCOLAX /MIRALAX ) 17 GM/SCOOP powder, Take 17 g by mouth daily. Drink 17g (1 scoop) dissolved in water per day., Disp: 255 g, Rfl: 0   rosuvastatin  (CRESTOR ) 5 MG tablet, TAKE 1 TABLET EVERY DAY, Disp: 90 tablet, Rfl: 3   Objective There were no vitals filed for this visit.   Gen: NAD CV: S1 S2 RRR Lungs: Clear to auscultation bilaterally Abd: soft, nontender   Previous Pelvic Exam showed: POP-Q   3                                            Aa    4.5                                           Ba   4.5                                              C    4.5                                            Gh   3.5                                            Pb   7                                            tvl    2                                            Ap   5                                            Bp   -2                                    Assessment/ Plan  Assessment: The patient is a 78 y.o. year old scheduled to undergo Exam under anesthesia, robotic assisted total laparoscopic hysterectomy, bilateral salpingo-oophorectomy, sacrocolpopexy, cystoscopy, perineorrhaphy, possible posterior repair. . Verbal consent was obtained for these procedures.

## 2024-01-22 ENCOUNTER — Encounter: Payer: Self-pay | Admitting: Internal Medicine

## 2024-01-22 ENCOUNTER — Ambulatory Visit: Admitting: Internal Medicine

## 2024-01-22 VITALS — BP 110/74 | HR 81 | Temp 98.4°F | Ht <= 58 in | Wt 174.8 lb

## 2024-01-22 DIAGNOSIS — E7800 Pure hypercholesterolemia, unspecified: Secondary | ICD-10-CM | POA: Diagnosis not present

## 2024-01-22 DIAGNOSIS — N813 Complete uterovaginal prolapse: Secondary | ICD-10-CM

## 2024-01-22 DIAGNOSIS — Z6837 Body mass index (BMI) 37.0-37.9, adult: Secondary | ICD-10-CM

## 2024-01-22 DIAGNOSIS — N182 Chronic kidney disease, stage 2 (mild): Secondary | ICD-10-CM

## 2024-01-22 DIAGNOSIS — E66812 Obesity, class 2: Secondary | ICD-10-CM

## 2024-01-22 DIAGNOSIS — I129 Hypertensive chronic kidney disease with stage 1 through stage 4 chronic kidney disease, or unspecified chronic kidney disease: Secondary | ICD-10-CM

## 2024-01-22 MED ORDER — AMLODIPINE BESYLATE 2.5 MG PO TABS: ORAL_TABLET | ORAL | 2 refills | Status: AC

## 2024-01-22 NOTE — Progress Notes (Signed)
 I,Cynthia Barton, CMA,acting as a Neurosurgeon for Cynthia LOISE Slocumb, MD.,have documented all relevant documentation on the behalf of Cynthia LOISE Slocumb, MD,as directed by  Cynthia LOISE Slocumb, MD while in the presence of Cynthia LOISE Slocumb, MD.  Subjective:  Patient ID: Cynthia Barton , female    DOB: 03/27/1946 , 78 y.o.   MRN: [id number]  Chief Complaint  Patient presents with   Hypertension    Patient presents today for bp & cholesterol follow up. She reports compliance with medications. She is accompanied by her daughter. Denies headache, chest pain & sob.  She has a black head on her back.     Hyperlipidemia    HPI Discussed the use of AI scribe software for clinical note transcription with the patient, who gave verbal consent to proceed.  History of Present Illness Cynthia Barton is a 78 year old female who presents for a blood pressure check and pre-operative evaluation.  She is accompanied by her daughter today.  She is scheduled for a hysterectomy due to a prolapsed uterus, which is causing urinary incontinence when standing. The planned procedure includes removal of the uterus, tubes, and ovaries, and the placement of a mesh to address the prolapse.  She is currently taking amlodipine  and lisinopril  for hypertension, and a cholesterol medication. Her amlodipine  prescription is due for a refill. There is no current instruction to stop any medications before the surgery, although she was advised to stop taking calcium  in the past.  In terms of her daily activities, she does not engage in much physical activity, although she has been doing some yard work and car washing. She had cereal for breakfast after missing dinner the previous night due to falling asleep.   Hypertension This is a chronic problem. The current episode started more than 1 year ago. The problem has been gradually improving since onset. The problem is controlled. Pertinent negatives include no blurred vision. Risk factors for  coronary artery disease include dyslipidemia, post-menopausal state, obesity and sedentary lifestyle. The current treatment provides moderate improvement. Compliance problems include exercise.  Hypertensive end-organ damage includes kidney disease.     Past Medical History:  Diagnosis Date   Bilateral carpal tunnel syndrome 02/23/2020   Breast cancer (HCC) 09/07/2014   ER+/PR+/Her2- right breast   Breast cancer of lower-outer quadrant of right female breast (HCC) 09/06/2014   Hyperlipidemia    Hypertension    Radiation 11/10/14-12/09/14   Right Breast   Uterine prolapse 2016   Wears dentures    top   Wears glasses      Family History  Problem Relation Age of Onset   Melanoma Mother    Heart disease Father    Kidney failure Sister 47   Prostate cancer Brother    Heart disease Maternal Uncle    Colon cancer Neg Hx    Esophageal cancer Neg Hx    Stomach cancer Neg Hx    Rectal cancer Neg Hx    Uterine cancer Neg Hx    Bladder Cancer Neg Hx    Renal cancer Neg Hx      Current Outpatient Medications:    acetaminophen  (TYLENOL ) 500 MG tablet, Take 1 tablet (500 mg total) by mouth every 6 (six) hours as needed (pain). (Patient not taking: Reported on 01/23/2024), Disp: 30 tablet, Rfl: 0   Black Elderberry,Berry-Flower, 575 MG CAPS, Take 1 capsule by mouth daily., Disp: , Rfl:    Cholecalciferol (VITAMIN D3) 125 MCG (5000 UT) CAPS, Take by mouth., Disp: ,  Rfl:    ibuprofen  (ADVIL ) 600 MG tablet, Take 1 tablet (600 mg total) by mouth every 6 (six) hours as needed. (Patient not taking: Reported on 01/23/2024), Disp: 30 tablet, Rfl: 0   lisinopril  (ZESTRIL ) 20 MG tablet, Take 1 tablet by mouth once daily, Disp: 90 tablet, Rfl: 2   Multiple Vitamins-Minerals (MULTIVITAMIN WITH MINERALS) tablet, Take 1 tablet by mouth daily., Disp: , Rfl:    oxyCODONE  (OXY IR/ROXICODONE ) 5 MG immediate release tablet, Take 1 tablet (5 mg total) by mouth every 4 (four) hours as needed for severe pain (pain score  7-10). (Patient not taking: Reported on 01/23/2024), Disp: 15 tablet, Rfl: 0   rosuvastatin  (CRESTOR ) 5 MG tablet, TAKE 1 TABLET EVERY DAY, Disp: 90 tablet, Rfl: 3   amLODipine  (NORVASC ) 2.5 MG tablet, Take 1 tablet by mouth once daily, Disp: 90 tablet, Rfl: 2   No Known Allergies   Review of Systems  Constitutional: Negative.   HENT: Negative.    Eyes:  Negative for blurred vision.  Respiratory: Negative.    Cardiovascular: Negative.   Gastrointestinal: Negative.   Neurological: Negative.   Psychiatric/Behavioral: Negative.       Today's Vitals   01/22/24 1040  BP: 110/74  Pulse: 81  Temp: 98.4 F (36.9 C)  SpO2: 98%  Weight: 174 lb 12.8 oz (79.3 kg)  Height: 4' 9 (1.448 m)   Body mass index is 37.83 kg/m.  Wt Readings from Last 3 Encounters:  01/22/24 174 lb 12.8 oz (79.3 kg)  01/20/24 174 lb 9.6 oz (79.2 kg)  10/20/23 172 lb (78 kg)     Objective:  Physical Exam Vitals and nursing note reviewed.  Constitutional:      Appearance: Normal appearance. She is obese.  HENT:     Head: Normocephalic and atraumatic.  Eyes:     Extraocular Movements: Extraocular movements intact.  Cardiovascular:     Rate and Rhythm: Normal rate and regular rhythm.     Heart sounds: Normal heart sounds.  Pulmonary:     Effort: Pulmonary effort is normal.     Breath sounds: Normal breath sounds.  Skin:    General: Skin is warm.  Neurological:     General: No focal deficit present.     Mental Status: She is alert.  Psychiatric:        Mood and Affect: Mood normal.        Behavior: Behavior normal.         Assessment And Plan:  Benign hypertension with CKD (chronic kidney disease), stage II Assessment & Plan: Chronic, well controlled.  She will c/w lisinopril  20mg  and amlodipine  2.5mg  daily for now. Advised to take lisinopril  20mg  in AM and amlodipine  2.5mg  nightly. Reminded to follow low sodium diet. She is also encouraged to stay well hydrated. Importance of regular exercise  was also discussed with the patient.   Orders: -     CMP14+EGFR  Pure hypercholesterolemia Assessment & Plan: Chronic, currently taking rosuvastatin  5mg  daily. Encouraged to follow a heart healthy lifestyle.  - She is not fasting today, will check lipid panel at her next visit.  Orders: -     CMP14+EGFR  Uterovaginal prolapse, complete Assessment & Plan: Prolapsed uterus requiring surgical intervention. - Scheduled for hysterectomy with removal of uterus, tubes, and ovaries. - Plan includes placement of mesh to address urinary incontinence when standing. - Surgery to be performed by Dr. Marilynne. - Recovery expected to take six weeks.   Class 2 severe obesity due to  excess calories with serious comorbidity and body mass index (BMI) of 37.0 to 37.9 in adult [facility]) Assessment & Plan: She is encouraged to strive for BMI less than 30 to decrease cardiac risk. Advised to aim for at least 150 minutes of exercise per week.    Other orders -     amLODIPine  Besylate; Take 1 tablet by mouth once daily  Dispense: 90 tablet; Refill: 2   Return if symptoms worsen or fail to improve.  Patient was given opportunity to ask questions. Patient verbalized understanding of the plan and was able to repeat key elements of the plan. All questions were answered to their satisfaction.   I, Cynthia LOISE Slocumb, MD, have reviewed all documentation for this visit. The documentation on 01/22/24 for the exam, diagnosis, procedures, and orders are all accurate and complete.   IF YOU HAVE BEEN REFERRED TO A SPECIALIST, IT MAY TAKE 1-2 WEEKS TO SCHEDULE/PROCESS THE REFERRAL. IF YOU HAVE NOT HEARD FROM US /SPECIALIST IN TWO WEEKS, PLEASE GIVE US  A CALL AT [phone].   THE PATIENT IS ENCOURAGED TO PRACTICE SOCIAL DISTANCING DUE TO THE COVID-19 PANDEMIC.

## 2024-01-22 NOTE — Patient Instructions (Signed)
 Hypertension, Adult Hypertension is another name for high blood pressure. High blood pressure forces your heart to work harder to pump blood. This can cause problems over time. There are two numbers in a blood pressure reading. There is a top number (systolic) over a bottom number (diastolic). It is best to have a blood pressure that is below 120/80. What are the causes? The cause of this condition is not known. Some other conditions can lead to high blood pressure. What increases the risk? Some lifestyle factors can make you more likely to develop high blood pressure: Smoking. Not getting enough exercise or physical activity. Being overweight. Having too much fat, sugar, calories, or salt (sodium) in your diet. Drinking too much alcohol. Other risk factors include: Having any of these conditions: Heart disease. Diabetes. High cholesterol. Kidney disease. Obstructive sleep apnea. Having a family history of high blood pressure and high cholesterol. Age. The risk increases with age. Stress. What are the signs or symptoms? High blood pressure may not cause symptoms. Very high blood pressure (hypertensive crisis) may cause: Headache. Fast or uneven heartbeats (palpitations). Shortness of breath. Nosebleed. Vomiting or feeling like you may vomit (nauseous). Changes in how you see. Very bad chest pain. Feeling dizzy. Seizures. How is this treated? This condition is treated by making healthy lifestyle changes, such as: Eating healthy foods. Exercising more. Drinking less alcohol. Your doctor may prescribe medicine if lifestyle changes do not help enough and if: Your top number is above 130. Your bottom number is above 80. Your personal target blood pressure may vary. Follow these instructions at home: Eating and drinking  If told, follow the DASH eating plan. To follow this plan: Fill one half of your plate at each meal with fruits and vegetables. Fill one fourth of your plate  at each meal with whole grains. Whole grains include whole-wheat pasta, brown rice, and whole-grain bread. Eat or drink low-fat dairy products, such as skim milk or low-fat yogurt. Fill one fourth of your plate at each meal with low-fat (lean) proteins. Low-fat proteins include fish, chicken without skin, eggs, beans, and tofu. Avoid fatty meat, cured and processed meat, or chicken with skin. Avoid pre-made or processed food. Limit the amount of salt in your diet to less than 1,500 mg each day. Do not drink alcohol if: Your doctor tells you not to drink. You are pregnant, may be pregnant, or are planning to become pregnant. If you drink alcohol: Limit how much you have to: 0-1 drink a day for women. 0-2 drinks a day for men. Know how much alcohol is in your drink. In the [place] one drink equals one 12 oz bottle of beer (355 mL), one 5 oz glass of wine (148 mL), or one 1 oz glass of hard liquor (44 mL). Lifestyle  Work with your doctor to stay at a healthy weight or to lose weight. Ask your doctor what the best weight is for you. Get at least 30 minutes of exercise that causes your heart to beat faster (aerobic exercise) most days of the week. This may include walking, swimming, or biking. Get at least 30 minutes of exercise that strengthens your muscles (resistance exercise) at least 3 days a week. This may include lifting weights or doing Pilates. Do not smoke or use any products that contain nicotine or tobacco. If you need help quitting, ask your doctor. Check your blood pressure at home as told by your doctor. Keep all follow-up visits. Medicines Take over-the-counter and prescription medicines  only as told by your doctor. Follow directions carefully. Do not skip doses of blood pressure medicine. The medicine does not work as well if you skip doses. Skipping doses also puts you at risk for problems. Ask your doctor about side effects or reactions to medicines that you should watch  for. Contact a doctor if: You think you are having a reaction to the medicine you are taking. You have headaches that keep coming back. You feel dizzy. You have swelling in your ankles. You have trouble with your vision. Get help right away if: You get a very bad headache. You start to feel mixed up (confused). You feel weak or numb. You feel faint. You have very bad pain in your: Chest. Belly (abdomen). You vomit more than once. You have trouble breathing. These symptoms may be an emergency. Get help right away. Call 911. Do not wait to see if the symptoms will go away. Do not drive yourself to the hospital. Summary Hypertension is another name for high blood pressure. High blood pressure forces your heart to work harder to pump blood. For most people, a normal blood pressure is less than 120/80. Making healthy choices can help lower blood pressure. If your blood pressure does not get lower with healthy choices, you may need to take medicine. This information is not intended to replace advice given to you by your health care provider. Make sure you discuss any questions you have with your health care provider. Document Revised: 03/29/2021 Document Reviewed: 03/29/2021 Elsevier Patient Education  2024 [employer].

## 2024-01-23 LAB — CMP14+EGFR
ALT: 23 IU/L (ref 0–32)
AST: 30 IU/L (ref 0–40)
Albumin: 4 g/dL (ref 3.8–4.8)
Alkaline Phosphatase: 70 IU/L (ref 44–121)
BUN/Creatinine Ratio: 17 (ref 12–28)
BUN: 14 mg/dL (ref 8–27)
Bilirubin Total: 0.5 mg/dL (ref 0.0–1.2)
CO2: 22 mmol/L (ref 20–29)
Calcium: 10.8 mg/dL — ABNORMAL HIGH (ref 8.7–10.3)
Chloride: 105 mmol/L (ref 96–106)
Creatinine, Ser: 0.83 mg/dL (ref 0.57–1.00)
Globulin, Total: 2.8 g/dL (ref 1.5–4.5)
Glucose: 79 mg/dL (ref 70–99)
Potassium: 5.1 mmol/L (ref 3.5–5.2)
Sodium: 141 mmol/L (ref 134–144)
Total Protein: 6.8 g/dL (ref 6.0–8.5)
eGFR: 72 mL/min/1.73 (ref >59)

## 2024-01-25 ENCOUNTER — Ambulatory Visit: Payer: Self-pay | Admitting: Internal Medicine

## 2024-01-25 NOTE — Assessment & Plan Note (Signed)
 Chronic, well controlled.  She will c/w lisinopril  20mg  and amlodipine  2.5mg  daily for now. Advised to take lisinopril  20mg  in AM and amlodipine  2.5mg  nightly. Reminded to follow low sodium diet. She is also encouraged to stay well hydrated. Importance of regular exercise was also discussed with the patient.

## 2024-01-25 NOTE — Assessment & Plan Note (Signed)
 Prolapsed uterus requiring surgical intervention. - Scheduled for hysterectomy with removal of uterus, tubes, and ovaries. - Plan includes placement of mesh to address urinary incontinence when standing. - Surgery to be performed by Dr. Marilynne. - Recovery expected to take six weeks.

## 2024-01-25 NOTE — Assessment & Plan Note (Signed)
 Chronic, currently taking rosuvastatin  5mg  daily. Encouraged to follow a heart healthy lifestyle.  - She is not fasting today, will check lipid panel at her next visit.

## 2024-01-25 NOTE — Assessment & Plan Note (Signed)
 She is encouraged to strive for BMI less than 30 to decrease cardiac risk. Advised to aim for at least 150 minutes of exercise per week.

## 2024-01-27 ENCOUNTER — Encounter (HOSPITAL_COMMUNITY): Payer: Self-pay | Admitting: Obstetrics and Gynecology

## 2024-01-27 NOTE — Pre-Procedure Instructions (Addendum)
 Surgical Instructions   Your procedure is scheduled on :  Monday,  02-02-2024. Report to [facility] Main Entrance A at 5:30  A.M., then check in with the Admitting office. Any questions or running late day of surgery: call [phone]  Questions prior to your surgery date: call [phone], Monday-Friday, 8am-4pm. If you experience any cold or flu symptoms such as cough, fever, chills, shortness of breath, etc. between now and your scheduled surgery, please notify your surgeon office.    Remember:  Do not eat any food after midnight the night before your surgery.  You may have clear liquids from midnight night before surgery until 4:30 AM.  Clear liquids allowed are:  Water Carbonated Beverages Clear Tea (no milk, honey, etc.) Black Coffee Only (NO MILK, CREAM OR POWDERED CREAMER of any kind) Sport drinks, like Gatorade  NO clear liquids after 4:30 AM day of surgery.  This includes no water,  candy,  gum,  and  mints.    Take these medicines the morning of surgery with A SIPs OF WATER : Rosuvastatin  (crestor )   May take these medicines IF NEEDED:  none    One week prior to surgery, STOP taking any Aspirin (unless otherwise instructed by your surgeon) Aleve, Naproxen, Ibuprofen , Motrin , Advil , Goody's, BC's, all herbal medications, fish oil, and non-prescription vitamins.                     Do NOT Smoke (Tobacco/Vaping) and Do Not drink alcohol for 24 hours prior to your procedure.  If you use a CPAP at night, you may bring your mask/headgear for your overnight stay.   You will be asked to remove any contacts, glasses, piercing's, hearing aid's, dentures/partials prior to surgery. Please bring cases for these items if needed.    Patients discharged the day of surgery will not be allowed to drive home, and someone needs to stay with them for 24 hours.  SURGICAL WAITING ROOM VISITATION Patients may have no more than 2 support people in the waiting area - these visitors  may rotate.   Pre-op nurse will coordinate an appropriate time for 1 ADULT support person, who may not rotate, to accompany patient in pre-op.  Children under the age of 41 must have an adult with them who is not the patient and must remain in the main waiting area with an adult.  If the patient needs to stay at the hospital during part of their recovery, the visitor guidelines for inpatient rooms apply.  Please refer to the [facility] website for the visitor guidelines for any additional information.   If you received a COVID test during your pre-op visit  it is requested that you wear a mask when out in public, stay away from anyone that may not be feeling well and notify your surgeon if you develop symptoms. If you have been in contact with anyone that has tested positive in the last 10 days please notify you surgeon.      Pre-operative CHG Bathing Instructions   You can play a key role in reducing the risk of infection after surgery. Your skin needs to be as free of germs as possible. You can reduce the number of germs on your skin by washing with CHG (chlorhexidine  gluconate) soap before surgery. CHG is an antiseptic soap that kills germs and continues to kill germs even after washing.   DO NOT use if you have an allergy to chlorhexidine /CHG or antibacterial soaps. If your skin becomes  reddened or irritated, stop using the CHG and notify one of our RN day of surgery.             TAKE A SHOWER THE NIGHT BEFORE SURGERY AND THE DAY OF SURGERY    Please keep in mind the following:  DO NOT shave, including legs and underarms, 48 hours prior to surgery.   You may shave your face before/day of surgery.  Place clean sheets on your bed the night before surgery Use a clean washcloth (not used since being washed) for each shower. DO NOT sleep with pet's night before surgery.  CHG Shower Instructions:  Wash your face and private area with normal soap. If you choose to wash your hair, wash  first with your normal shampoo.  After you use shampoo/soap, rinse your hair and body thoroughly to remove shampoo/soap residue.  Turn the water OFF and apply half the bottle of CHG soap to a CLEAN washcloth.  Apply CHG soap ONLY FROM YOUR NECK DOWN TO YOUR TOES (washing for 3-5 minutes)  DO NOT use CHG soap on face, private areas, open wounds, or sores.  Pay special attention to the area where your surgery is being performed.  If you are having back surgery, having someone wash your back for you may be helpful. Wait 2 minutes after CHG soap is applied, then you may rinse off the CHG soap.  Pat dry with a clean towel  Put on clean pajamas    Additional instructions for the day of surgery: DO NOT APPLY any lotions,  powder,  oils,  deodorants (may use underarm deodorant) , cologne/  perfumes or makeup Do not wear jewelry /  piercing's/ metal/  permanent jewelry must be removed prior to surgery day of surgery. Do not wear nail polish, gel polish, artificial nails, or any other type of covering on natural finger nails (toe nails are okay) Do not bring valuables to the hospital. [facility] is not responsible for valuables/personal belongings. Put on clean/comfortable clothes.  Please brush your teeth.  Ask your nurse before applying any prescription medications to the skin.

## 2024-01-27 NOTE — Progress Notes (Signed)
 Spoke w/ via phone for pre-op interview--- pt Lab needs dos----  no       Lab results------ lab appt 01-29-2024 @ 1100 getting CBC/ BMP/ T&S Current EKG in epic/ chart dated 02-19-2023 COVID test -----patient states asymptomatic no test needed Arrive at ------- 0530 on 02-02-2024 NPO after MN NO Solid Food.  Clear liquids from MN until--- 0430 Pre-Surgery Ensure or G2: n/a  Med rec completed Medications to take morning of surgery ----- crestor  Diabetic medication ----- n/a  GLP1 agonist last dose: n/a GLP1 instructions:  Patient instructed no nail polish to be worn day of surgery Patient instructed to bring photo id and insurance card day of surgery Patient aware to have Driver (ride ) / caregiver    for 24 hours after surgery - daughter, jinny Marcy (pt had be call daughter to make lab appt since she does not drive) Patient Special Instructions ----- will pick up soap and written instructions at lab appt Pre-Op special Instructions ----- n/a  Patient verbalized understanding of instructions that were given at this phone interview. Patient denies chest pain, sob, fever, cough at the interview.

## 2024-01-29 ENCOUNTER — Encounter (HOSPITAL_COMMUNITY)
Admission: RE | Admit: 2024-01-29 | Discharge: 2024-01-29 | Disposition: A | Discharge status: Home / Self Care | Source: Ambulatory Visit | Attending: Obstetrics and Gynecology | Admitting: Obstetrics and Gynecology

## 2024-01-29 DIAGNOSIS — N182 Chronic kidney disease, stage 2 (mild): Secondary | ICD-10-CM | POA: Insufficient documentation

## 2024-01-29 DIAGNOSIS — N813 Complete uterovaginal prolapse: Secondary | ICD-10-CM | POA: Insufficient documentation

## 2024-01-29 DIAGNOSIS — I129 Hypertensive chronic kidney disease with stage 1 through stage 4 chronic kidney disease, or unspecified chronic kidney disease: Secondary | ICD-10-CM | POA: Insufficient documentation

## 2024-01-29 DIAGNOSIS — Z01812 Encounter for preprocedural laboratory examination: Secondary | ICD-10-CM | POA: Diagnosis not present

## 2024-01-29 DIAGNOSIS — Z01818 Encounter for other preprocedural examination: Secondary | ICD-10-CM

## 2024-01-29 LAB — CBC
HCT: 40.8 % (ref 36.0–46.0)
Hemoglobin: 13.2 g/dL (ref 12.0–15.0)
MCH: 28.1 pg (ref 26.0–34.0)
MCHC: 32.4 g/dL (ref 30.0–36.0)
MCV: 87 fL (ref 80.0–100.0)
Platelets: 179 K/uL (ref 150–400)
RBC: 4.69 MIL/uL (ref 3.87–5.11)
RDW: 14.1 % (ref 11.5–15.5)
WBC: 6 K/uL (ref 4.0–10.5)
nRBC: 0 % (ref 0.0–0.2)

## 2024-01-29 LAB — BASIC METABOLIC PANEL WITH GFR
Anion gap: 7 (ref 5–15)
BUN: 13 mg/dL (ref 8–23)
CO2: 27 mmol/L (ref 22–32)
Calcium: 10.4 mg/dL — ABNORMAL HIGH (ref 8.9–10.3)
Chloride: 105 mmol/L (ref 98–111)
Creatinine, Ser: 0.74 mg/dL (ref 0.44–1.00)
GFR, Estimated: >60 mL/min (ref >60)
Glucose, Bld: 102 mg/dL — ABNORMAL HIGH (ref 70–99)
Potassium: 3.9 mmol/L (ref 3.5–5.1)
Sodium: 139 mmol/L (ref 135–145)

## 2024-01-29 LAB — TYPE AND SCREEN
ABO/RH(D): O POS
Antibody Screen: NEGATIVE

## 2024-02-01 NOTE — Anesthesia Preprocedure Evaluation (Signed)
 Anesthesia Evaluation  Patient identified by MRN, date of birth, ID band Patient awake    Reviewed: Allergy & Precautions, NPO status , Patient's Chart, lab work & pertinent test results  History of Anesthesia Complications Negative for: history of anesthetic complications  Airway Mallampati: II  TM Distance: >3 FB Neck ROM: Full    Dental  (+) Edentulous Upper, Poor Dentition, Missing, Dental Advisory Given, Partial Lower   Pulmonary neg pulmonary ROS   Pulmonary exam normal breath sounds clear to auscultation       Cardiovascular hypertension, Pt. on medications Normal cardiovascular exam Rhythm:Regular Rate:Normal     Neuro/Psych negative neurological ROS  negative psych ROS   GI/Hepatic negative GI ROS, Neg liver ROS,,,  Endo/Other    Class 3 obesity  Renal/GU Renal disease     Musculoskeletal   Abdominal  (+) + obese  Peds  Hematology   Anesthesia Other Findings   Reproductive/Obstetrics                              Anesthesia Physical Anesthesia Plan  ASA: 3  Anesthesia Plan: General   Post-op Pain Management: Tylenol  PO (pre-op)*   Induction: Intravenous  PONV Risk Score and Plan: 4 or greater and Ondansetron , Dexamethasone  and Treatment may vary due to age or medical condition  Airway Management Planned: Oral ETT  Additional Equipment:   Intra-op Plan:   Post-operative Plan: Extubation in OR  Informed Consent: I have reviewed the patients History and Physical, chart, labs and discussed the procedure including the risks, benefits and alternatives for the proposed anesthesia with the patient or authorized representative who has indicated his/her understanding and acceptance.     Dental advisory given  Plan Discussed with: CRNA  Anesthesia Plan Comments:          Anesthesia Quick Evaluation

## 2024-02-02 ENCOUNTER — Ambulatory Visit (HOSPITAL_COMMUNITY): Payer: Self-pay | Admitting: Anesthesiology

## 2024-02-02 ENCOUNTER — Other Ambulatory Visit: Payer: Self-pay

## 2024-02-02 ENCOUNTER — Ambulatory Visit (HOSPITAL_COMMUNITY)
Admission: RE | Admit: 2024-02-02 | Discharge: 2024-02-02 | Disposition: A | Discharge status: Home / Self Care | Attending: Obstetrics and Gynecology | Admitting: Obstetrics and Gynecology

## 2024-02-02 ENCOUNTER — Ambulatory Visit (HOSPITAL_COMMUNITY)

## 2024-02-02 ENCOUNTER — Encounter (HOSPITAL_COMMUNITY): Payer: Self-pay | Admitting: Obstetrics and Gynecology

## 2024-02-02 ENCOUNTER — Encounter (HOSPITAL_COMMUNITY)
Admission: RE | Disposition: A | Discharge status: Home / Self Care | Payer: Self-pay | Source: Home / Self Care | Attending: Obstetrics and Gynecology

## 2024-02-02 ENCOUNTER — Ambulatory Visit (HOSPITAL_BASED_OUTPATIENT_CLINIC_OR_DEPARTMENT_OTHER): Payer: Self-pay | Admitting: Anesthesiology

## 2024-02-02 DIAGNOSIS — I10 Essential (primary) hypertension: Secondary | ICD-10-CM | POA: Insufficient documentation

## 2024-02-02 DIAGNOSIS — Z8249 Family history of ischemic heart disease and other diseases of the circulatory system: Secondary | ICD-10-CM | POA: Insufficient documentation

## 2024-02-02 DIAGNOSIS — D252 Subserosal leiomyoma of uterus: Secondary | ICD-10-CM | POA: Insufficient documentation

## 2024-02-02 DIAGNOSIS — N812 Incomplete uterovaginal prolapse: Secondary | ICD-10-CM | POA: Diagnosis not present

## 2024-02-02 DIAGNOSIS — N83201 Unspecified ovarian cyst, right side: Secondary | ICD-10-CM | POA: Insufficient documentation

## 2024-02-02 DIAGNOSIS — I129 Hypertensive chronic kidney disease with stage 1 through stage 4 chronic kidney disease, or unspecified chronic kidney disease: Secondary | ICD-10-CM

## 2024-02-02 DIAGNOSIS — N888 Other specified noninflammatory disorders of cervix uteri: Secondary | ICD-10-CM | POA: Diagnosis not present

## 2024-02-02 DIAGNOSIS — N83209 Unspecified ovarian cyst, unspecified side: Secondary | ICD-10-CM | POA: Insufficient documentation

## 2024-02-02 DIAGNOSIS — N182 Chronic kidney disease, stage 2 (mild): Secondary | ICD-10-CM | POA: Diagnosis not present

## 2024-02-02 DIAGNOSIS — N838 Other noninflammatory disorders of ovary, fallopian tube and broad ligament: Secondary | ICD-10-CM | POA: Diagnosis not present

## 2024-02-02 DIAGNOSIS — D250 Submucous leiomyoma of uterus: Secondary | ICD-10-CM | POA: Insufficient documentation

## 2024-02-02 DIAGNOSIS — D251 Intramural leiomyoma of uterus: Secondary | ICD-10-CM | POA: Insufficient documentation

## 2024-02-02 DIAGNOSIS — E66813 Obesity, class 3: Secondary | ICD-10-CM | POA: Diagnosis not present

## 2024-02-02 DIAGNOSIS — E7800 Pure hypercholesterolemia, unspecified: Secondary | ICD-10-CM

## 2024-02-02 DIAGNOSIS — T797XXA Traumatic subcutaneous emphysema, initial encounter: Secondary | ICD-10-CM | POA: Diagnosis not present

## 2024-02-02 DIAGNOSIS — N393 Stress incontinence (female) (male): Secondary | ICD-10-CM | POA: Diagnosis not present

## 2024-02-02 DIAGNOSIS — N813 Complete uterovaginal prolapse: Secondary | ICD-10-CM

## 2024-02-02 DIAGNOSIS — Z79899 Other long term (current) drug therapy: Secondary | ICD-10-CM | POA: Diagnosis not present

## 2024-02-02 DIAGNOSIS — N83202 Unspecified ovarian cyst, left side: Secondary | ICD-10-CM | POA: Diagnosis not present

## 2024-02-02 DIAGNOSIS — Z6836 Body mass index (BMI) 36.0-36.9, adult: Secondary | ICD-10-CM | POA: Insufficient documentation

## 2024-02-02 DIAGNOSIS — Z01818 Encounter for other preprocedural examination: Secondary | ICD-10-CM

## 2024-02-02 HISTORY — PX: ROBOTIC ASSISTED TOTAL HYSTERECTOMY WITH BILATERAL SALPINGO OOPHERECTOMY: SHX6086

## 2024-02-02 HISTORY — DX: Unspecified hemorrhoids: K64.9

## 2024-02-02 HISTORY — DX: Overactive bladder: N32.81

## 2024-02-02 HISTORY — PX: ROBOTIC ASSISTED LAPAROSCOPIC SACROCOLPOPEXY: SHX5388

## 2024-02-02 HISTORY — DX: Retention of urine, unspecified: R33.9

## 2024-02-02 HISTORY — DX: Chronic kidney disease, stage 2 (mild): N18.2

## 2024-02-02 HISTORY — DX: Postmenopausal bleeding: N95.0

## 2024-02-02 HISTORY — DX: Diverticulosis of large intestine without perforation or abscess without bleeding: K57.30

## 2024-02-02 HISTORY — PX: CYSTOSCOPY: SHX5120

## 2024-02-02 HISTORY — PX: PERINEOPLASTY: SHX2218

## 2024-02-02 HISTORY — DX: Stress incontinence (female) (male): N39.3

## 2024-02-02 HISTORY — DX: Prediabetes: R73.03

## 2024-02-02 HISTORY — DX: Presence of external hearing-aid: Z97.4

## 2024-02-02 HISTORY — DX: Presence of dental prosthetic device (complete) (partial): Z97.2

## 2024-02-02 LAB — ABO/RH: ABO/RH(D): O POS

## 2024-02-02 SURGERY — HYSTERECTOMY, TOTAL, ROBOT-ASSISTED, LAPAROSCOPIC, WITH BILATERAL SALPINGO-OOPHORECTOMY
Anesthesia: General | Site: Perineum

## 2024-02-02 MED ORDER — PHENAZOPYRIDINE HCL 100 MG PO TABS
ORAL_TABLET | ORAL | Status: AC
  Filled 2024-02-02: qty 2

## 2024-02-02 MED ORDER — PHENYLEPHRINE 80 MCG/ML (10ML) SYRINGE FOR IV PUSH (FOR BLOOD PRESSURE SUPPORT)
PREFILLED_SYRINGE | INTRAVENOUS | Status: DC | PRN
Start: 2024-02-02 — End: 2024-02-02
  Administered 2024-02-02: 160 ug via INTRAVENOUS
  Administered 2024-02-02 (×2): 80 ug via INTRAVENOUS
  Administered 2024-02-02: 160 ug via INTRAVENOUS

## 2024-02-02 MED ORDER — ONDANSETRON HCL 4 MG/2ML IJ SOLN
INTRAMUSCULAR | Status: DC | PRN
  Administered 2024-02-02 (×2): 4 mg via INTRAVENOUS

## 2024-02-02 MED ORDER — CHLORHEXIDINE GLUCONATE 0.12 % MT SOLN
OROMUCOSAL | Status: DC
Start: 2024-02-02 — End: 2024-02-02
  Filled 2024-02-02: qty 15

## 2024-02-02 MED ORDER — LIDOCAINE 2% (20 MG/ML) 5 ML SYRINGE
INTRAMUSCULAR | Status: DC | PRN
Start: 2024-02-02 — End: 2024-02-02
  Administered 2024-02-02 (×2): 50 mg via INTRAVENOUS

## 2024-02-02 MED ORDER — PHENYLEPHRINE 80 MCG/ML (10ML) SYRINGE FOR IV PUSH (FOR BLOOD PRESSURE SUPPORT)
PREFILLED_SYRINGE | INTRAVENOUS | Status: AC
Start: 2024-02-02 — End: 2024-02-02
  Filled 2024-02-02: qty 10

## 2024-02-02 MED ORDER — KETOROLAC TROMETHAMINE 30 MG/ML IJ SOLN
INTRAMUSCULAR | Status: DC | PRN
Start: 2024-02-02 — End: 2024-02-02
  Administered 2024-02-02 (×2): 15 mg via INTRAVENOUS

## 2024-02-02 MED ORDER — BUPIVACAINE HCL (PF) 0.25 % IJ SOLN
INTRAMUSCULAR | Status: AC
  Filled 2024-02-02: qty 30

## 2024-02-02 MED ORDER — METRONIDAZOLE 500 MG/100ML IV SOLN
500.0000 mg | Freq: Once | INTRAVENOUS | Status: AC
  Administered 2024-02-02 (×2): 500 mg via INTRAVENOUS

## 2024-02-02 MED ORDER — ONDANSETRON HCL 4 MG/2ML IJ SOLN
INTRAMUSCULAR | Status: AC
  Filled 2024-02-02: qty 2

## 2024-02-02 MED ORDER — ROCURONIUM BROMIDE 10 MG/ML (PF) SYRINGE
PREFILLED_SYRINGE | INTRAVENOUS | Status: AC
  Filled 2024-02-02: qty 10

## 2024-02-02 MED ORDER — LIDOCAINE-EPINEPHRINE 1 %-1:100000 IJ SOLN
INTRAMUSCULAR | Status: AC
  Filled 2024-02-02: qty 1

## 2024-02-02 MED ORDER — ROCURONIUM BROMIDE 10 MG/ML (PF) SYRINGE
PREFILLED_SYRINGE | INTRAVENOUS | Status: DC | PRN
  Administered 2024-02-02: 50 mg via INTRAVENOUS
  Administered 2024-02-02 (×4): 10 mg via INTRAVENOUS
  Administered 2024-02-02: 50 mg via INTRAVENOUS

## 2024-02-02 MED ORDER — HYDROMORPHONE HCL 1 MG/ML IJ SOLN
INTRAMUSCULAR | Status: AC
  Filled 2024-02-02: qty 1

## 2024-02-02 MED ORDER — ORAL CARE MOUTH RINSE: 15.0000 mL | Freq: Once | OROMUCOSAL | Status: AC

## 2024-02-02 MED ORDER — PHENAZOPYRIDINE HCL 100 MG PO TABS
200.0000 mg | ORAL_TABLET | ORAL | Status: AC
  Administered 2024-02-02 (×2): 200 mg via ORAL

## 2024-02-02 MED ORDER — DEXAMETHASONE SODIUM PHOSPHATE 10 MG/ML IJ SOLN
INTRAMUSCULAR | Status: AC
  Filled 2024-02-02: qty 1

## 2024-02-02 MED ORDER — FENTANYL CITRATE (PF) 250 MCG/5ML IJ SOLN
INTRAMUSCULAR | Status: DC | PRN
  Administered 2024-02-02 (×6): 50 ug via INTRAVENOUS

## 2024-02-02 MED ORDER — BUPIVACAINE HCL (PF) 0.25 % IJ SOLN
INTRAMUSCULAR | Status: DC | PRN
  Administered 2024-02-02 (×2): 17 mL

## 2024-02-02 MED ORDER — GABAPENTIN 300 MG PO CAPS
300.0000 mg | ORAL_CAPSULE | ORAL | Status: AC
  Administered 2024-02-02 (×2): 300 mg via ORAL

## 2024-02-02 MED ORDER — ACETAMINOPHEN 500 MG PO TABS
1000.0000 mg | ORAL_TABLET | ORAL | Status: AC
  Administered 2024-02-02 (×2): 1000 mg via ORAL

## 2024-02-02 MED ORDER — PHENYLEPHRINE HCL-NACL 20-0.9 MG/250ML-% IV SOLN
INTRAVENOUS | Status: DC | PRN
  Administered 2024-02-02 (×2): 20 ug/min via INTRAVENOUS

## 2024-02-02 MED ORDER — LACTATED RINGERS IV SOLN: INTRAVENOUS | Status: DC

## 2024-02-02 MED ORDER — HYDROMORPHONE HCL 1 MG/ML IJ SOLN
0.2500 mg | INTRAMUSCULAR | Status: DC | PRN
  Administered 2024-02-02 (×2): 0.25 mg via INTRAVENOUS

## 2024-02-02 MED ORDER — DROPERIDOL 2.5 MG/ML IJ SOLN: 0.6250 mg | Freq: Once | INTRAMUSCULAR | Status: DC | PRN

## 2024-02-02 MED ORDER — PROPOFOL 10 MG/ML IV BOLUS
INTRAVENOUS | Status: AC
  Filled 2024-02-02: qty 20

## 2024-02-02 MED ORDER — METRONIDAZOLE 500 MG/100ML IV SOLN
INTRAVENOUS | Status: AC
  Filled 2024-02-02: qty 100

## 2024-02-02 MED ORDER — LIDOCAINE 2% (20 MG/ML) 5 ML SYRINGE
INTRAMUSCULAR | Status: AC
  Filled 2024-02-02: qty 5

## 2024-02-02 MED ORDER — SODIUM CHLORIDE (PF) 0.9 % IJ SOLN
INTRAMUSCULAR | Status: AC
  Filled 2024-02-02: qty 10

## 2024-02-02 MED ORDER — GLYCOPYRROLATE PF 0.2 MG/ML IJ SOSY
PREFILLED_SYRINGE | INTRAMUSCULAR | Status: DC | PRN
  Administered 2024-02-02 (×2): .2 mg via INTRAVENOUS

## 2024-02-02 MED ORDER — SODIUM CHLORIDE 0.9 % IR SOLN
Status: DC | PRN
  Administered 2024-02-02 (×2): 1000 mL

## 2024-02-02 MED ORDER — CEFAZOLIN SODIUM-DEXTROSE 2-4 GM/100ML-% IV SOLN
2.0000 g | INTRAVENOUS | Status: AC
  Administered 2024-02-02 (×2): 2 g via INTRAVENOUS

## 2024-02-02 MED ORDER — GABAPENTIN 300 MG PO CAPS
ORAL_CAPSULE | ORAL | Status: AC
  Filled 2024-02-02: qty 1

## 2024-02-02 MED ORDER — CHLORHEXIDINE GLUCONATE 0.12 % MT SOLN
15.0000 mL | Freq: Once | OROMUCOSAL | Status: AC
  Administered 2024-02-02 (×2): 15 mL via OROMUCOSAL

## 2024-02-02 MED ORDER — ACETAMINOPHEN 500 MG PO TABS
ORAL_TABLET | ORAL | Status: AC
  Filled 2024-02-02: qty 2

## 2024-02-02 MED ORDER — SUGAMMADEX SODIUM 200 MG/2ML IV SOLN
INTRAVENOUS | Status: DC | PRN
Start: 2024-02-02 — End: 2024-02-02
  Administered 2024-02-02 (×2): 200 mg via INTRAVENOUS

## 2024-02-02 MED ORDER — PROPOFOL 10 MG/ML IV BOLUS
INTRAVENOUS | Status: DC | PRN
  Administered 2024-02-02 (×2): 100 mg via INTRAVENOUS

## 2024-02-02 MED ORDER — POVIDONE-IODINE 10 % EX SWAB
2.0000 | Freq: Once | CUTANEOUS | Status: AC
  Administered 2024-02-02 (×2): 2 via TOPICAL

## 2024-02-02 MED ORDER — DEXAMETHASONE SODIUM PHOSPHATE 10 MG/ML IJ SOLN
INTRAMUSCULAR | Status: DC | PRN
  Administered 2024-02-02 (×2): 6 mg via INTRAVENOUS

## 2024-02-02 MED ORDER — CEFAZOLIN SODIUM-DEXTROSE 2-4 GM/100ML-% IV SOLN
INTRAVENOUS | Status: AC
  Filled 2024-02-02: qty 100

## 2024-02-02 MED ORDER — ENOXAPARIN SODIUM 40 MG/0.4ML IJ SOSY
PREFILLED_SYRINGE | INTRAMUSCULAR | Status: AC
  Filled 2024-02-02: qty 0.4

## 2024-02-02 MED ORDER — ENOXAPARIN SODIUM 40 MG/0.4ML IJ SOSY
40.0000 mg | PREFILLED_SYRINGE | INTRAMUSCULAR | Status: AC
  Administered 2024-02-02 (×2): 40 mg via SUBCUTANEOUS

## 2024-02-02 MED ORDER — LIDOCAINE-EPINEPHRINE 1 %-1:100000 IJ SOLN
INTRAMUSCULAR | Status: DC | PRN
  Administered 2024-02-02 (×2): 7 mL

## 2024-02-02 MED ORDER — FENTANYL CITRATE (PF) 250 MCG/5ML IJ SOLN
INTRAMUSCULAR | Status: AC
  Filled 2024-02-02: qty 5

## 2024-02-02 MED ORDER — 0.9 % SODIUM CHLORIDE (POUR BTL) OPTIME
TOPICAL | Status: DC | PRN
  Administered 2024-02-02 (×2): 1000 mL

## 2024-02-02 MED ORDER — GLYCOPYRROLATE PF 0.2 MG/ML IJ SOSY
PREFILLED_SYRINGE | INTRAMUSCULAR | Status: AC
  Filled 2024-02-02: qty 1

## 2024-02-02 SURGICAL SUPPLY — 85 items
BAG URINE DRAIN 2000ML AR STRL (UROLOGICAL SUPPLIES) ×5 IMPLANT
BARRIER ADHS 3X4 INTERCEED (GAUZE/BANDAGES/DRESSINGS) IMPLANT
BLADE CLIPPER SENSICLIP SURGIC (BLADE) ×5 IMPLANT
BLADE SURG 15 STRL LF DISP TIS (BLADE) ×5 IMPLANT
CATH FOLEY 3WAY 5CC 16FR (CATHETERS) ×4 IMPLANT
CHLORAPREP W/TINT 26 (MISCELLANEOUS) ×6 IMPLANT
COVER BACK TABLE 60X90IN (DRAPES) ×5 IMPLANT
COVER MAYO STAND STRL (DRAPES) ×5 IMPLANT
COVER TIP SHEARS 8 DVNC (MISCELLANEOUS) ×5 IMPLANT
DEFOGGER SCOPE WARM SEASHARP (MISCELLANEOUS) ×5 IMPLANT
DERMABOND ADVANCED .7 DNX12 (GAUZE/BANDAGES/DRESSINGS) ×7 IMPLANT
DRAPE ARM DVNC X/XI (DISPOSABLE) ×20 IMPLANT
DRAPE COLUMN DVNC XI (DISPOSABLE) ×5 IMPLANT
DRAPE SHEET LG 3/4 BI-LAMINATE (DRAPES) ×5 IMPLANT
DRAPE SURG IRRIG POUCH 19X23 (DRAPES) ×5 IMPLANT
DRAPE UTILITY XL STRL (DRAPES) ×5 IMPLANT
DRIVER NDL LRG 8 DVNC XI (INSTRUMENTS) IMPLANT
DRIVER NDL MEGA SUTCUT DVNCXI (INSTRUMENTS) IMPLANT
DRIVER NDLE LRG 8 DVNC XI (INSTRUMENTS) ×5 IMPLANT
DRIVER NDLE MEGA SUTCUT DVNCXI (INSTRUMENTS) ×10 IMPLANT
ELECTRODE REM PT RTRN 9FT ADLT (ELECTROSURGICAL) ×5 IMPLANT
FORCEPS BPLR 8 MD DVNC XI (FORCEP) ×1 IMPLANT
GAUZE 4X4 16PLY ~~LOC~~+RFID DBL (SPONGE) IMPLANT
GLOVE BIOGEL PI IND STRL 6.5 (GLOVE) ×15 IMPLANT
GLOVE BIOGEL PI IND STRL 7.0 (GLOVE) ×10 IMPLANT
GLOVE BIOGEL PI IND STRL 7.5 (GLOVE) ×1 IMPLANT
GLOVE ECLIPSE 6.0 STRL STRAW (GLOVE) ×15 IMPLANT
GLOVE SURG SS PI 7.0 STRL IVOR (GLOVE) ×2 IMPLANT
GLOVE SURG UNDER POLY LF SZ6.5 (GLOVE) ×20 IMPLANT
GOWN STRL REUS W/ TWL LRG LVL3 (GOWN DISPOSABLE) ×5 IMPLANT
GOWN STRL REUS W/TWL LRG LVL3 (GOWN DISPOSABLE) ×5 IMPLANT
GRASPER TIP-UP FEN DVNC XI (INSTRUMENTS) ×1 IMPLANT
HIBICLENS CHG 4% 4OZ (MISCELLANEOUS) ×4 IMPLANT
HIBICLENS CHG 4% 4OZ BTL (MISCELLANEOUS) ×10 IMPLANT
HOLDER FOLEY CATH W/STRAP (MISCELLANEOUS) ×5 IMPLANT
IRRIGATION STRYKERFLOW (MISCELLANEOUS) ×5 IMPLANT
IRRIGATION SUCT STRKRFLW 2 WTP (MISCELLANEOUS) ×5 IMPLANT
KIT PINK PAD W/HEAD ARM REST (MISCELLANEOUS) ×5 IMPLANT
KIT TURNOVER KIT B (KITS) ×5 IMPLANT
LEGGING LITHOTOMY PAIR STRL (DRAPES) ×5 IMPLANT
MANIFOLD NEPTUNE II (INSTRUMENTS) ×5 IMPLANT
MANIPULATOR ADVINCU DEL 2.5 PL (MISCELLANEOUS) IMPLANT
MANIPULATOR ADVINCU DEL 3.0 PL (MISCELLANEOUS) ×1 IMPLANT
MANIPULATOR ADVINCU DEL 3.5 PL (MISCELLANEOUS) IMPLANT
MANIPULATOR ADVINCU DEL 4.0 PL (MISCELLANEOUS) IMPLANT
MESH VERTESSA LITE -Y 2X4X3 (Mesh General) ×5 IMPLANT
NDL HYPO 22X1.5 SAFETY MO (MISCELLANEOUS) ×4 IMPLANT
NDL INSUFFLATION 14GA 120MM (NEEDLE) ×4 IMPLANT
NEEDLE HYPO 22X1.5 SAFETY MO (MISCELLANEOUS) ×5 IMPLANT
NEEDLE INSUFFLATION 14GA 120MM (NEEDLE) ×5 IMPLANT
NS IRRIG 1000ML POUR BTL (IV SOLUTION) ×5 IMPLANT
OBTURATOR OPTICALSTD 8 DVNC (TROCAR) ×5 IMPLANT
PACK CYSTO (CUSTOM PROCEDURE TRAY) ×5 IMPLANT
PACK ROBOT WH (CUSTOM PROCEDURE TRAY) ×5 IMPLANT
PACK ROBOTIC GOWN (GOWN DISPOSABLE) ×5 IMPLANT
PACK VAGINAL WOMENS (CUSTOM PROCEDURE TRAY) ×5 IMPLANT
PAD OB MATERNITY 11 LF (PERSONAL CARE ITEMS) ×5 IMPLANT
PATTIES SURGICAL .5 X3 (DISPOSABLE) IMPLANT
RETRACTOR LONE STAR DISPOSABLE (INSTRUMENTS) ×5 IMPLANT
RETRACTOR STAY HOOK 5MM (MISCELLANEOUS) ×5 IMPLANT
SCISSORS MNPLR CVD DVNC XI (INSTRUMENTS) IMPLANT
SEAL UNIV 5-12 XI (MISCELLANEOUS) ×25 IMPLANT
SEALER VESSEL EXT DVNC XI (MISCELLANEOUS) ×1 IMPLANT
SET CYSTO W/LG BORE CLAMP LF (SET/KITS/TRAYS/PACK) ×5 IMPLANT
SET IRRIG Y TYPE TUR BLADDER L (SET/KITS/TRAYS/PACK) ×5 IMPLANT
SET TUBE SMOKE EVAC HIGH FLOW (TUBING) ×5 IMPLANT
SLEEVE SCD COMPRESS KNEE MED (STOCKING) ×5 IMPLANT
SPIKE FLUID TRANSFER (MISCELLANEOUS) ×20 IMPLANT
SUCTION TUBE FRAZIER 10FR DISP (SUCTIONS) ×5 IMPLANT
SURGIFLO W/THROMBIN 8M KIT (HEMOSTASIS) IMPLANT
SUT GORETEX NAB #0 THX26 36IN (SUTURE) ×10 IMPLANT
SUT MNCRL AB 4-0 PS2 18 (SUTURE) ×10 IMPLANT
SUT MON AB 2-0 SH 27 (SUTURE) ×5 IMPLANT
SUT STRATA PDS 2-0 23 CT-1 (SUTURE) IMPLANT
SUT VIC AB 0 CT1 27XBRD ANBCTR (SUTURE) ×6 IMPLANT
SUT VIC AB 2-0 SH 27XBRD (SUTURE) ×5 IMPLANT
SUT VICRYL 2-0 SH 8X27 (SUTURE) ×5 IMPLANT
SUT VLOC 180 0 9IN GS21 (SUTURE) ×5 IMPLANT
SUTURE V-LC BRB 180 2/0GR9GS23 (SUTURE) ×11 IMPLANT
SYR BULB EAR ULCER 3OZ GRN STR (SYRINGE) ×5 IMPLANT
TOWEL GREEN STERILE (TOWEL DISPOSABLE) ×5 IMPLANT
TOWEL GREEN STERILE FF (TOWEL DISPOSABLE) ×4 IMPLANT
TRAY FOLEY W/BAG SLVR 14FR LF (SET/KITS/TRAYS/PACK) ×5 IMPLANT
UNDERPAD 30X36 HEAVY ABSORB (UNDERPADS AND DIAPERS) ×5 IMPLANT
WATER STERILE IRR 1000ML POUR (IV SOLUTION) ×4 IMPLANT

## 2024-02-02 NOTE — OR Nursing (Signed)
 Voiding trial started.  300cc sterile water instilled into bladder via cathetor.  Cathetor balloon deflated and removed.  Patient assisted to restroom. Patient was unable to void.  Dr. Marilynne notified.  Orders received to insert foley cathetor and send patient home with cathetor,  14:10 Foley cathetor inserted55f using sterile technique.  Tish Ogg RN assisted.  Patient tolerated well immediate return clear yellow orange urine. Foley care instructions reviewed with patient and patients daughter [place]. Andree Kerns rn

## 2024-02-02 NOTE — Transfer of Care (Signed)
 Immediate Anesthesia Transfer of Care Note  Patient: Cynthia Barton  Procedure(s) Performed: HYSTERECTOMY, TOTAL, ROBOT-ASSISTED, LAPAROSCOPIC, WITH BILATERAL SALPINGO-OOPHORECTOMY (Abdomen) SACROCOLPOPEXY, ROBOT-ASSISTED, LAPAROSCOPIC (Pelvis) COLPORRHAPHY, POSTERIOR, FOR RECTOCELE REPAIR (Vagina ) PERINEOPLASTY (Perineum) CYSTOSCOPY (Bladder)  Patient Location: PACU  Anesthesia Type:General  Level of Consciousness: patient cooperative  Airway & Oxygen Therapy: Patient Spontanous Breathing  Post-op Assessment: Report given to RN, Post -op Vital signs reviewed and stable, Patient moving all extremities X 4, and Patient able to stick tongue midline  Post vital signs: Reviewed and stable  Last Vitals:  Vitals Value Taken Time  BP 144/75 02/02/24 11:34  Temp 97.1   Pulse 65 02/02/24 11:35  Resp 19 02/02/24 11:35  SpO2 97 % 02/02/24 11:35  Vitals shown include unfiled device data.  Last Pain:  Vitals:   02/02/24 0550  TempSrc: Oral  PainSc: 0-No pain      Patients Stated Pain Goal: 4 (02/02/24 0550)  Complications: No notable events documented.

## 2024-02-02 NOTE — Op Note (Signed)
 Operative Note  Preoperative Diagnosis: anterior vaginal prolapse, posterior vaginal prolapse, and uterovaginal prolapse, incomplete  Postoperative Diagnosis: same  Procedures performed:  Robotic assisted total laparoscopic hysterectomy with bilateral salpingo-oophorectomy, sacrocolpopexy (vertessa lite Y), cystoscopy, perineorrhaphy  Implants:  Implant Name Type Inv. Item Serial No. Manufacturer Lot No. LRB No. Used Action  MESH LAJUANDA MAJORIE SE [id number] Mesh [employer] MESH LAJUANDA MAJORIE SE NITA  [facility] MEDICAL [id number] N/A 1 Implanted    Attending Surgeon: Rosaline Caper, MD  Assistant: Jorene Moats, PA  Anesthesia: General endotracheal  Findings: 1. On vaginal exam, stage III prolapse present  2. On cystoscopy, normal bladder and urethral mucosa without injury or lesion. Brisk bilateral ureteral efflux present.    3. On laparoscopy, no significant adhesions present. Normal appearing uterus and adnexa.   Specimens:  ID Type Source Tests Collected by Time Destination  1 : Uterus, cervix, bilateral fallopian tubes, bilateral ovaries Tissue PATH Gyn benign resection SURGICAL PATHOLOGY Caper Rosaline SAILOR, MD 02/02/2024 [phone]     Estimated blood loss: 100 mL  IV fluids: 1000 mL  Urine output: 350 mL  Complications: mega suture cut instrument broke intraoperatively requiring a post operative x-ray prior to extubation  Procedure in Detail:  After informed consent was obtained, the patient was taken to the operating room, where general anesthesia was induced and found to be adequate. She was placed in dorsolithotomy position in [place] stirrups. Her hips were noted not to be hyperflexed or hyperextended. Her arms were padded with gel pads and tucked to her sides. Her hands were surrounded by foam. A padded strap was placed across her chest with foam between the pad and her skin. She was noted to be appropriately positioned with all pressure points well padded and off  tension. A tilt test showed no slippage. She was prepped and draped in the usual sterile fashion. A uterine manipulator was placed in the uterus after sounding to 9 cm, an appropriately sized Koh ring was placed around the cervix, and a pneumo-occluder balloon was positioned in the vagina for later use.  A sterile Foley catheter was inserted.   0.25% plain Marcaine  was injected in the supraumbilical area and an 8 mm supraumbilical skin incision was made with the scalpel.  A Veress needle was inserted into the incision, CO2 insufflation was started, a low opening pressure was noted, and pneumoperitoneum was obtained. The Veress needle was removed and a 8mm robotic trocar was placed. The robotic camera was inserted and intraperitoneal placement was confirmed. Survey of the abdomen and pelvis revealed no significant adhesions. The sacrum appeared to be free of any adhesive disease. After determining placement for the other ports, local anesthetic was injected at each site and two 8 mm incisions were made for robotic ports at 10 cm lateral to and at the level of the umbilical port. Two additional 8 mm incisions were made 10 cm lateral to these and 30 degrees down followed by 8 mm robotic ports - the right side for an assistant port. All trocars were placed sequentially under direct visualization of the camera. The patient was placed in Trendelenburg. The robot was docked on the patient's right side. Monopolar endoshears alternating with the vessel sealer, were placed in the right arm, a [place]  bipolar grasper was placed in the 2nd arm of the patient's left side, and a Tip up grasper was placed in the 3rd arm on the patient's left side.   Attention was then turned to the sacral promontory. The peritoneum overlying  the sacral promontory was tented up, dissected sharply with monopolar scissors and electrosurgery using layer by layer technique. The peritoneal incision was extended down to the posterior cul-de-sac.  This was performed with care to avoid the ureter on the right side and the sigmoid colon and its mesentary on the left side. Two transverse sutures of CV2 Gortex were placed in the anterior longitudinal ligament.  Attention was then turned to the robotic hysterectomy and BSO. The ureter was identified and was found to be well away from the planned site of incision. Using the monopolar scissors, a window was made on the posterior leaf of the broad ligament. The infundibulopelvic ligament was cauterized and transected. The right round ligament was grasped, cauterized, and transected with electrocautery. The anterior and posterior leaves of the broad ligament were taken down with cautery and sharp dissection. The uterine artery was skeletonized and the bladder flap was created on the right side with a combination of electrosurgery and sharp dissection. The KOH ring was identified. The right uterine artery was clamped, cauterized, and transected. In a similar fashion, the left side was taken down. Further sharp dissection with combination of cautery was performed to further develop the bladder flap. At this point, the KOH ring was completely hugging the cervix. The pneumo-occluder balloon in the vagina was inflated to maintain pneumoperitoneum. A colpotomy was performed with electrosurgical cutting current and the uterus and cervix were completely amputated from the vagina. The specimen was delivered through the vagina. The posterior portion of the vaginal cuff was then grasped and pulled up to maintain pneumoperitoneum. The pneumo-occluder balloon was then replaced in the vagina. The right hand instrument was changed to a suture-cut needle driver. The vaginal cuff was then closed using a 0 V-lock suture in two layers.    The right hand instrument was replaced with monopolar endoshears. With a lucite probe in the vagina, the anterior vaginal dissection was then performed with sharp dissection and electrosurgery. The  posterior vaginal dissection was then performed with sharp dissection and electrosurgery in order to dissect the rectum away from the posterior vagina.  A Y mesh was then inserted into the abdomen after trimming to appropriate size. With the probe in the vagina, the anterior leaf of the Y mesh was affixed to the anterior portion of the vagina using a 2-0 v-loc suture in a spiral pattern to distribute the suture evenly across the surface of the anterior mesh leaf. In a similar fashion, the posterior leaf of the Y mesh was attached to the posterior surface of the vagina with 2-0 v-loc suture.  The distal end of the mesh was then brought to overlie the sacrum. The correct amount of tension was determined in order to elevate the vagina, but not put the mesh under tension. The distal end of the mesh was then affixed to the anterior longitudinal sacral ligament using two interrupted transverse stitches of CV2 Gortex. The excess distal mesh was then cut and removed. The peritoneum was reapproximated over the mesh using 2-0 monocryl. The bladder flap was incorporated to completely retroperitonealize the mesh. All pedicles were carefully inspected and noted to be hemostatic as the CO2 gas was deflated. All instruments were removed from the patient's abdomen.   The Foley catheter was removed.  A 70-degree cystoscope was introduced, and 360-degree inspection revealed no injury, lesion or foreign body in the bladder. Brisk bilateral ureteral efflux was noted with the assistance of pyridium .  The bladder was drained and the cystoscope was removed.  The Foley catheter was replaced.  The robot was undocked. The CO2 gas was removed and the ports were removed.  The skin incisions were closed with subcutaneous stitches of 4-0 Monocryl and covered with skin glue.  Attention was then turned to the perineum. Two allis clamps were placed at the introitus. The perineum was injected with 1% lidocaine  with epinephrine . A diamond  shaped incision was made over the perineum and excess skin was removed. Dissection was performed with Metzenbaum scissors to separate the mucosa from the underlying tissue. The perineal body was then reapproximated with two interrupted 0-vicryl sutures. The perineal skin was then closed with a 2-0 vicryl in a subcutaneous and subcuticular fashion. Irrigation was performed and good hemostasis was noted. Sponge, lap, and needle counts were correct x 2. The patient tolerated the procedure well. She was awakened from anesthesia and transferred to the recovery room in stable condition.   [name], MD

## 2024-02-02 NOTE — Anesthesia Procedure Notes (Signed)
 Procedure Name: Intubation Date/Time: 02/02/2024 7:55 AM  Performed by: Viviana Almarie DASEN, CRNAPre-anesthesia Checklist: Patient identified, Emergency Drugs available, Suction available and Patient being monitored Patient Re-evaluated:Patient Re-evaluated prior to induction Oxygen Delivery Method: Circle System Utilized Preoxygenation: Pre-oxygenation with 100% oxygen Induction Type: IV induction Ventilation: Mask ventilation without difficulty Laryngoscope Size: Mac and 3 Grade View: Grade I Tube type: Oral Tube size: 6.5 mm Number of attempts: 1 Airway Equipment and Method: Stylet and Bite block Placement Confirmation: ETT inserted through vocal cords under direct vision, positive ETCO2 and breath sounds checked- equal and bilateral Secured at: 20 cm Tube secured with: Tape Dental Injury: Teeth and Oropharynx as per pre-operative assessment

## 2024-02-02 NOTE — Discharge Instructions (Addendum)

## 2024-02-02 NOTE — Interval H&P Note (Signed)
 History and Physical Interval Note:  02/02/2024 7:08 AM  Cynthia Barton  has presented today for surgery, with the diagnosis of uterovaginal prolapse incomplete.  The various methods of treatment have been discussed with the patient and family. After consideration of risks, benefits and other options for treatment, the patient has consented to  Procedure(s) with comments: HYSTERECTOMY, TOTAL, ROBOT-ASSISTED, LAPAROSCOPIC, WITH BILATERAL SALPINGO-OOPHORECTOMY (N/A) - Total time 3.5 hours SACROCOLPOPEXY, ROBOT-ASSISTED, LAPAROSCOPIC (N/A) COLPORRHAPHY, POSTERIOR, FOR RECTOCELE REPAIR (N/A) PERINEOPLASTY (N/A) CYSTOSCOPY (N/A) as a surgical intervention.  The patient's history has been reviewed, patient examined, no change in status, stable for surgery.  I have reviewed the patient's chart and labs.  Questions were answered to the patient's satisfaction.     [name]

## 2024-02-03 ENCOUNTER — Telehealth: Payer: Self-pay | Admitting: Obstetrics and Gynecology

## 2024-02-03 ENCOUNTER — Encounter (HOSPITAL_COMMUNITY): Payer: Self-pay | Admitting: Obstetrics and Gynecology

## 2024-02-03 NOTE — Telephone Encounter (Signed)
 Cynthia Barton underwent Robotic assisted total laparoscopic hysterectomy with bilateral salpingo-oophorectomy, sacrocolpopexy (vertessa lite Y), cystoscopy, perineorrhaphy on 02/02/24.   She failed her voiding trial.  was backfilled into the bladder She was unable to void  She was discharged with a catheter. Please call her for a routine post op check and to schedule a voiding trial by Thurs or Fri 8/14-18/15. Thanks!  [name], MD

## 2024-02-03 NOTE — Anesthesia Postprocedure Evaluation (Signed)
 Anesthesia Post Note  Patient: Electronics engineer) Performed: HYSTERECTOMY, TOTAL, ROBOT-ASSISTED, LAPAROSCOPIC, WITH BILATERAL SALPINGO-OOPHORECTOMY (Abdomen) SACROCOLPOPEXY, ROBOT-ASSISTED, LAPAROSCOPIC (Pelvis) PERINEOPLASTY (Perineum) CYSTOSCOPY (Bladder)     Patient location during evaluation: PACU Anesthesia Type: General Level of consciousness: sedated and patient cooperative Pain management: pain level controlled Vital Signs Assessment: post-procedure vital signs reviewed and stable Respiratory status: spontaneous breathing Cardiovascular status: stable Anesthetic complications: no   No notable events documented.  Last Vitals:  Vitals:   02/02/24 1330 02/02/24 1450  BP: 132/80 132/80  Pulse: (!) 59 62  Resp: 15 19  Temp:    SpO2: 97% 98%    Last Pain:  Vitals:   02/02/24 1305  TempSrc:   PainSc: 4                  Norleen Pope

## 2024-02-04 LAB — SURGICAL PATHOLOGY

## 2024-02-05 ENCOUNTER — Ambulatory Visit

## 2024-02-05 NOTE — Progress Notes (Signed)
 Cynthia Barton underwent Hysterectomy, Total, Robot-assisted, Laparoscopic, With Bilateral Salpingo-oophorectomy, Sacrocolpopexy, Robot-assisted, Laparoscopic, Perineoplasty, and Cystoscopy on 02/02/2024  She presents for a voiding trial.   Patient was identified with 2 identifiers.  The patient states she does not have any concerns with the foley placed.  200 mL of NS was instilled into the bladder via a catheter.  The catheter was removed and patient was instructed to void into the urinary hat.  She voided 190 mL.  The post void residual measured by bladder scan was 21 mL.  She did pass the voiding trial.  The patient was not sent home with a catheter.    The patient received aftercare instructions and will follow up as scheduled.

## 2024-02-05 NOTE — Patient Instructions (Signed)
  Please drink lots of water and try to expel as much urine as you are inputting. If you are unable to urinate, give the office a call before 2 pm.     Please keep all future appointments and if you have any questions or concerns please feel free to contact our office at [phone].

## 2024-02-09 ENCOUNTER — Telehealth: Payer: Self-pay

## 2024-02-09 NOTE — Telephone Encounter (Signed)
 Changes in bladder habits can be normal for several weeks following surgery. Please confirm if she has any dysuria or bladder pain. Also please ensure she is urinating well during the day and emptying her bladder. If she is unsure, then she may need to come for a POC urine test and PVR.

## 2024-02-09 NOTE — Telephone Encounter (Signed)
 Patient's daughter called stating that Cynthia Barton is having difficulties moving her bowels. I suggested she continue using Miralax  to help with BM's. She also said her mother is now having Nocturia (post surgery 02/02/2024), she's waking up with her depends wet in the mornings. She wants to know if that normal.

## 2024-02-12 NOTE — Telephone Encounter (Signed)
 Returned patient call. She is still having nocturia, she says she doesn't have the sensation to wake up to urinate, causing her to wake up in the morning with a full diaper. Advised to call back if this keeps occurring. Her bowels are moving well. Denies any UTI or feeling of incomplete emptying.

## 2024-02-25 ENCOUNTER — Ambulatory Visit: Payer: Medicare HMO | Admitting: Internal Medicine

## 2024-02-25 ENCOUNTER — Ambulatory Visit: Payer: Self-pay | Admitting: Internal Medicine

## 2024-02-25 VITALS — BP 118/80 | HR 68 | Temp 97.6°F | Ht <= 58 in | Wt 172.2 lb

## 2024-02-25 DIAGNOSIS — E66812 Obesity, class 2: Secondary | ICD-10-CM

## 2024-02-25 DIAGNOSIS — R7309 Other abnormal glucose: Secondary | ICD-10-CM

## 2024-02-25 DIAGNOSIS — I129 Hypertensive chronic kidney disease with stage 1 through stage 4 chronic kidney disease, or unspecified chronic kidney disease: Secondary | ICD-10-CM | POA: Diagnosis not present

## 2024-02-25 DIAGNOSIS — Z0000 Encounter for general adult medical examination without abnormal findings: Secondary | ICD-10-CM | POA: Diagnosis not present

## 2024-02-25 DIAGNOSIS — Z6837 Body mass index (BMI) 37.0-37.9, adult: Secondary | ICD-10-CM

## 2024-02-25 DIAGNOSIS — Z23 Encounter for immunization: Secondary | ICD-10-CM

## 2024-02-25 DIAGNOSIS — E7800 Pure hypercholesterolemia, unspecified: Secondary | ICD-10-CM

## 2024-02-25 DIAGNOSIS — E2839 Other primary ovarian failure: Secondary | ICD-10-CM

## 2024-02-25 DIAGNOSIS — N182 Chronic kidney disease, stage 2 (mild): Secondary | ICD-10-CM

## 2024-02-25 LAB — POCT URINALYSIS DIP (CLINITEK)
Bilirubin, UA: NEGATIVE
Glucose, UA: NEGATIVE mg/dL
Ketones, POC UA: NEGATIVE mg/dL
Nitrite, UA: NEGATIVE
POC PROTEIN,UA: NEGATIVE
Spec Grav, UA: 1.02 (ref 1.010–1.025)
Urobilinogen, UA: 0.2 U/dL
pH, UA: 6 (ref 5.0–8.0)

## 2024-02-25 NOTE — Progress Notes (Signed)
 I,[name], CMA,acting as a Neurosurgeon for [name], MD.,have documented all relevant documentation on the behalf of [name], MD,as directed by  [name], MD while in the presence of [name], MD.  Subjective:    Patient ID: Cynthia Barton , female    DOB: March 02, 1946 , 78 y.o.   MRN: [id number]  Chief Complaint  Patient presents with   Annual Exam    Patient presents today for annual exam. She reports compliance with medications. Denies headache, chest pain & sob.    Hypertension   Hyperlipidemia    HPI Discussed the use of AI scribe software for clinical note transcription with the patient, who gave verbal consent to proceed.  History of Present Illness Cynthia Barton is a 78 year old female who presents for a physical exam and blood pressure check following a total hysterectomy and sacrocopopexy., She is accompanied by her daughter today.    She underwent a total hysterectomy and sacrocopopexy on February 02, 2024. Post-procedure, she feels better and no longer experiences the heaviness she felt prior. However, she notes occasional urinary incontinence during sleep, though she was able to get up and go to the bathroom last night.  She has a history of elevated calcium  levels, noted on January 29, 2024, prior to her surgery. Despite not taking calcium  supplements, her calcium  levels remain high. She consumes lactose-free 2% milk daily and enjoys ice cream frequently, which she has loved since childhood.  She is currently taking amlodipine  2.5mg  in the evening, lisinopril  20 mg in the morning, and rosuvastatin  5 mg. She also takes a multivitamin regularly.  In terms of physical activity, she is walking and occasionally going up and down steps. She reports no pain and feels she is doing well overall.  Her bowel movements are regular, occurring daily, though she sometimes uses Miralax . There is no family history of breast cancer, but her mother had skin  cancer.   Hypertension This is a chronic problem. The current episode started more than 1 year ago. The problem has been gradually improving since onset. The problem is controlled. Pertinent negatives include no blurred vision or palpitations. Risk factors for coronary artery disease include dyslipidemia, post-menopausal state, obesity and sedentary lifestyle. The current treatment provides moderate improvement. Compliance problems include exercise.  Hypertensive end-organ damage includes kidney disease.     Past Medical History:  Diagnosis Date   Bilateral carpal tunnel syndrome 02/23/2020   CKD (chronic kidney disease), stage II    Diverticulosis of colon    Hemorrhoids    History of external beam radiation therapy 10/2014   right breast cancer---   11-10-2014  to  12-09-2014,  radiation oncolgist--- dr patrcia   Hyperlipidemia    Hypertension    Incomplete bladder emptying    Malignant neoplasm of lower-outer quadrant of right breast of female, estrogen receptor positive (HCC) 08/2014   oncologist--- dr odean;  dx 03/ 2016;   09-30-2014  s/p right lumpectomy w/ sln bx;  Stage IA, IDC w/ DCIS, ER/PR+, node negative;  completed IMRT 06/ 2016;  completed anastrozole  03/ 2023  (lov 10-16-2021 released prn )   OAB (overactive bladder)    PMB (postmenopausal bleeding)    Pre-diabetes    SUI (stress urinary incontinence, female)    Uterovaginal prolapse, incomplete 2016   Wears glasses    Wears hearing aid in both ears    Wears partial dentures    upper     Family History  Problem Relation Age of Onset   Melanoma Mother    Heart disease Father    Kidney failure Sister 81   Prostate cancer Brother    Heart disease Maternal Uncle    Colon cancer Neg Hx    Esophageal cancer Neg Hx    Stomach cancer Neg Hx    Rectal cancer Neg Hx    Uterine cancer Neg Hx    Bladder Cancer Neg Hx    Renal cancer Neg Hx      Current Outpatient Medications:    amLODipine  (NORVASC ) 2.5 MG  tablet, Take 1 tablet by mouth once daily (Patient taking differently: Take 2.5 mg by mouth at bedtime. Take 1 tablet by mouth once daily), Disp: 90 tablet, Rfl: 2   Black Elderberry,Berry-Flower, 575 MG CAPS, Take 1 capsule by mouth daily., Disp: , Rfl:    Cholecalciferol (VITAMIN D3) 125 MCG (5000 UT) CAPS, Take 1 capsule by mouth daily., Disp: , Rfl:    ibuprofen  (ADVIL ) 600 MG tablet, Take 1 tablet (600 mg total) by mouth every 6 (six) hours as needed., Disp: 30 tablet, Rfl: 0   lisinopril  (ZESTRIL ) 20 MG tablet, Take 1 tablet by mouth once daily (Patient taking differently: Take 20 mg by mouth daily.), Disp: 90 tablet, Rfl: 2   Multiple Vitamins-Minerals (MULTIVITAMIN WITH MINERALS) tablet, Take 1 tablet by mouth daily., Disp: , Rfl:    rosuvastatin  (CRESTOR ) 5 MG tablet, TAKE 1 TABLET EVERY DAY (Patient taking differently: Take 5 mg by mouth daily.), Disp: 90 tablet, Rfl: 3   acetaminophen  (TYLENOL ) 500 MG tablet, Take 1 tablet (500 mg total) by mouth every 6 (six) hours as needed (pain). (Patient not taking: Reported on 02/25/2024), Disp: 30 tablet, Rfl: 0   oxyCODONE  (OXY IR/ROXICODONE ) 5 MG immediate release tablet, Take 1 tablet (5 mg total) by mouth every 4 (four) hours as needed for severe pain (pain score 7-10). (Patient not taking: Reported on 02/25/2024), Disp: 15 tablet, Rfl: 0   No Known Allergies    The patient states she uses post menopausal status for birth control. No LMP recorded. Patient is postmenopausal.. Negative for Dysmenorrhea. Negative for: breast discharge, breast lump(s), breast pain and breast self exam. Associated symptoms include abnormal vaginal bleeding. Pertinent negatives include abnormal bleeding (hematology), anxiety, decreased libido, depression, difficulty falling sleep, dyspareunia, history of infertility, nocturia, sexual dysfunction, sleep disturbances, urinary incontinence, urinary urgency, vaginal discharge and vaginal itching. Diet regular.The patient states  her exercise level is  intermittent.  . The patient's tobacco use is:  Social History   Tobacco Use  Smoking Status Never  Smokeless Tobacco Never  . She has been exposed to passive smoke. The patient's alcohol use is:  Social History   Substance and Sexual Activity  Alcohol Use Not Currently    Review of Systems  Constitutional: Negative.   HENT: Negative.    Eyes: Negative.  Negative for blurred vision.  Respiratory: Negative.    Cardiovascular: Negative.  Negative for palpitations.  Gastrointestinal: Negative.   Endocrine: Negative.   Genitourinary: Negative.   Musculoskeletal: Negative.   Skin: Negative.   Allergic/Immunologic: Negative.   Neurological: Negative.   Hematological: Negative.   Psychiatric/Behavioral: Negative.       Today's Vitals   02/25/24 1001  BP: 118/80  Pulse: 68  Temp: 97.6 F (36.4 C)  SpO2: 98%  Weight: 172 lb 3.2 oz (78.1 kg)  Height: 4' 9 (1.448 m)   Body mass index is 37.26 kg/m.  Wt Readings from Last  3 Encounters:  02/25/24 172 lb 3.2 oz (78.1 kg)  02/02/24 170 lb (77.1 kg)  01/22/24 174 lb 12.8 oz (79.3 kg)     Objective:  Physical Exam Vitals and nursing note reviewed. Exam conducted with a chaperone present.  Constitutional:      Appearance: Normal appearance. She is obese.  HENT:     Head: Normocephalic and atraumatic.     Right Ear: Tympanic membrane, ear canal and external ear normal.     Left Ear: Tympanic membrane, ear canal and external ear normal.     Nose: Nose normal.     Mouth/Throat:     Mouth: Mucous membranes are moist.     Pharynx: Oropharynx is clear.  Eyes:     Extraocular Movements: Extraocular movements intact.     Conjunctiva/sclera: Conjunctivae normal.     Pupils: Pupils are equal, round, and reactive to light.  Cardiovascular:     Rate and Rhythm: Normal rate and regular rhythm.     Pulses: Normal pulses.     Heart sounds: Normal heart sounds.  Pulmonary:     Effort: Pulmonary effort is  normal.     Breath sounds: Normal breath sounds.  Chest:  Breasts:    Tanner Score is 5.     Left: Normal.     Comments: Healed surgical scar on right Abdominal:     General: Bowel sounds are normal.     Palpations: Abdomen is soft.     Hernia: There is no hernia in the left inguinal area or right inguinal area.     Comments: Obese, soft  Genitourinary:    Exam position: Lithotomy position.     Tanner stage (genital): 5.     Cervix: Normal.     Comments: deferred Musculoskeletal:        General: Normal range of motion.     Cervical back: Normal range of motion and neck supple.  Lymphadenopathy:     Lower Body: No right inguinal adenopathy.  Skin:    General: Skin is warm and dry.  Neurological:     General: No focal deficit present.     Mental Status: She is alert and oriented to person, place, and time.  Psychiatric:        Mood and Affect: Mood normal.        Behavior: Behavior normal.         Assessment And Plan:     Encounter for annual health examination Assessment & Plan: A full exam was performed.  Importance of monthly self breast exams was discussed with the patient.  She is advised to get 30-45 minutes of regular exercise, no less than four to five days per week. Both weight-bearing and aerobic exercises are recommended.  She is advised to follow a healthy diet with at least six fruits/veggies per day, decrease intake of red meat and other saturated fats and to increase fish intake to twice weekly.  Meats/fish should not be fried -- baked, boiled or broiled is preferable. It is also important to cut back on your sugar intake.  Be sure to read labels - try to avoid anything with added sugar, high fructose corn syrup or other sweeteners.  If you must use a sweetener, you can try stevia or monkfruit.  It is also important to avoid artificially sweetened foods/beverages and diet drinks. Lastly, wear SPF 50 sunscreen on exposed skin and when in direct sunlight for an  extended period of time.  Be sure to avoid  fast food restaurants and aim for at least 60 ounces of water daily.       Benign hypertension with CKD (chronic kidney disease), stage II Assessment & Plan: Chronic, well controlled.  EKG performed, NSR w/o acute changes.  She will c/w lisinopril  20mg  and amlodipine  2.5mg  daily for now.  -  Advised to take lisinopril  20mg  in AM and amlodipine  2.5mg  nightly. Reminded to follow low sodium diet. -  She is also encouraged to stay well hydrated. Importance of regular exercise was also discussed with the patient.   Orders: -     POCT URINALYSIS DIP (CLINITEK) -     Microalbumin / creatinine urine ratio -     EKG 12-Lead -     CBC -     Lipid panel -     BMP8+EGFR  Pure hypercholesterolemia Assessment & Plan: Chronic, currently taking rosuvastatin  5mg  daily. Encouraged to follow a heart healthy lifestyle.  - aim for at least 150 minutes of exercise per week.   Orders: -     Lipid panel  Hypercalcemia Assessment & Plan: Persistent hypercalcemia with unclear etiology. Not taking calcium  supplements. Potential need for endocrinology referral if persists. - Order additional blood work to investigate hypercalcemia. - Consider referral to endocrinologist if hypercalcemia persists. - Provide 24-hour urine collection kit to assess calcium  excretion if needed.  Orders: -     Protein electrophoresis, serum -     BMP8+EGFR -     VITAMIN D  25 Hydroxy (Vit-D Deficiency, Fractures) -     Vitamin D  1,25 dihydroxy -     PTH, intact and calcium  -     Calcium , urine, 24 hour; Future  Other abnormal glucose Assessment & Plan: Previous labs reviewed, her A1c has been elevated in the past. I will check an A1c today. Reminded to avoid refined sugars including sugary drinks/foods and processed meats including bacon, sausages and deli meats.    Orders: -     Hemoglobin A1c  Estrogen deficiency -     DG Bone Density; Future  Class 2 severe obesity due  to excess calories with serious comorbidity and body mass index (BMI) of 37.0 to 37.9 in adult [facility]) Assessment & Plan: She is encouraged to strive for BMI less than 30 to decrease cardiac risk. Advised to aim for at least 150 minutes of exercise per week.    Immunization due -     Tdap vaccine greater than or equal to 7yo IM   Return for 1 year HM, 6 MONTH BPC & CHOL. Patient was given opportunity to ask questions. Patient verbalized understanding of the plan and was able to repeat key elements of the plan. All questions were answered to their satisfaction.   I, [name], MD, have reviewed all documentation for this visit. The documentation on 02/25/24 for the exam, diagnosis, procedures, and orders are all accurate and complete.

## 2024-02-25 NOTE — Patient Instructions (Signed)

## 2024-02-26 DIAGNOSIS — Z1231 Encounter for screening mammogram for malignant neoplasm of breast: Secondary | ICD-10-CM | POA: Diagnosis not present

## 2024-02-26 LAB — PTH, INTACT AND CALCIUM
Calcium: 10.8 mg/dL — ABNORMAL HIGH (ref 8.7–10.3)
PTH: 22 pg/mL (ref 15–65)

## 2024-02-26 LAB — HM MAMMOGRAPHY

## 2024-03-02 ENCOUNTER — Encounter: Payer: Self-pay | Admitting: Internal Medicine

## 2024-03-02 NOTE — Assessment & Plan Note (Signed)
 Chronic, currently taking rosuvastatin  5mg  daily. Encouraged to follow a heart healthy lifestyle.  - aim for at least 150 minutes of exercise per week.

## 2024-03-02 NOTE — Assessment & Plan Note (Signed)

## 2024-03-02 NOTE — Assessment & Plan Note (Signed)
 Chronic, well controlled.  EKG performed, NSR w/o acute changes.  She will c/w lisinopril  20mg  and amlodipine  2.5mg  daily for now.  -  Advised to take lisinopril  20mg  in AM and amlodipine  2.5mg  nightly. Reminded to follow low sodium diet. -  She is also encouraged to stay well hydrated. Importance of regular exercise was also discussed with the patient.

## 2024-03-02 NOTE — Assessment & Plan Note (Signed)
 She is encouraged to strive for BMI less than 30 to decrease cardiac risk. Advised to aim for at least 150 minutes of exercise per week.

## 2024-03-02 NOTE — Assessment & Plan Note (Signed)
 Persistent hypercalcemia with unclear etiology. Not taking calcium  supplements. Potential need for endocrinology referral if persists. - Order additional blood work to investigate hypercalcemia. - Consider referral to endocrinologist if hypercalcemia persists. - Provide 24-hour urine collection kit to assess calcium  excretion if needed.

## 2024-03-02 NOTE — Assessment & Plan Note (Signed)
 Previous labs reviewed, her A1c has been elevated in the past. I will check an A1c today. Reminded to avoid refined sugars including sugary drinks/foods and processed meats including bacon, sausages and deli meats.

## 2024-03-12 LAB — CBC
Hematocrit: 43.1 % (ref 34.0–46.6)
Hemoglobin: 13.7 g/dL (ref 11.1–15.9)
MCH: 29.1 pg (ref 26.6–33.0)
MCHC: 31.8 g/dL (ref 31.5–35.7)
MCV: 92 fL (ref 79–97)
Platelets: 222 x10E3/uL (ref 150–450)
RBC: 4.71 x10E6/uL (ref 3.77–5.28)
RDW: 13.8 % (ref 11.7–15.4)
WBC: 5.6 x10E3/uL (ref 3.4–10.8)

## 2024-03-12 LAB — BMP8+EGFR
BUN/Creatinine Ratio: 16 (ref 12–28)
BUN: 12 mg/dL (ref 8–27)
CO2: 23 mmol/L (ref 20–29)
Calcium: 10.8 mg/dL — ABNORMAL HIGH (ref 8.7–10.3)
Chloride: 105 mmol/L (ref 96–106)
Creatinine, Ser: 0.74 mg/dL (ref 0.57–1.00)
Glucose: 98 mg/dL (ref 70–99)
Potassium: 4.7 mmol/L (ref 3.5–5.2)
Sodium: 141 mmol/L (ref 134–144)
eGFR: 83 mL/min/1.73 (ref >59)

## 2024-03-12 LAB — PROTEIN ELECTROPHORESIS, SERUM
A/G Ratio: 0.9 (ref 0.7–1.7)
Albumin ELP: 3.3 g/dL (ref 2.9–4.4)
Alpha 1: 0.2 g/dL (ref 0.0–0.4)
Alpha 2: 0.7 g/dL (ref 0.4–1.0)
Beta: 1.1 g/dL (ref 0.7–1.3)
Gamma Globulin: 1.5 g/dL (ref 0.4–1.8)
Globulin, Total: 3.5 g/dL (ref 2.2–3.9)
Total Protein: 6.8 g/dL (ref 6.0–8.5)

## 2024-03-12 LAB — VITAMIN D 1,25 DIHYDROXY
Vitamin D 1, 25 (OH)2 Total: 85 pg/mL — ABNORMAL HIGH
Vitamin D2 1, 25 (OH)2: <10 pg/mL
Vitamin D3 1, 25 (OH)2: 85 pg/mL

## 2024-03-12 LAB — LIPID PANEL
Chol/HDL Ratio: 2.7 ratio (ref 0.0–4.4)
Cholesterol, Total: 151 mg/dL (ref 100–199)
HDL: 56 mg/dL (ref >39)
LDL Chol Calc (NIH): 79 mg/dL (ref 0–99)
Triglycerides: 83 mg/dL (ref 0–149)
VLDL Cholesterol Cal: 16 mg/dL (ref 5–40)

## 2024-03-12 LAB — MICROALBUMIN / CREATININE URINE RATIO
Creatinine, Urine: 107.9 mg/dL
Microalb/Creat Ratio: 20 mg/g{creat} (ref 0–29)
Microalbumin, Urine: 21.6 ug/mL

## 2024-03-12 LAB — HEMOGLOBIN A1C
Est. average glucose Bld gHb Est-mCnc: 120 mg/dL
Hgb A1c MFr Bld: 5.8 % — ABNORMAL HIGH (ref 4.8–5.6)

## 2024-03-12 LAB — VITAMIN D 25 HYDROXY (VIT D DEFICIENCY, FRACTURES): Vit D, 25-Hydroxy: 78.8 ng/mL (ref 30.0–100.0)

## 2024-03-24 ENCOUNTER — Ambulatory Visit: Admitting: Obstetrics and Gynecology

## 2024-03-24 ENCOUNTER — Encounter: Payer: Self-pay | Admitting: Obstetrics and Gynecology

## 2024-03-24 VITALS — BP 110/83 | HR 70

## 2024-03-24 DIAGNOSIS — Z48816 Encounter for surgical aftercare following surgery on the genitourinary system: Secondary | ICD-10-CM

## 2024-03-24 DIAGNOSIS — Z9889 Other specified postprocedural states: Secondary | ICD-10-CM

## 2024-03-24 DIAGNOSIS — N3941 Urge incontinence: Secondary | ICD-10-CM

## 2024-03-24 NOTE — Progress Notes (Signed)
 [facility]  Date of Visit: 03/24/2024  History of Present Illness: [name] is a 78 y.o. female scheduled today for a post-operative visit.   Surgery: s/p Robotic assisted total laparoscopic hysterectomy with bilateral salpingo-oophorectomy, sacrocolpopexy (vertessa lite Y), cystoscopy, perineorrhaphy on 02/02/24  She did not pass her postoperative void trial. She returned 02/05/24 and passed her voiding trial.   Postoperative course has otherwise been uncomplicated.   Today she reports she is feeling well. She did lift a case of gatorade and had a little bleeding but that was a few weeks ago. None since then.   UTI in the last 6 weeks? No  Pain? No  She has returned to her normal activity (except for postop restrictions) Vaginal bulge? No  Stress incontinence: No  Urgency/frequency: No  Urge incontinence: Was occurring more often initially after surgery, but is not happening every day now.  Voiding dysfunction: No  Bowel issues: No   Subjective Success: Do you usually have a bulge or something falling out that you can see or feel in the vaginal area? No  Retreatment Success: Any retreatment with surgery or pessary for any compartment? No   Pathology results: UTERUS W/ CERVIX, FALLOPIAN TUBES, BILATERAL, OVARIES, BILATERAL, HYSTERECTOMY: Cervix:           Unremarkable.           Negative for dysplasia or malignancy.       Endocervix:           Nabothian cysts.           Negative for hyperplasia, atypia or malignancy.       Endometrium:           Benign inactive endometrium.           Subserosal vessels with marked calcifications.           Negative for hyperplasia, atypia or malignancy.       Myometrium:           Leiomyomata, multiple, submucosal, intramural and subserosal.           Negative for malignancy.       Serosa:           Unremarkable.           Negative for malignancy.       Bilateral fallopian tubes:           Benign fimbriated fallopian  tubes with paratubal cysts.           Negative for malignancy.       Bilateral ovaries:           Benign ovarian parenchyma with inclusion cysts.           Negative for malignancy.   Medications: She has a current medication list which includes the following prescription(s): amlodipine , black elderberry(berry-flower), vitamin d3, lisinopril , multivitamin with minerals, rosuvastatin , acetaminophen , ibuprofen , and oxycodone .   Allergies: Patient has no known allergies.   Physical Exam: BP 110/83   Pulse 70   Abdomen: soft, non-tender, without masses or organomegaly Laparoscopic Incisions: healing well.  Pelvic Examination: small area at perineum that is still healing with suture.  Vagina: Incisions healing well. Sutures are present at the cuff and there is not granulation tissue. No tenderness along the anterior or posterior vagina. No apical tenderness. No pelvic masses. No visible or palpable mesh.  POP-Q: POP-Q  -2  Aa   -2                                           Ba  -6                                              C   2.5                                            Gh  4.5                                            Pb  6                                            tvl   -2.5                                            Ap  -2.5                                            Bp                                                 D    ---------------------------------------------------------  Assessment and Plan:  1. Urge incontinence   2. Post-operative state     - Pathology results were reviewed with the patient today and she verbalized understanding that the results were benign.  - Can resume regular activity including exercise. Discussed avoidance of heavy lifting and straining long term to reduce the risk of recurrence. Wait an additional 6 weeks prior to intercourse.  - Will reexamine in a month and reassess leakage at that  time. She desires referral to pelvic PT.   All questions answered.   Return in about 1 month (around 04/24/2024).  Cynthia LOISE Caper, MD

## 2024-04-30 ENCOUNTER — Ambulatory Visit (INDEPENDENT_AMBULATORY_CARE_PROVIDER_SITE_OTHER): Admitting: Obstetrics and Gynecology

## 2024-04-30 ENCOUNTER — Encounter: Payer: Self-pay | Admitting: Obstetrics and Gynecology

## 2024-04-30 VITALS — BP 126/88 | HR 68

## 2024-04-30 DIAGNOSIS — Z9889 Other specified postprocedural states: Secondary | ICD-10-CM

## 2024-04-30 DIAGNOSIS — Z48816 Encounter for surgical aftercare following surgery on the genitourinary system: Secondary | ICD-10-CM

## 2024-04-30 DIAGNOSIS — N3281 Overactive bladder: Secondary | ICD-10-CM

## 2024-04-30 NOTE — Progress Notes (Signed)
 [facility]  Date of Visit: 04/30/2024  History of Present Illness: [name] is a 78 y.o. female scheduled today for a post-operative visit.   Surgery: s/p Robotic assisted total laparoscopic hysterectomy with bilateral salpingo-oophorectomy, sacrocolpopexy (vertessa lite Y), cystoscopy, perineorrhaphy on 02/02/24  She did not pass her postoperative void trial. She returned 02/05/24 and passed her voiding trial.   She is doing well. She lost the number for pelvic PT so hast not yet made an appointment. She reports occasional constipation. Also has had a few episodes a week of urge incontinence- she feels she has to go then dribbles on the way to the bathroom.    Medications: She has a current medication list which includes the following prescription(s): amlodipine , black elderberry(berry-flower), vitamin d3, ibuprofen , lisinopril , multivitamin with minerals, and rosuvastatin .   Allergies: Patient has no known allergies.   Physical Exam: BP 126/88   Pulse 68    Pelvic Examination: Perineum well healed.   Vagina: Incisions healing well. Small suture remnant noted and removed but otherwise no sutures present at the cuff and it is well healed. . No tenderness along the anterior or posterior vagina. No apical tenderness. No pelvic masses. No visible or palpable mesh.   ---------------------------------------------------------  Assessment and Plan:  1. Overactive bladder   2. Post-operative state      - Does not want to start a medication for leakage. She prefers more natural supplements. Recommended pumpkin seed oil and pelvic PT. Pt provided with number to call for PT.  - Can resume regular activity including exercise. Discussed avoidance of heavy lifting and straining long term to reduce the risk of recurrence. Wait an additional 2 weeks prior to intercourse.   Return as needed  [name], MD

## 2024-05-13 DIAGNOSIS — M8580 Other specified disorders of bone density and structure, unspecified site: Secondary | ICD-10-CM | POA: Diagnosis not present

## 2024-05-13 DIAGNOSIS — Z8249 Family history of ischemic heart disease and other diseases of the circulatory system: Secondary | ICD-10-CM | POA: Diagnosis not present

## 2024-05-13 DIAGNOSIS — N182 Chronic kidney disease, stage 2 (mild): Secondary | ICD-10-CM | POA: Diagnosis not present

## 2024-05-13 DIAGNOSIS — N939 Abnormal uterine and vaginal bleeding, unspecified: Secondary | ICD-10-CM | POA: Diagnosis not present

## 2024-05-13 DIAGNOSIS — Z6837 Body mass index (BMI) 37.0-37.9, adult: Secondary | ICD-10-CM | POA: Diagnosis not present

## 2024-05-13 DIAGNOSIS — E785 Hyperlipidemia, unspecified: Secondary | ICD-10-CM | POA: Diagnosis not present

## 2024-05-13 DIAGNOSIS — I129 Hypertensive chronic kidney disease with stage 1 through stage 4 chronic kidney disease, or unspecified chronic kidney disease: Secondary | ICD-10-CM | POA: Diagnosis not present

## 2024-05-13 DIAGNOSIS — N393 Stress incontinence (female) (male): Secondary | ICD-10-CM | POA: Diagnosis not present

## 2024-05-27 DIAGNOSIS — H40013 Open angle with borderline findings, low risk, bilateral: Secondary | ICD-10-CM | POA: Diagnosis not present

## 2024-05-27 DIAGNOSIS — H25813 Combined forms of age-related cataract, bilateral: Secondary | ICD-10-CM | POA: Diagnosis not present

## 2024-06-11 ENCOUNTER — Telehealth: Payer: Self-pay | Admitting: Pharmacist

## 2024-06-11 MED ORDER — ROSUVASTATIN CALCIUM 5 MG PO TABS: 5.0000 mg | ORAL_TABLET | Freq: Every day | ORAL | 3 refills | Status: AC

## 2024-06-11 NOTE — Progress Notes (Signed)
 Patient ID: Cynthia Barton, female   DOB: [birth year], 78 y.o.   MRN: [id number]   Pharmacy Quality Measure Review  This patient is appearing on a report for being at risk of failing the adherence measure for cholesterol (statin) medications this calendar year.   Medication: Rosuvastatin  5 mg  Last fill date: 07/25 for 90 day supply  Left voicemail for patient to return my call at their convenience. and Will collaborate with provider to facilitate refill needs.  Prescription expired 04/27/2024 will send new/refill co-signature required.   Cassius DOROTHA Brought, PharmD, BCACP Clinical Pharmacist [phone]

## 2024-08-25 ENCOUNTER — Ambulatory Visit: Payer: Self-pay | Admitting: Internal Medicine

## 2024-10-06 ENCOUNTER — Ambulatory Visit: Payer: Self-pay

## 2024-10-06 ENCOUNTER — Ambulatory Visit

## 2025-03-01 ENCOUNTER — Encounter: Payer: Self-pay | Admitting: Internal Medicine
